# Patient Record
Sex: Female | Born: 1990
Health system: Southern US, Community
[De-identification: ages and names within clinical notes are randomized; demographics above are authoritative.]

## PROBLEM LIST (undated history)

## (undated) DIAGNOSIS — M797 Fibromyalgia: Secondary | ICD-10-CM

## (undated) DIAGNOSIS — K259 Gastric ulcer, unspecified as acute or chronic, without hemorrhage or perforation: Secondary | ICD-10-CM

## (undated) DIAGNOSIS — F909 Attention-deficit hyperactivity disorder, unspecified type: Secondary | ICD-10-CM

## (undated) DIAGNOSIS — F419 Anxiety disorder, unspecified: Secondary | ICD-10-CM

## (undated) DIAGNOSIS — K219 Gastro-esophageal reflux disease without esophagitis: Secondary | ICD-10-CM

## (undated) DIAGNOSIS — R230 Cyanosis: Secondary | ICD-10-CM

## (undated) DIAGNOSIS — H4710 Unspecified papilledema: Secondary | ICD-10-CM

## (undated) DIAGNOSIS — Z9889 Other specified postprocedural states: Secondary | ICD-10-CM

## (undated) DIAGNOSIS — F32A Depression, unspecified: Secondary | ICD-10-CM

## (undated) DIAGNOSIS — F329 Major depressive disorder, single episode, unspecified: Secondary | ICD-10-CM

## (undated) DIAGNOSIS — F429 Obsessive-compulsive disorder, unspecified: Secondary | ICD-10-CM

## (undated) DIAGNOSIS — R112 Nausea with vomiting, unspecified: Secondary | ICD-10-CM

## (undated) DIAGNOSIS — T7840XA Allergy, unspecified, initial encounter: Secondary | ICD-10-CM

## (undated) DIAGNOSIS — E282 Polycystic ovarian syndrome: Secondary | ICD-10-CM

## (undated) HISTORY — DX: Allergy, unspecified, initial encounter: T78.40XA

## (undated) HISTORY — DX: Anxiety disorder, unspecified: F41.9

---
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
## 2012-06-02 ENCOUNTER — Other Ambulatory Visit: Payer: Self-pay | Admitting: Gastroenterology

## 2012-06-07 ENCOUNTER — Encounter (HOSPITAL_COMMUNITY): Payer: Self-pay

## 2012-06-08 ENCOUNTER — Encounter (HOSPITAL_COMMUNITY): Payer: Self-pay | Admitting: Gastroenterology

## 2012-06-08 ENCOUNTER — Ambulatory Visit (HOSPITAL_COMMUNITY)
Admission: RE | Admit: 2012-06-08 | Discharge: 2012-06-08 | Disposition: A | Discharge status: Home / Self Care | Payer: BC Managed Care – PPO | Source: Ambulatory Visit | Attending: Gastroenterology | Admitting: Gastroenterology

## 2012-06-08 ENCOUNTER — Encounter (HOSPITAL_COMMUNITY)
Admission: RE | Disposition: A | Discharge status: Home / Self Care | Payer: Self-pay | Source: Ambulatory Visit | Attending: Gastroenterology

## 2012-06-08 ENCOUNTER — Encounter (HOSPITAL_COMMUNITY): Payer: Self-pay | Admitting: *Deleted

## 2012-06-08 ENCOUNTER — Encounter (HOSPITAL_COMMUNITY): Payer: Self-pay | Admitting: Anesthesiology

## 2012-06-08 ENCOUNTER — Ambulatory Visit (HOSPITAL_COMMUNITY): Payer: BC Managed Care – PPO | Admitting: Anesthesiology

## 2012-06-08 DIAGNOSIS — K921 Melena: Secondary | ICD-10-CM | POA: Insufficient documentation

## 2012-06-08 DIAGNOSIS — R1013 Epigastric pain: Secondary | ICD-10-CM | POA: Insufficient documentation

## 2012-06-08 DIAGNOSIS — K621 Rectal polyp: Secondary | ICD-10-CM | POA: Insufficient documentation

## 2012-06-08 DIAGNOSIS — K620 Anal polyp: Secondary | ICD-10-CM | POA: Insufficient documentation

## 2012-06-08 DIAGNOSIS — K644 Residual hemorrhoidal skin tags: Secondary | ICD-10-CM | POA: Insufficient documentation

## 2012-06-08 DIAGNOSIS — K648 Other hemorrhoids: Secondary | ICD-10-CM | POA: Insufficient documentation

## 2012-06-08 SURGERY — ESOPHAGOGASTRODUODENOSCOPY (EGD) WITH PROPOFOL
Anesthesia: Monitor Anesthesia Care

## 2012-06-08 MED ORDER — PROMETHAZINE HCL 25 MG/ML IJ SOLN
INTRAMUSCULAR | Status: DC | PRN
  Administered 2012-06-08: 12.5 mg via INTRAVENOUS

## 2012-06-08 MED ORDER — PROPOFOL 10 MG/ML IV BOLUS
INTRAVENOUS | Status: DC | PRN
Start: 2012-06-08 — End: 2012-06-08
  Administered 2012-06-08 (×3): 50 mg via INTRAVENOUS
  Administered 2012-06-08: 25 mg via INTRAVENOUS
  Administered 2012-06-08: 50 mg via INTRAVENOUS
  Administered 2012-06-08: 25 mg via INTRAVENOUS
  Administered 2012-06-08: 100 mg via INTRAVENOUS

## 2012-06-08 MED ORDER — SODIUM CHLORIDE 0.9 % IV SOLN
INTRAVENOUS | Status: DC | PRN
  Administered 2012-06-08: 14:00:00 via INTRAVENOUS

## 2012-06-08 MED ORDER — FENTANYL CITRATE 0.05 MG/ML IJ SOLN
INTRAMUSCULAR | Status: DC | PRN
  Administered 2012-06-08: 50 ug via INTRAVENOUS

## 2012-06-08 MED ORDER — PROMETHAZINE HCL 25 MG/ML IJ SOLN
INTRAMUSCULAR | Status: AC
  Filled 2012-06-08: qty 1

## 2012-06-08 MED ORDER — SODIUM CHLORIDE 0.9 % IV SOLN: INTRAVENOUS | Status: DC

## 2012-06-08 SURGICAL SUPPLY — 25 items

## 2012-06-08 NOTE — Op Note (Signed)
[facility] [address] [place], [address]   OPERATIVE PROCEDURE REPORT  PATIENT: Cassandra Ibarra, Cassandra Ibarra  MR#: [id number] BIRTHDATE: 1990-11-01  GENDER: Female ENDOSCOPIST: Jeani Hawking, MD ASSISTANT:   Beryle Beams, Technician Angelique Blonder, RN PROCEDURE DATE: 06/08/2012 PROCEDURE:   Colonoscopy with snare polypectomy ASA CLASS:   Class II INDICATIONS:hematochezia and heme-positive stool. MEDICATIONS: See Anesthesia Report.  DESCRIPTION OF PROCEDURE:   After the risks benefits and alternatives of the procedure were thoroughly explained, informed consent was obtained.  A digital rectal exam revealed no abnormalities of the rectum.    The Pentax Colonoscope [id number] endoscope was introduced through the anus  and advanced to the terminal ileum which was intubated for a short distance , No adverse events experienced.    The quality of the prep was good. . The instrument was then slowly withdrawn as the colon was fully examined.     COLON FINDINGS: A small 2 mm sessile polyp was removed with a cold snare from the rectum.  No evidence of any inflammation, ulcerations, erosions, or vascular abnormalities.   Retroflexed views revealed internal/external hemorrhoids.     The scope was then withdrawn from the patient and the procedure terminated.  COMPLICATIONS: There were no complications.  IMPRESSION: 1) 2 mm sessile rectal polyp.  RECOMMENDATIONS:1) Await biopsy results.   _______________________________ eSignedJeani Hawking, MD 06/08/2012 2:55 PM

## 2012-06-08 NOTE — Op Note (Signed)
[facility] [address] [place], [address]   OPERATIVE PROCEDURE REPORT  PATIENT: Cassandra Ibarra, Cassandra Ibarra  MR#: [id number] BIRTHDATE: 26-Oct-1990  GENDER: Female ENDOSCOPIST: Jeani Hawking, MD ASSISTANT:   Beryle Beams, Technician and Angelique Blonder, RN PROCEDURE DATE: 06/08/2012 PROCEDURE:   EGD, diagnostic ASA CLASS:   Class II INDICATIONS:epigastric abdominal pain. MEDICATIONS: See Anesthesia Report. TOPICAL ANESTHETIC:   none  DESCRIPTION OF PROCEDURE:   After the risks benefits and alternatives of the procedure were thoroughly explained, informed consent was obtained.  The [id number]  endoscope was introduced through the mouth  and advanced to the second portion of the duodenum Without limitations.      The instrument was slowly withdrawn as the mucosa was fully examined.      FINDINGS: In the distal esophagus there was minimal evidence of any esophagitis that may be secondary to her nausea/vomiting.  The gastric mucosa was normal, however, in the duodenal bulb there was an 8 mm to 10 mm nonbleeding clean-based ulcer.   Retroflexed views revealed no abnormalities.     The scope was then withdrawn from the patient and the procedure terminated.  COMPLICATIONS: There were no complications. IMPRESSION:  1.Ulcer(s) were found  RECOMMENDATIONS: 1) PPI BID. 2) Sucralfate QID. 3) Follow up in one month.   _______________________________ eSigned:  Jeani Hawking, MD 06/08/2012 3:02 PM

## 2012-06-08 NOTE — Anesthesia Preprocedure Evaluation (Signed)
Anesthesia Evaluation  Patient identified by MRN, date of birth, ID band Patient awake    Reviewed: Allergy & Precautions, H&P , NPO status , Patient's Chart, lab work & pertinent test results  Airway Mallampati: II TM Distance: <3 FB Neck ROM: Full    Dental No notable dental hx.    Pulmonary neg pulmonary ROS,  breath sounds clear to auscultation  Pulmonary exam normal       Cardiovascular negative cardio ROS  Rhythm:Regular Rate:Normal     Neuro/Psych negative neurological ROS  negative psych ROS   GI/Hepatic negative GI ROS, Neg liver ROS,   Endo/Other  diabetes, Oral Hypoglycemic AgentsMorbid obesity  Renal/GU negative Renal ROS  negative genitourinary   Musculoskeletal negative musculoskeletal ROS (+)   Abdominal   Peds negative pediatric ROS (+)  Hematology negative hematology ROS (+)   Anesthesia Other Findings   Reproductive/Obstetrics negative OB ROS                           Anesthesia Physical Anesthesia Plan  ASA: III  Anesthesia Plan: MAC   Post-op Pain Management:    Induction:   Airway Management Planned: Simple Face Mask  Additional Equipment:   Intra-op Plan:   Post-operative Plan:   Informed Consent: I have reviewed the patients History and Physical, chart, labs and discussed the procedure including the risks, benefits and alternatives for the proposed anesthesia with the patient or authorized representative who has indicated his/her understanding and acceptance.     Plan Discussed with: CRNA and Surgeon  Anesthesia Plan Comments:         Anesthesia Quick Evaluation

## 2012-06-08 NOTE — H&P (Signed)
  Reason for Consult: Hematochezia and epigastric pain Referring Physician: Lenard Lance, M.D.   Ledell Peoples HPI: This is a 21 year female with complaints of acute hematochezia.  Her symptoms started last week and she did not think much of it, but then she had more bleeding.  As a result of her symptoms she presented to the [facility].  Her blood work revealed a WBC of 16,000.  She was noted to have epigastric tenderness and she reports having nausea and GERD.  No known family history of Crohn's disease.  Past Medical History  Diagnosis Date  . PCOS (polycystic ovarian syndrome)   . Depression     History reviewed. No pertinent past surgical history.  History reviewed. No pertinent family history.  Social History:  reports that she has been smoking.  She does not have any smokeless tobacco history on file. She reports that she drinks alcohol. She reports that she does not use illicit drugs.  Allergies: No Known Allergies  Medications: Scheduled:   Continuous:    Results for orders placed during the hospital encounter of 06/08/12 (from the past 24 hour(s))  GLUCOSE, CAPILLARY     Status: Normal   Collection Time   06/08/12  1:57 PM      Component Value Range   Glucose-Capillary 85  70 - 99 mg/dL     No results found.  ROS:  As stated above in the HPI otherwise negative.  Blood pressure 164/113, temperature 98 F (36.7 C), temperature source Oral, resp. rate 21, height 5' 7.5" (1.715 m), weight 149.687 kg (330 lb), last menstrual period 05/23/2012, SpO2 98.00%.    PE: Gen: NAD, Alert and Oriented HEENT:  [place]/AT, EOMI Neck: Supple, no LAD Lungs: CTA Bilaterally CV: RRR without M/G/R ABM: Soft, NTND, +BS Ext: No C/C/E  Assessment/Plan: 1) Hematochezia. 2) Epigastric pain.  Plan: 1) EGD/Colonoscopy.  Doral Ventrella D 06/08/2012, 2:06 PM

## 2012-06-08 NOTE — Transfer of Care (Signed)
Immediate Anesthesia Transfer of Care Note  Patient: Cassandra Ibarra  Procedure(s) Performed: Procedure(s) (LRB) with comments: ESOPHAGOGASTRODUODENOSCOPY (EGD) WITH PROPOFOL (N/A) COLONOSCOPY WITH PROPOFOL (N/A)  Patient Location: PACU  Anesthesia Type: MAC  Level of Consciousness: awake, sedated and patient cooperative  Airway & Oxygen Therapy: Patient Spontanous Breathing and Patient connected to nasal cannula oxygen  Post-op Assessment: Report given to PACU RN and Post -op Vital signs reviewed and stable  Post vital signs: Reviewed and stable  Complications: No apparent anesthesia complications

## 2012-06-09 NOTE — Anesthesia Postprocedure Evaluation (Signed)
  Anesthesia Post-op Note  Patient: Cassandra Ibarra  Procedure(s) Performed: Procedure(s) (LRB): ESOPHAGOGASTRODUODENOSCOPY (EGD) WITH PROPOFOL (N/A) COLONOSCOPY WITH PROPOFOL (N/A)  Patient Location: PACU  Anesthesia Type: MAC  Level of Consciousness: awake and alert   Airway and Oxygen Therapy: Patient Spontanous Breathing  Post-op Pain: mild  Post-op Assessment: Post-op Vital signs reviewed, Patient's Cardiovascular Status Stable, Respiratory Function Stable, Patent Airway and No signs of Nausea or vomiting  Post-op Vital Signs: stable  Complications: No apparent anesthesia complications

## 2012-08-01 ENCOUNTER — Ambulatory Visit (INDEPENDENT_AMBULATORY_CARE_PROVIDER_SITE_OTHER): Payer: BC Managed Care – PPO | Admitting: Emergency Medicine

## 2012-08-01 ENCOUNTER — Encounter: Payer: Self-pay | Admitting: Emergency Medicine

## 2012-08-01 VITALS — BP 126/90 | HR 122 | Temp 97.7°F | Resp 20 | Ht 67.0 in | Wt 337.0 lb

## 2012-08-01 DIAGNOSIS — R230 Cyanosis: Secondary | ICD-10-CM

## 2012-08-01 DIAGNOSIS — I7300 Raynaud's syndrome without gangrene: Secondary | ICD-10-CM

## 2012-08-01 HISTORY — DX: Cyanosis: R23.0

## 2012-08-01 LAB — POCT UA - MICROSCOPIC ONLY
Casts, Ur, LPF, POC: NEGATIVE
Mucus, UA: POSITIVE
Yeast, UA: NEGATIVE

## 2012-08-01 LAB — POCT URINALYSIS DIPSTICK
Bilirubin, UA: NEGATIVE
Ketones, UA: NEGATIVE
Leukocytes, UA: NEGATIVE
Nitrite, UA: NEGATIVE
pH, UA: 5.5

## 2012-08-01 LAB — POCT CBC
HCT, POC: 44.4 % (ref 37.7–47.9)
Hemoglobin: 13.8 g/dL (ref 12.2–16.2)
MPV: 8.1 fL (ref 0–99.8)
POC Granulocyte: 5.9 (ref 2–6.9)
POC MID %: 5.8 %M (ref 0–12)
RBC: 4.98 M/uL (ref 4.04–5.48)
WBC: 12.4 10*3/uL — AB (ref 4.6–10.2)

## 2012-08-01 MED ORDER — LORAZEPAM 1 MG PO TABS: 1.0000 mg | ORAL_TABLET | Freq: Two times a day (BID) | ORAL | Status: DC | PRN

## 2012-08-01 NOTE — Progress Notes (Signed)
[facility] [address], [place] [address] [phone]  Date:  08/01/2012   Name:  Cassandra Ibarra   DOB:  05-20-1991   MRN:  [id number]  PCP:  Pcp Not In System    Chief Complaint: No chief complaint on file.   History of Present Illness:  Cassandra Ibarra is a 21 y.o. 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  She was really asymptomatic other than having anxiety.  She smoked a hookah last night but is not a habitual tobacco user.  Not using any medication in past 30 days.  Is prescribed metformin and prilosec but has not been taking them in over a month.  She has not been exposed to any chemicals.  Has no pain in her hands or fingers.  Denies any history of inflammatory joint or connective tissue disorder.  No other complaint.  No shortness of breath Has been very anxious during the recent past.  Sleeping ok.  No depression  There is no problem list on file for this patient.   Past Medical History  Diagnosis Date  . PCOS (polycystic ovarian syndrome)   . Depression     No past surgical history on file.  History  Substance Use Topics  . Smoking status: Current Some Day Smoker  . Smokeless tobacco: Not on file  . Alcohol Use: Yes     Comment: 3 beers a week     No family history on file.  No Known Allergies  Medication list has been reviewed and updated.  Current Outpatient Prescriptions on File Prior to Visit  Medication Sig Dispense Refill  . metFORMIN (GLUCOPHAGE) 1000 MG tablet Take 1,000 mg by mouth 2 (two) times daily with a meal.      . promethazine (PHENERGAN) 12.5 MG tablet Take 12.5 mg by mouth every 6 (six) hours as needed.        Review of Systems:  As per HPI, otherwise negative.    Physical Examination: There were no vitals filed for this visit. There were no vitals filed for this visit. There is no height or weight on file  to calculate BMI. Ideal Body Weight:    GEN: WDWN, NAD, Non-toxic, A & O x 3 HEENT: Atraumatic, Normocephalic. Neck supple. No masses, No LAD. Ears and Nose: No external deformity. CV: RRR, No M/G/R. No JVD. No thrill. No extra heart sounds. PULM: CTA B, no wheezes, crackles, rhonchi. No retractions. No resp. distress. No accessory muscle use. ABD: S, NT, ND, +BS. No rebound. No HSM. EXTR: No c/e.  Cyanosis of hands from mid palm distally and of forefoot and toes. NEURO Normal gait.  PSYCH: Normally interactive. Conversant. Not depressed or anxious appearing.  Calm demeanor.    Assessment and Plan: Raynaud's phenomena anxiety Labs Refer to rheumatology  Patient's cyanosis improved dramatically with exposure to warm water.  Carmelina Dane, MD

## 2012-08-01 NOTE — Patient Instructions (Addendum)
Raynaud's Syndrome  Raynaud's Syndrome is a disorder of the blood vessels in your hands and feet. It occurs when small arteries of the arms/hands or legs/feet become sensitive to cold or emotional upset. This causes the arteries to constrict, or narrow, and reduces blood flow to the area. The color in the fingers or toes changes from white to bluish to red and this is not usually painful. There may be numbness and tingling. Sores on the skin (ulcers) can form. Symptoms are usually relieved by warming.  HOME CARE INSTRUCTIONS     Avoid exposure to cold. Keep your whole body warm and dry. Dress in layers. Wear mittens or gloves when handling ice or frozen food and when outdoors. Use holders for glasses or cans containing cold drinks. If possible, stay indoors during cold weather.   Limit your use of caffeine. Switch to decaffeinated coffee, tea, and soda pop. Avoid chocolate.   Avoid smoking or being around cigarette smoke. Smoke will make symptoms worse.   Wear loose fitting socks and comfortable, roomy shoes.   Avoid vibrating tools and machinery.   If possible, avoid stressful and emotional situations. Exercise, meditation and yoga may help you cope with stress. Biofeedback may be useful.   Ask your caregiver about medicine (calcium channel blockers) that may control Raynaud's phenomena.  SEEK MEDICAL CARE IF:     Your discomfort becomes worse, despite conservative treatment.   You develop sores on your fingers and toes that do not heal.  Document Released: 08/15/2000 Document Revised: 11/10/2011 Document Reviewed: 08/22/2008  [employer] Patient Information 2013 [employer]

## 2012-08-02 ENCOUNTER — Telehealth: Payer: Self-pay

## 2012-08-02 LAB — COMPREHENSIVE METABOLIC PANEL
ALT: 27 U/L (ref 0–35)
Albumin: 4.4 g/dL (ref 3.5–5.2)
CO2: 23 mEq/L (ref 19–32)
Calcium: 9.4 mg/dL (ref 8.4–10.5)
Chloride: 106 mEq/L (ref 96–112)
Glucose, Bld: 91 mg/dL (ref 70–99)
Sodium: 139 mEq/L (ref 135–145)
Total Protein: 7.4 g/dL (ref 6.0–8.3)

## 2012-08-02 LAB — RHEUMATOID FACTOR: Rhuematoid fact SerPl-aCnc: <10 IU/mL (ref <=14)

## 2012-08-02 NOTE — Telephone Encounter (Signed)
I have spoken to patient she states she now has tingling in both her hands. She states her hands are not red or swollen/ she had her mother look at them as well. She is advised if worse to return to clinic, she agrees to do this. Please advise if anything else needed other than referral to rheumatology. Amy

## 2012-08-02 NOTE — Telephone Encounter (Signed)
PATIENT WAS SEEN YESTERDAY AND HAS NEW SYMPTOMS. HANDS ARE NOW NOT BLUE ANYMORE. A VEIN IN BOTH ARMS FEEL TINGLY AND IS HOT.  [phone]. PLEASE CALL IMMEDIATELY PATIENT REQUEST TO SPEAK TO A NURSE OR SOMEONE CLINICAL IN REGARDS TO SYMPTOMS. THANK YOU!

## 2012-08-03 LAB — ANA: Anti Nuclear Antibody(ANA): NEGATIVE

## 2012-08-05 ENCOUNTER — Telehealth: Payer: Self-pay

## 2012-08-05 NOTE — Telephone Encounter (Signed)
Patient has the bloods completed.

## 2012-08-05 NOTE — Telephone Encounter (Signed)
Patient needs to have the following labs before [facility] will schedule the patient for our referral:   C3, C4, ruematoid factor, ANA [phone]

## 2012-08-05 NOTE — Telephone Encounter (Signed)
Pt is calling about lab results  

## 2012-08-06 ENCOUNTER — Telehealth: Payer: Self-pay

## 2012-08-06 NOTE — Telephone Encounter (Signed)
Spoke w/pt and gave her lab results and faxed copy to her rheumatologist.

## 2012-08-06 NOTE — Telephone Encounter (Signed)
Pt had all these labs completed, and I spoke w/pt to give her results and faxed copy to rheumatologist.

## 2012-08-06 NOTE — Telephone Encounter (Signed)
Pt has questions about her referral and blood work she is supposed to be doing would like a call back from a nurse [phone]

## 2012-08-09 NOTE — Progress Notes (Signed)
Reviewed and agree.

## 2012-08-13 ENCOUNTER — Emergency Department (HOSPITAL_COMMUNITY): Payer: BC Managed Care – PPO

## 2012-08-13 ENCOUNTER — Emergency Department (HOSPITAL_COMMUNITY)
Admission: EM | Admit: 2012-08-13 | Discharge: 2012-08-13 | Disposition: A | Discharge status: Home / Self Care | Payer: BC Managed Care – PPO | Attending: Emergency Medicine | Admitting: Emergency Medicine

## 2012-08-13 ENCOUNTER — Other Ambulatory Visit: Payer: Self-pay

## 2012-08-13 ENCOUNTER — Encounter (HOSPITAL_COMMUNITY): Payer: Self-pay | Admitting: Emergency Medicine

## 2012-08-13 DIAGNOSIS — Z79899 Other long term (current) drug therapy: Secondary | ICD-10-CM | POA: Insufficient documentation

## 2012-08-13 DIAGNOSIS — R5381 Other malaise: Secondary | ICD-10-CM | POA: Insufficient documentation

## 2012-08-13 DIAGNOSIS — Z8742 Personal history of other diseases of the female genital tract: Secondary | ICD-10-CM | POA: Insufficient documentation

## 2012-08-13 DIAGNOSIS — G43909 Migraine, unspecified, not intractable, without status migrainosus: Secondary | ICD-10-CM | POA: Insufficient documentation

## 2012-08-13 DIAGNOSIS — R110 Nausea: Secondary | ICD-10-CM | POA: Insufficient documentation

## 2012-08-13 DIAGNOSIS — R5383 Other fatigue: Secondary | ICD-10-CM | POA: Insufficient documentation

## 2012-08-13 DIAGNOSIS — F17200 Nicotine dependence, unspecified, uncomplicated: Secondary | ICD-10-CM | POA: Insufficient documentation

## 2012-08-13 DIAGNOSIS — Z872 Personal history of diseases of the skin and subcutaneous tissue: Secondary | ICD-10-CM | POA: Insufficient documentation

## 2012-08-13 DIAGNOSIS — R51 Headache: Secondary | ICD-10-CM | POA: Insufficient documentation

## 2012-08-13 DIAGNOSIS — Z8659 Personal history of other mental and behavioral disorders: Secondary | ICD-10-CM | POA: Insufficient documentation

## 2012-08-13 DIAGNOSIS — R230 Cyanosis: Secondary | ICD-10-CM | POA: Insufficient documentation

## 2012-08-13 LAB — COMPREHENSIVE METABOLIC PANEL
BUN: 10 mg/dL (ref 6–23)
CO2: 23 mEq/L (ref 19–32)
Calcium: 9.6 mg/dL (ref 8.4–10.5)
Creatinine, Ser: 0.7 mg/dL (ref 0.50–1.10)
GFR calc Af Amer: >90 mL/min (ref >90)
GFR calc non Af Amer: >90 mL/min (ref >90)
Glucose, Bld: 86 mg/dL (ref 70–99)

## 2012-08-13 LAB — CBC WITH DIFFERENTIAL/PLATELET
Eosinophils Relative: 3 % (ref 0–5)
HCT: 38.8 % (ref 36.0–46.0)
Lymphocytes Relative: 42 % (ref 12–46)
Lymphs Abs: 5.2 10*3/uL — ABNORMAL HIGH (ref 0.7–4.0)
MCV: 82.9 fL (ref 78.0–100.0)
Monocytes Absolute: 0.7 10*3/uL (ref 0.1–1.0)
Monocytes Relative: 5 % (ref 3–12)
RBC: 4.68 MIL/uL (ref 3.87–5.11)
RDW: 12.9 % (ref 11.5–15.5)
WBC: 12.5 10*3/uL — ABNORMAL HIGH (ref 4.0–10.5)

## 2012-08-13 MED ORDER — KETOROLAC TROMETHAMINE 30 MG/ML IJ SOLN
30.0000 mg | Freq: Once | INTRAMUSCULAR | Status: AC
  Administered 2012-08-13: 30 mg via INTRAVENOUS
  Filled 2012-08-13: qty 1

## 2012-08-13 MED ORDER — IOHEXOL 350 MG/ML SOLN
100.0000 mL | Freq: Once | INTRAVENOUS | Status: AC | PRN
  Administered 2012-08-13: 100 mL via INTRAVENOUS

## 2012-08-13 MED ORDER — ONDANSETRON HCL 4 MG/2ML IJ SOLN
4.0000 mg | Freq: Once | INTRAMUSCULAR | Status: AC
  Administered 2012-08-13: 4 mg via INTRAVENOUS
  Filled 2012-08-13: qty 2

## 2012-08-13 MED ORDER — METOCLOPRAMIDE HCL 5 MG/ML IJ SOLN
10.0000 mg | INTRAMUSCULAR | Status: AC
  Administered 2012-08-13: 10 mg via INTRAVENOUS
  Filled 2012-08-13: qty 2

## 2012-08-13 MED ORDER — DIPHENHYDRAMINE HCL 50 MG/ML IJ SOLN
25.0000 mg | Freq: Once | INTRAMUSCULAR | Status: AC
  Administered 2012-08-13: 25 mg via INTRAVENOUS
  Filled 2012-08-13: qty 1

## 2012-08-13 MED ORDER — MORPHINE SULFATE 4 MG/ML IJ SOLN
4.0000 mg | Freq: Once | INTRAMUSCULAR | Status: AC
Start: 2012-08-13 — End: 2012-08-13
  Administered 2012-08-13: 4 mg via INTRAVENOUS
  Filled 2012-08-13: qty 1

## 2012-08-13 MED ORDER — HYDROCODONE-ACETAMINOPHEN 5-325 MG PO TABS: 1.0000 | ORAL_TABLET | ORAL | Status: DC | PRN

## 2012-08-13 MED ORDER — IOHEXOL 300 MG/ML  SOLN: 20.0000 mL | INTRAMUSCULAR | Status: AC

## 2012-08-13 NOTE — ED Notes (Signed)
Fingers are pale and do not blanch hands have  Good radial pulse to both hand can wiggle fingers

## 2012-08-13 NOTE — ED Provider Notes (Signed)
Medical screening examination/treatment/procedure(s) were performed by non-physician practitioner and as supervising physician I was immediately available for consultation/collaboration.   Charles B. Bernette Mayers, MD 08/13/12 2244

## 2012-08-13 NOTE — ED Notes (Addendum)
PT reports hxt of migraines and duodenal ulcer. Reports current migraine for the past 24 hrs. Light sensitivity. Reports nausea.  Denies headache post morphine. States "there is no pain, but I feel constant pressure". Patient's finger tips and toes are blue bilaterally. States they are not painful after receiving morphine. Denies SOB. Denies Chest pain. Denies N/V/D. Reports being under increased stress related to final exams. Family at bedside. Updated on plan of care. Aware.

## 2012-08-13 NOTE — ED Provider Notes (Signed)
History     CSN: [id number]  Arrival date & time 08/13/12  1429   First MD Initiated Contact with Patient 08/13/12 2129      No chief complaint on file.   (Consider location/radiation/quality/duration/timing/severity/associated sxs/prior treatment) The history is provided by the patient, a parent, a relative, a friend and medical records.    Cassandra Ibarra is a 21 y.o. female  with a hx of PCOS, Depression, migraine presents to the Emergency Department complaining of gradual, intermittent, progressively worsening onset Dec 1st.  The first episode resolved within 2 hours.  Pt has had 3-4 episodes per week since that time.  Associated symptoms include pain in the hands, headaches, lightheaded.  Nothing makes it better and nothing makes it worse.  Pt denies fever, chills, chest pain, shortness of breath, abdominal pain, nausea, vomiting, diarrhea, constipation, dysuria, hematuria, frequency, urgency, syncope, palpitations.  Pt was seen on 08/01/12 for this and dx with Raynaud's.  She was then referred to Rheum on who she saw on Wed. He said not Raynaud's and suggested cardiology.  She was seen at [facility] cardiology for an echo but has not gotten the results.   Pt also c/o migraine since yesterday same as the ones she has had in the past month. Normal migraine in the back of the head; these now begin at the hairline and is on the R for the past month.  Pt also c/o generalized weakness, not correlated in the migraines.    Past Medical History  Diagnosis Date  . PCOS (polycystic ovarian syndrome)   . Depression     History reviewed. No pertinent past surgical history.  No family history on file.  History  Substance Use Topics  . Smoking status: Current Some Day Smoker  . Smokeless tobacco: Not on file  . Alcohol Use: Yes     Comment: 3 beers a week     OB History    Grav Para Term Preterm Abortions TAB SAB Ect Mult Living                  Review of Systems  Constitutional:  Positive for fatigue. Negative for fever, diaphoresis, appetite change and unexpected weight change.  HENT: Negative for mouth sores and neck stiffness.   Eyes: Negative for visual disturbance.  Respiratory: Negative for cough, chest tightness, shortness of breath and wheezing.   Cardiovascular: Negative for chest pain.  Gastrointestinal: Negative for nausea, vomiting, abdominal pain, diarrhea and constipation.  Genitourinary: Negative for dysuria, urgency, frequency and hematuria.  Skin: Negative for rash.       Blue hands  Neurological: Positive for headaches. Negative for syncope and light-headedness.  Psychiatric/Behavioral: Negative for sleep disturbance. The patient is not nervous/anxious.   All other systems reviewed and are negative.    Allergies  Lorazepam  Home Medications   Current Outpatient Rx  Name  Route  Sig  Dispense  Refill  . ACETAMINOPHEN-ASPIRIN BUFFERED 250-250 MG PO TABS   Oral   Take 1 tablet by mouth every 4 (four) hours as needed. For pain         . ASPIRIN-ACETAMINOPHEN-CAFFEINE 250-250-65 MG PO TABS   Oral   Take 1 tablet by mouth every 6 (six) hours as needed. For migraines           BP 130/84  Pulse 110  Temp 97.4 F (36.3 C) (Oral)  Resp 16  SpO2 98%  LMP 07/24/2012  Physical Exam  Nursing note and vitals reviewed. Constitutional: She  is oriented to person, place, and time. She appears well-developed and well-nourished. No distress.  HENT:  Head: Normocephalic and atraumatic.  Right Ear: Tympanic membrane, external ear and ear canal normal.  Left Ear: Tympanic membrane, external ear and ear canal normal.  Nose: Nose normal. Right sinus exhibits no maxillary sinus tenderness and no frontal sinus tenderness. Left sinus exhibits no maxillary sinus tenderness and no frontal sinus tenderness.  Mouth/Throat: Uvula is midline, oropharynx is clear and moist and mucous membranes are normal. No oropharyngeal exudate, posterior oropharyngeal  edema, posterior oropharyngeal erythema or tonsillar abscesses.  Eyes: Conjunctivae normal and EOM are normal. Pupils are equal, round, and reactive to light. No scleral icterus.  Neck: Normal range of motion. Neck supple.  Cardiovascular: Regular rhythm, normal heart sounds and intact distal pulses.  Tachycardia present.  Exam reveals no gallop and no friction rub.   No murmur heard. Pulses:      Radial pulses are 2+ on the right side, and 2+ on the left side.       Dorsalis pedis pulses are 2+ on the right side, and 2+ on the left side.       Posterior tibial pulses are 2+ on the right side, and 2+ on the left side.  Pulmonary/Chest: Effort normal and breath sounds normal. No respiratory distress. She has no decreased breath sounds. She has no wheezes. She has no rhonchi. She has no rales. She exhibits no tenderness.  Abdominal: Soft. Bowel sounds are normal. She exhibits no distension and no mass. There is no tenderness. There is no rebound and no guarding.  Musculoskeletal: Normal range of motion. She exhibits no edema and no tenderness.  Lymphadenopathy:    She has no cervical adenopathy.  Neurological: She is alert and oriented to person, place, and time.       Speech is clear and goal oriented, follows commands Major Cranial nerves without deficit, no facial droop Normal strength in upper and lower extremities bilaterally including dorsiflexion and plantar flexion, strong and equal grip strength Sensation normal to light and sharp touch Moves extremities without ataxia, coordination intact Normal finger to nose and rapid alternating movements Neg romberg, no pronator drift Normal gait Normal heel-shin and balance  Skin: Skin is warm and dry. No rash noted. She is not diaphoretic. No erythema.       Mild cyanosis in patches in the hands and feet, hands and feet warm to touch, capillary refill less than 3 seconds, strong pulses palpable in all extremities  Psychiatric: She has a normal  mood and affect.    ED Course  Procedures (including critical care time)  Labs Reviewed  CBC WITH DIFFERENTIAL - Abnormal; Notable for the following:    WBC 12.5 (*)     Lymphs Abs 5.2 (*)     All other components within normal limits  COMPREHENSIVE METABOLIC PANEL - Abnormal; Notable for the following:    Total Bilirubin 0.2 (*)     All other components within normal limits  TROPONIN I   Dg Chest 2 View  08/13/2012  *RADIOLOGY REPORT*  Clinical Data: Chest pain.  CHEST - 2 VIEW  Comparison: October 18, 2011.  Findings: Cardiomediastinal silhouette appears normal.  No acute pulmonary disease is noted.  Bony thorax is intact.  IMPRESSION: No acute cardiopulmonary abnormality seen.   Original Report Authenticated By: Lupita Raider.,  M.D.    Ct Angio Chest W/cm &/or Wo Cm  08/13/2012  *RADIOLOGY REPORT*  Clinical Data: Bilateral upper  extremity pain and cyanosis. Clinical suspicion for thoracic aortic dissection.  CT ANGIOGRAPHY CHEST  Technique:  Multidetector CT imaging of the chest using the standard protocol during bolus administration of intravenous contrast. Multiplanar reconstructed images including MIPs were obtained and reviewed to evaluate the vascular anatomy.  Contrast: OMNIPAQUE IOHEXOL 350 MG/ML SOLN  Comparison: None.  Findings: No evidence of thoracic aortic aneurysm or dissection. No evidence of mediastinal hematoma or mass.  Residual thymic tissue is noted in the anterior mediastinum.  No lymphadenopathy identified within the thorax.  No evidence of pleural or pericardial effusion.  No pulmonary emboli identified.  No evidence of pulmonary infiltrate or mass. No central endobronchial lesion identified.  IMPRESSION: Negative.  No evidence of thoracic aortic aneurysm, dissection, or other significant abnormality.   Original Report Authenticated By: Myles Rosenthal, M.D.    ECG  Date: 08/13/2012  Rate: 128  Rhythm: sinus tachycardia  QRS Axis: normal  Intervals: normal   ST/T Wave abnormalities: nonspecific T wave changes  Conduction Disutrbances:none  Narrative Interpretation: inverted T waves V3-V6  Old EKG Reviewed: none available     1. Vasomotor cyanosis   2. Headache       MDM  Ledell Peoples presents complaining of sinuses in her hands and feet since December 1. Room or gout without results therefore she presented here for cardiac workup rheumatologist due to concern for pulmonary embolism.  ECG nonischemic, CMP within normal limits, troponin negative, CBC with mildly elevated white blood cell count of 12.5 which appears to be patient's baseline based on previous labs. CT angiogram without evidence of thoracic aortic aneurysm dissection or pulmonary embolism. Chest x-ray within normal limits. Normal neurological exam.  Mild cyanosis of the hands and feet with good pulses and sensation.  Oxygen saturation within normal limits throughout visit.  Discussed these findings at length with patient and their family. Discussed at this time there is not an emergent condition found. Suggested continued followup with rheumatology. It will treat her migraine with migraine cocktail and discharge home with Vicodin. Also suggested followup for her migraine prophylaxis and further treatments.  I have also discussed reasons to return immediately to the ER.  Patient expresses understanding and agrees with plan.  1. Medications: vicodin, usual home medications 2. Treatment: rest, drink plenty of fluids, take medications as prescribed 3. Follow Up: Please followup with your primary doctor for discussion of your diagnoses and further evaluation after today's visit; if you do not have a primary care doctor use the resource guide provided to find one; followup with rheumatology        Dierdre Forth, PA-C 08/13/12 2243

## 2012-08-13 NOTE — ED Notes (Signed)
Pt states her hands have been turing light blue since dec 1  She can use them but they hurt random ly and she has had h/a since last night and weak since yesterday. Has had a lot of blood work done she is here today for ct angio

## 2012-08-13 NOTE — ED Provider Notes (Signed)
MSE was initiated and I personally evaluated the patient and placed orders (if any) at  7:47 PM on August 13, 2012.  The patient appears stable so that the remainder of the MSE may be completed by another provider.  Pt with cyanosis to bilateral hands.  Was initially felt to be Raynauds, but was seen by Rheumatologist who arranged for echo and based on that, felt that is might be cardiac in origin.  She states that they were concerned about the cardiac vessels based on the echo.  Was recommended to have a CT angio.  Rolan Bucco, MD 08/13/12 1949

## 2013-09-01 HISTORY — PX: WISDOM TOOTH EXTRACTION: SHX21

## 2013-12-21 ENCOUNTER — Emergency Department (HOSPITAL_COMMUNITY)
Admission: EM | Admit: 2013-12-21 | Discharge: 2013-12-21 | Disposition: A | Discharge status: Home / Self Care | Payer: Self-pay | Source: Home / Self Care | Attending: Family Medicine | Admitting: Family Medicine

## 2013-12-21 ENCOUNTER — Encounter (HOSPITAL_COMMUNITY): Payer: Self-pay | Admitting: Emergency Medicine

## 2013-12-21 DIAGNOSIS — M545 Low back pain, unspecified: Secondary | ICD-10-CM

## 2013-12-21 MED ORDER — HYDROCODONE-ACETAMINOPHEN 5-325 MG PO TABS: 1.0000 | ORAL_TABLET | Freq: Four times a day (QID) | ORAL | Status: DC | PRN

## 2013-12-21 MED ORDER — CYCLOBENZAPRINE HCL 5 MG PO TABS: 5.0000 mg | ORAL_TABLET | Freq: Every evening | ORAL | Status: DC | PRN

## 2013-12-21 MED ORDER — PROMETHAZINE HCL 25 MG PO TABS: 25.0000 mg | ORAL_TABLET | Freq: Four times a day (QID) | ORAL | Status: DC | PRN

## 2013-12-21 MED ORDER — HYDROCODONE-ACETAMINOPHEN 5-325 MG PO TABS
ORAL_TABLET | ORAL | Status: AC
  Filled 2013-12-21: qty 2

## 2013-12-21 MED ORDER — PREDNISONE 10 MG PO KIT: PACK | ORAL | Status: DC

## 2013-12-21 MED ORDER — HYDROCODONE-ACETAMINOPHEN 5-325 MG PO TABS
2.0000 | ORAL_TABLET | Freq: Once | ORAL | Status: AC
  Administered 2013-12-21: 2 via ORAL

## 2013-12-21 NOTE — ED Provider Notes (Signed)
Cassandra Ibarra is a 23 y.o. female who presents to Urgent Care today for low back pain. Patient has experienced one month of mild intermittent low back pain. However today she felt a sudden onset of: Sensation in her low back when bent over. The pain is worsened rapidly. She notes severe low back pain. She denies any radiating pain weakness or numbness. She denies any bowel or bladder problems. She's tried 600 mg of ibuprofen which has not helped. No fevers chills nausea vomiting or diarrhea.   Past Medical History  Diagnosis Date  . PCOS (polycystic ovarian syndrome)   . Depression    History  Substance Use Topics  . Smoking status: Current Some Day Smoker  . Smokeless tobacco: Not on file  . Alcohol Use: Yes     Comment: 3 beers a week    ROS as above Medications: No current facility-administered medications for this encounter.   Current Outpatient Prescriptions  Medication Sig Dispense Refill  . Acetaminophen-Aspirin Buffered (EXCEDRIN BACK & BODY) 250-250 MG tablet Take 1 tablet by mouth every 4 (four) hours as needed. For pain      . aspirin-acetaminophen-caffeine (EXCEDRIN MIGRAINE) 250-250-65 MG per tablet Take 1 tablet by mouth every 6 (six) hours as needed. For migraines      . cyclobenzaprine (FLEXERIL) 5 MG tablet Take 1 tablet (5 mg total) by mouth at bedtime as needed for muscle spasms.  20 tablet  0  . HYDROcodone-acetaminophen (NORCO/VICODIN) 5-325 MG per tablet Take 1 tablet by mouth every 6 (six) hours as needed.  15 tablet  0  . PredniSONE 10 MG KIT 12 day dose pack po  1 kit  0  . promethazine (PHENERGAN) 25 MG tablet Take 1 tablet (25 mg total) by mouth every 6 (six) hours as needed for nausea or vomiting.  30 tablet  0    Exam:  BP 135/82  Pulse 97  Temp(Src) 98.4 F (36.9 C) (Oral)  Resp 20  SpO2 98%  LMP 11/23/2013 Gen: Well NAD morbidly obese HEENT: EOMI,  MMM Lungs: Normal work of breathing. CTABL Heart: RRR no MRG Abd: NABS, Soft. NT, ND Exts:  Brisk capillary refill, warm and well perfused.  Back: Nontender to spinal midline. Tender palpation left lumbar paraspinal. Negative straight leg raise test bilaterally. Lower 70 strength is intact. Reflexes are intact bilaterally and normal. Capillary refill is intact throughout.  No results found for this or any previous visit (from the past 24 hour(s)). No results found.  Assessment and Plan: 23 y.o. female with lumbago due to myofascial disruption. Plan to treat with prednisone, Norco, Flexeril. Refer to physical therapy. Work note provided.  Discussed warning signs or symptoms. Please see discharge instructions. Patient expresses understanding.    Gregor Hams, MD 12/21/13 [phone]

## 2013-12-21 NOTE — ED Notes (Signed)
Pt reports "sever" back pain onset 4 hours ago while at work States she's been having mild back pain x1 month Reports she bent over to push a cabinet on wheels when she felt the pain  Denies abd pain, urinary sx Pt brought back in wheel chair Alert w/no signs of acute distress.

## 2013-12-21 NOTE — Discharge Instructions (Signed)
Thank you for coming in today. Take prednisone as directed for 12 days.  Use Flexeril and Norco for pain as needed.  Use Phenergan as needed.  Followup with physical therapy.  Come back or go to the emergency room if you notice new weakness new numbness problems walking or bowel or bladder problems.  Back Exercises These exercises may help you when beginning to rehabilitate your injury. Your symptoms may resolve with or without further involvement from your physician, physical therapist or athletic trainer. While completing these exercises, remember:   Restoring tissue flexibility helps normal motion to return to the joints. This allows healthier, less painful movement and activity.  An effective stretch should be held for at least 30 seconds.  A stretch should never be painful. You should only feel a gentle lengthening or release in the stretched tissue. STRETCH  Extension, Prone on Elbows   Lie on your stomach on the floor, a bed will be too soft. Place your palms about shoulder width apart and at the height of your head.  Place your elbows under your shoulders. If this is too painful, stack pillows under your chest.  Allow your body to relax so that your hips drop lower and make contact more completely with the floor.  Hold this position for __________ seconds.  Slowly return to lying flat on the floor. Repeat __________ times. Complete this exercise __________ times per day.  RANGE OF MOTION  Extension, Prone Press Ups   Lie on your stomach on the floor, a bed will be too soft. Place your palms about shoulder width apart and at the height of your head.  Keeping your back as relaxed as possible, slowly straighten your elbows while keeping your hips on the floor. You may adjust the placement of your hands to maximize your comfort. As you gain motion, your hands will come more underneath your shoulders.  Hold this position __________ seconds.  Slowly return to lying flat on the  floor. Repeat __________ times. Complete this exercise __________ times per day.  RANGE OF MOTION- Quadruped, Neutral Spine   Assume a hands and knees position on a firm surface. Keep your hands under your shoulders and your knees under your hips. You may place padding under your knees for comfort.  Drop your head and point your tail bone toward the ground below you. This will round out your low back like an angry cat. Hold this position for __________ seconds.  Slowly lift your head and release your tail bone so that your back sags into a large arch, like an old horse.  Hold this position for __________ seconds.  Repeat this until you feel limber in your low back.  Now, find your "sweet spot." This will be the most comfortable position somewhere between the two previous positions. This is your neutral spine. Once you have found this position, tense your stomach muscles to support your low back.  Hold this position for __________ seconds. Repeat __________ times. Complete this exercise __________ times per day.  STRETCH  Flexion, Single Knee to Chest   Lie on a firm bed or floor with both legs extended in front of you.  Keeping one leg in contact with the floor, bring your opposite knee to your chest. Hold your leg in place by either grabbing behind your thigh or at your knee.  Pull until you feel a gentle stretch in your low back. Hold __________ seconds.  Slowly release your grasp and repeat the exercise with the opposite  side. Repeat __________ times. Complete this exercise __________ times per day.  STRETCH - Hamstrings, Standing  Stand or sit and extend your right / left leg, placing your foot on a chair or foot stool  Keeping a slight arch in your low back and your hips straight forward.  Lead with your chest and lean forward at the waist until you feel a gentle stretch in the back of your right / left knee or thigh. (When done correctly, this exercise requires leaning only a  small distance.)  Hold this position for __________ seconds. Repeat __________ times. Complete this stretch __________ times per day. STRENGTHENING  Deep Abdominals, Pelvic Tilt   Lie on a firm bed or floor. Keeping your legs in front of you, bend your knees so they are both pointed toward the ceiling and your feet are flat on the floor.  Tense your lower abdominal muscles to press your low back into the floor. This motion will rotate your pelvis so that your tail bone is scooping upwards rather than pointing at your feet or into the floor.  With a gentle tension and even breathing, hold this position for __________ seconds. Repeat __________ times. Complete this exercise __________ times per day.  STRENGTHENING  Abdominals, Crunches   Lie on a firm bed or floor. Keeping your legs in front of you, bend your knees so they are both pointed toward the ceiling and your feet are flat on the floor. Cross your arms over your chest.  Slightly tip your chin down without bending your neck.  Tense your abdominals and slowly lift your trunk high enough to just clear your shoulder blades. Lifting higher can put excessive stress on the low back and does not further strengthen your abdominal muscles.  Control your return to the starting position. Repeat __________ times. Complete this exercise __________ times per day.  STRENGTHENING  Quadruped, Opposite UE/LE Lift   Assume a hands and knees position on a firm surface. Keep your hands under your shoulders and your knees under your hips. You may place padding under your knees for comfort.  Find your neutral spine and gently tense your abdominal muscles so that you can maintain this position. Your shoulders and hips should form a rectangle that is parallel with the floor and is not twisted.  Keeping your trunk steady, lift your right hand no higher than your shoulder and then your left leg no higher than your hip. Make sure you are not holding your  breath. Hold this position __________ seconds.  Continuing to keep your abdominal muscles tense and your back steady, slowly return to your starting position. Repeat with the opposite arm and leg. Repeat __________ times. Complete this exercise __________ times per day. Document Released: 09/05/2005 Document Revised: 11/10/2011 Document Reviewed: 11/30/2008 [facility] Patient Information 2014 [place], [place].

## 2014-02-08 ENCOUNTER — Other Ambulatory Visit: Payer: Self-pay | Admitting: Gastroenterology

## 2014-02-09 ENCOUNTER — Encounter (HOSPITAL_COMMUNITY)
Admission: RE | Disposition: A | Discharge status: Home / Self Care | Payer: Self-pay | Source: Ambulatory Visit | Attending: Gastroenterology

## 2014-02-09 ENCOUNTER — Encounter (HOSPITAL_COMMUNITY): Payer: BC Managed Care – PPO | Admitting: Certified Registered"

## 2014-02-09 ENCOUNTER — Ambulatory Visit (HOSPITAL_COMMUNITY): Payer: BC Managed Care – PPO | Admitting: Certified Registered"

## 2014-02-09 ENCOUNTER — Ambulatory Visit (HOSPITAL_COMMUNITY)
Admission: RE | Admit: 2014-02-09 | Discharge: 2014-02-09 | Disposition: A | Discharge status: Home / Self Care | Payer: BC Managed Care – PPO | Source: Ambulatory Visit | Attending: Gastroenterology | Admitting: Gastroenterology

## 2014-02-09 ENCOUNTER — Encounter (HOSPITAL_COMMUNITY): Payer: Self-pay

## 2014-02-09 ENCOUNTER — Ambulatory Visit (HOSPITAL_COMMUNITY): Admit: 2014-02-09 | Payer: Self-pay | Admitting: Gastroenterology

## 2014-02-09 DIAGNOSIS — K269 Duodenal ulcer, unspecified as acute or chronic, without hemorrhage or perforation: Secondary | ICD-10-CM | POA: Insufficient documentation

## 2014-02-09 DIAGNOSIS — F3289 Other specified depressive episodes: Secondary | ICD-10-CM | POA: Insufficient documentation

## 2014-02-09 DIAGNOSIS — F329 Major depressive disorder, single episode, unspecified: Secondary | ICD-10-CM | POA: Insufficient documentation

## 2014-02-09 DIAGNOSIS — E282 Polycystic ovarian syndrome: Secondary | ICD-10-CM | POA: Insufficient documentation

## 2014-02-09 DIAGNOSIS — Z888 Allergy status to other drugs, medicaments and biological substances status: Secondary | ICD-10-CM | POA: Insufficient documentation

## 2014-02-09 HISTORY — DX: Cyanosis: R23.0

## 2014-02-09 HISTORY — DX: Nausea with vomiting, unspecified: Z98.890

## 2014-02-09 HISTORY — PX: ESOPHAGOGASTRODUODENOSCOPY (EGD) WITH PROPOFOL: SHX5813

## 2014-02-09 HISTORY — DX: Nausea with vomiting, unspecified: R11.2

## 2014-02-09 SURGERY — ESOPHAGOGASTRODUODENOSCOPY (EGD) WITH PROPOFOL
Anesthesia: Monitor Anesthesia Care

## 2014-02-09 SURGERY — EGD (ESOPHAGOGASTRODUODENOSCOPY)
Anesthesia: Moderate Sedation

## 2014-02-09 MED ORDER — SODIUM CHLORIDE 0.9 % IV SOLN
INTRAVENOUS | Status: DC
  Administered 2014-02-09 (×2): via INTRAVENOUS

## 2014-02-09 MED ORDER — PROPOFOL INFUSION 10 MG/ML OPTIME
INTRAVENOUS | Status: DC | PRN
  Administered 2014-02-09: 160 ug/kg/min via INTRAVENOUS

## 2014-02-09 MED ORDER — BUTAMBEN-TETRACAINE-BENZOCAINE 2-2-14 % EX AERO
INHALATION_SPRAY | CUTANEOUS | Status: DC | PRN
  Administered 2014-02-09: 1 via TOPICAL

## 2014-02-09 NOTE — Transfer of Care (Signed)
Immediate Anesthesia Transfer of Care Note  Patient: Cassandra Ibarra  Procedure(s) Performed: Procedure(s): ESOPHAGOGASTRODUODENOSCOPY (EGD) WITH PROPOFOL (N/A)  Patient Location: PACU  Anesthesia Type:MAC  Level of Consciousness: awake, alert , oriented and patient cooperative  Airway & Oxygen Therapy: Patient Spontanous Breathing and Patient connected to nasal cannula oxygen  Post-op Assessment: Report given to PACU RN, Post -op Vital signs reviewed and stable and Patient moving all extremities  Post vital signs: Reviewed and stable  Complications: No apparent anesthesia complications

## 2014-02-09 NOTE — Anesthesia Preprocedure Evaluation (Signed)
Anesthesia Evaluation  Patient identified by MRN, date of birth, ID band Patient awake    History of Anesthesia Complications (+) PONV  Airway Mallampati: II      Dental   Pulmonary          Cardiovascular     Neuro/Psych    GI/Hepatic negative GI ROS, Neg liver ROS,   Endo/Other    Renal/GU negative Renal ROS     Musculoskeletal   Abdominal   Peds  Hematology   Anesthesia Other Findings   Reproductive/Obstetrics                           Anesthesia Physical Anesthesia Plan  ASA: II  Anesthesia Plan: MAC   Post-op Pain Management:    Induction: Intravenous  Airway Management Planned: Simple Face Mask  Additional Equipment:   Intra-op Plan:   Post-operative Plan:   Informed Consent: I have reviewed the patients History and Physical, chart, labs and discussed the procedure including the risks, benefits and alternatives for the proposed anesthesia with the patient or authorized representative who has indicated his/her understanding and acceptance.   Dental advisory given  Plan Discussed with:   Anesthesia Plan Comments:         Anesthesia Quick Evaluation

## 2014-02-09 NOTE — Op Note (Signed)
[facility] [address] [place], [address]   OPERATIVE PROCEDURE REPORT  PATIENT: Cassandra Ibarra, Cassandra Ibarra  MR#: [id number] BIRTHDATE: 1991/07/04  GENDER: Female ENDOSCOPIST: Jeani Hawking, MD ASSISTANT:   Priscella Mann, technician and Windell Hummingbird, technician PROCEDURE DATE: 02/09/2014 PROCEDURE:   EGD, diagnostic ASA CLASS:   Class III INDICATIONS:ABM pain and history of a duodenal ulcer. MEDICATIONS: MAC sedation, administered by CRNA TOPICAL ANESTHETIC:   Cetacaine Spray  DESCRIPTION OF PROCEDURE:   After the risks benefits and alternatives of the procedure were thoroughly explained, informed consent was obtained.  The Pentax Gastroscope [id number]  endoscope was introduced through the mouth  and advanced to the second portion of the duodenum Without limitations.      The instrument was slowly withdrawn as the mucosa was fully examined.      FINDINGS: The esophagus was normal.  The gastric lumen was also normal, but in the duodenal bulb an 8-10 mm clean-based duodenal uler was identified.  No evidence of any surrounding inflammation. This appears to be an acute recurrent ulcer.   Retroflexed views revealed no abnormalities.     The scope was then withdrawn from the patient and the procedure terminated.  COMPLICATIONS: There were no complications.  IMPRESSION: 1) Duodenal ulcer - clean-based and nonbleeding.  RECOMMENDATIONS: 1) Continue with PPI. 2) Add on sucralfate 1 gram QID. 3) Follow up in one month.  _______________________________ eSigned:  Jeani Hawking, MD 02/09/2014 2:56 PM

## 2014-02-09 NOTE — Anesthesia Postprocedure Evaluation (Signed)
  Anesthesia Post-op Note  Patient: Cassandra Ibarra  Procedure(s) Performed: Procedure(s): ESOPHAGOGASTRODUODENOSCOPY (EGD) WITH PROPOFOL (N/A)  Patient Location: PACU and Endoscopy Unit  Anesthesia Type:MAC  Level of Consciousness: awake  Airway and Oxygen Therapy: Patient Spontanous Breathing  Post-op Pain: mild  Post-op Assessment: Post-op Vital signs reviewed  Post-op Vital Signs: Reviewed  Last Vitals:  Filed Vitals:   02/09/14 1520  BP: 138/79  Pulse: 90  Temp:   Resp: 22    Complications: No apparent anesthesia complications

## 2014-02-09 NOTE — Anesthesia Postprocedure Evaluation (Signed)
  Anesthesia Post-op Note  Patient: Cassandra Ibarra  Procedure(s) Performed: Procedure(s): ESOPHAGOGASTRODUODENOSCOPY (EGD) WITH PROPOFOL (N/A)  Patient Location: Endoscopy Unit  Anesthesia Type:MAC  Level of Consciousness: awake, alert , oriented and patient cooperative  Airway and Oxygen Therapy: Patient Spontanous Breathing  Post-op Pain: none  Post-op Assessment: Post-op Vital signs reviewed, Patient's Cardiovascular Status Stable, Respiratory Function Stable, Patent Airway and No signs of Nausea or vomiting  Post-op Vital Signs: Reviewed and stable  Last Vitals:  Filed Vitals:   02/09/14 1453  BP:   Pulse: 88  Temp:   Resp: 22    Complications: No apparent anesthesia complications

## 2014-02-09 NOTE — Discharge Instructions (Signed)
Gastrointestinal Endoscopy °Care After °Refer to this sheet in the next few weeks. These instructions provide you with information on caring for yourself after your procedure. Your caregiver may also give you more specific instructions. Your treatment has been planned according to current medical practices, but problems sometimes occur. Call your caregiver if you have any problems or questions after your procedure. °HOME CARE INSTRUCTIONS °· If you were given medicine to help you relax (sedative), do not drive, operate machinery, or sign important documents for 24 hours. °· Avoid alcohol and hot or warm beverages for the first 24 hours after the procedure. °· Only take over-the-counter or prescription medicines for pain, discomfort, or fever as directed by your caregiver. You may resume taking your normal medicines unless your caregiver tells you otherwise. Ask your caregiver when you may resume taking medicines that may cause bleeding, such as aspirin, clopidogrel, or warfarin. °· You may return to your normal diet and activities on the day after your procedure, or as directed by your caregiver. Walking may help to reduce any bloated feeling in your abdomen. °· Drink enough fluids to keep your urine clear or pale yellow. °· You may gargle with salt water if you have a sore throat. °SEEK IMMEDIATE MEDICAL CARE IF: °· You have severe nausea or vomiting. °· You have severe abdominal pain, abdominal cramps that last longer than 6 hours, or abdominal swelling (distention). °· You have severe shoulder or back pain. °· You have trouble swallowing. °· You have shortness of breath, your breathing is shallow, or you are breathing faster than normal. °· You have a fever or a rapid heartbeat. °· You vomit blood or material that looks like coffee grounds. °· You have bloody, black, or tarry stools. °MAKE SURE YOU: °· Understand these instructions. °· Will watch your condition. °· Will get help right away if you are not doing  well or get worse. °Document Released: 04/01/2004 Document Revised: 02/17/2012 Document Reviewed: 11/18/2011 °ExitCare® Patient Information ©2014 [employer]. ° °

## 2014-02-09 NOTE — H&P (Signed)
   Raynald Kemp HPI: This is a 23 year old female with a PMH of a duodenal ulcer who presents with abdominal pain.  Her abdominal pain started several days ago and it is exactly the same type of duodenal ulcer pain.  Her last EGD was in 2013.  Past Medical History  Diagnosis Date  . PCOS (polycystic ovarian syndrome)   . Depression   . PONV (postoperative nausea and vomiting)   . 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 Wisdom tooth extraction Left 09/2013    History reviewed. No pertinent family history.  Social History:  reports that she has never smoked. She does not have any smokeless tobacco history on file. She reports that she drinks alcohol. She reports that she does not use illicit drugs.  Allergies:  Allergies  Allergen Reactions  . Ocella [Drospirenone-Ethinyl Estradiol] Shortness Of Breath  . Adhesive [Tape] Itching  . Lorazepam Other (See Comments)    "makes me angry"    Medications:  Scheduled:  Continuous: . sodium chloride 20 mL/hr at 02/09/14 1314    No results found for this or any previous visit (from the past 24 hour(s)).   No results found.  ROS:  As stated above in the HPI otherwise negative.  Blood pressure 191/104, pulse 100, temperature 97.8 F (36.6 C), temperature source Oral, resp. rate 22, height 5\' 7"  (1.702 m), weight 337 lb (152.862 kg), last menstrual period 01/18/2014, SpO2 97.00%.    PE: Gen: NAD, Alert and Oriented HEENT:  [place]/AT, EOMI Neck: Supple, no LAD Lungs: CTA Bilaterally CV: RRR without M/G/R ABM: Soft, obese, tender in the epigastric region, +BS Ext: No C/C/E  Assessment/Plan: 1) ABM pain. 2) History of a duodenal ulcer.  Plan: 1) EGD.  Mili Piltz D 02/09/2014, 2:24 PM

## 2014-02-10 ENCOUNTER — Encounter (HOSPITAL_COMMUNITY): Payer: Self-pay | Admitting: Gastroenterology

## 2014-03-10 ENCOUNTER — Encounter: Payer: Self-pay | Admitting: *Deleted

## 2014-03-13 ENCOUNTER — Ambulatory Visit: Payer: BC Managed Care – PPO | Attending: Family Medicine | Admitting: Physical Therapy

## 2014-03-13 DIAGNOSIS — M545 Low back pain, unspecified: Secondary | ICD-10-CM | POA: Insufficient documentation

## 2014-03-13 DIAGNOSIS — M546 Pain in thoracic spine: Secondary | ICD-10-CM | POA: Insufficient documentation

## 2014-03-17 ENCOUNTER — Other Ambulatory Visit: Payer: Self-pay | Admitting: Family Medicine

## 2014-03-23 ENCOUNTER — Ambulatory Visit: Payer: BC Managed Care – PPO | Admitting: Physical Therapy

## 2014-03-23 DIAGNOSIS — M545 Low back pain, unspecified: Secondary | ICD-10-CM | POA: Diagnosis not present

## 2014-03-30 ENCOUNTER — Ambulatory Visit: Payer: BC Managed Care – PPO | Admitting: Physical Therapy

## 2014-04-03 ENCOUNTER — Ambulatory Visit: Payer: BC Managed Care – PPO | Attending: Family Medicine | Admitting: Physical Therapy

## 2014-04-03 DIAGNOSIS — M545 Low back pain, unspecified: Secondary | ICD-10-CM | POA: Diagnosis present

## 2014-04-03 DIAGNOSIS — M546 Pain in thoracic spine: Secondary | ICD-10-CM | POA: Insufficient documentation

## 2014-04-12 ENCOUNTER — Ambulatory Visit: Payer: BC Managed Care – PPO | Admitting: Physical Therapy

## 2014-06-10 ENCOUNTER — Emergency Department (HOSPITAL_COMMUNITY): Payer: BC Managed Care – PPO

## 2014-06-10 ENCOUNTER — Encounter (HOSPITAL_COMMUNITY): Payer: Self-pay | Admitting: Emergency Medicine

## 2014-06-10 ENCOUNTER — Emergency Department (HOSPITAL_COMMUNITY)
Admission: EM | Admit: 2014-06-10 | Discharge: 2014-06-11 | Disposition: A | Discharge status: Home / Self Care | Payer: BC Managed Care – PPO | Attending: Emergency Medicine | Admitting: Emergency Medicine

## 2014-06-10 DIAGNOSIS — R05 Cough: Secondary | ICD-10-CM | POA: Insufficient documentation

## 2014-06-10 DIAGNOSIS — Z8659 Personal history of other mental and behavioral disorders: Secondary | ICD-10-CM | POA: Insufficient documentation

## 2014-06-10 DIAGNOSIS — Z8673 Personal history of transient ischemic attack (TIA), and cerebral infarction without residual deficits: Secondary | ICD-10-CM | POA: Diagnosis not present

## 2014-06-10 DIAGNOSIS — R079 Chest pain, unspecified: Secondary | ICD-10-CM | POA: Diagnosis not present

## 2014-06-10 DIAGNOSIS — Z7982 Long term (current) use of aspirin: Secondary | ICD-10-CM | POA: Insufficient documentation

## 2014-06-10 DIAGNOSIS — R062 Wheezing: Secondary | ICD-10-CM | POA: Insufficient documentation

## 2014-06-10 DIAGNOSIS — Z79899 Other long term (current) drug therapy: Secondary | ICD-10-CM | POA: Diagnosis not present

## 2014-06-10 DIAGNOSIS — J40 Bronchitis, not specified as acute or chronic: Secondary | ICD-10-CM

## 2014-06-10 DIAGNOSIS — R0602 Shortness of breath: Secondary | ICD-10-CM | POA: Diagnosis present

## 2014-06-10 DIAGNOSIS — Z7951 Long term (current) use of inhaled steroids: Secondary | ICD-10-CM | POA: Insufficient documentation

## 2014-06-10 DIAGNOSIS — Z8679 Personal history of other diseases of the circulatory system: Secondary | ICD-10-CM | POA: Diagnosis not present

## 2014-06-10 DIAGNOSIS — J209 Acute bronchitis, unspecified: Secondary | ICD-10-CM | POA: Insufficient documentation

## 2014-06-10 LAB — CBC WITH DIFFERENTIAL/PLATELET
Basophils Absolute: 0 10*3/uL (ref 0.0–0.1)
Basophils Relative: 0 % (ref 0–1)
Eosinophils Absolute: 0.3 10*3/uL (ref 0.0–0.7)
Eosinophils Relative: 3 % (ref 0–5)
HCT: 37.3 % (ref 36.0–46.0)
Hemoglobin: 12.7 g/dL (ref 12.0–15.0)
Lymphocytes Relative: 47 % — ABNORMAL HIGH (ref 12–46)
Lymphs Abs: 5.1 10*3/uL — ABNORMAL HIGH (ref 0.7–4.0)
MCH: 27.8 pg (ref 26.0–34.0)
MCHC: 34 g/dL (ref 30.0–36.0)
MCV: 81.6 fL (ref 78.0–100.0)
Monocytes Absolute: 0.6 10*3/uL (ref 0.1–1.0)
Monocytes Relative: 5 % (ref 3–12)
Neutro Abs: 4.9 10*3/uL (ref 1.7–7.7)
Neutrophils Relative %: 45 % (ref 43–77)
Platelets: 331 10*3/uL (ref 150–400)
RBC: 4.57 MIL/uL (ref 3.87–5.11)
RDW: 12.7 % (ref 11.5–15.5)
WBC: 10.9 10*3/uL — ABNORMAL HIGH (ref 4.0–10.5)

## 2014-06-10 LAB — BASIC METABOLIC PANEL
Anion gap: 14 (ref 5–15)
BUN: 12 mg/dL (ref 6–23)
CO2: 22 mEq/L (ref 19–32)
Calcium: 8.8 mg/dL (ref 8.4–10.5)
Chloride: 101 mEq/L (ref 96–112)
Creatinine, Ser: 0.67 mg/dL (ref 0.50–1.10)
GFR calc Af Amer: >90 mL/min (ref >90)
GFR calc non Af Amer: >90 mL/min (ref >90)
Glucose, Bld: 108 mg/dL — ABNORMAL HIGH (ref 70–99)
Potassium: 4.2 mEq/L (ref 3.7–5.3)
Sodium: 137 mEq/L (ref 137–147)

## 2014-06-10 MED ORDER — ALBUTEROL SULFATE (2.5 MG/3ML) 0.083% IN NEBU
5.0000 mg | INHALATION_SOLUTION | Freq: Once | RESPIRATORY_TRACT | Status: AC
  Administered 2014-06-10: 5 mg via RESPIRATORY_TRACT
  Filled 2014-06-10: qty 6

## 2014-06-10 MED ORDER — HYDROCOD POLST-CHLORPHEN POLST 10-8 MG/5ML PO LQCR
5.0000 mL | Freq: Once | ORAL | Status: AC
Start: 2014-06-10 — End: 2014-06-10
  Administered 2014-06-10: 5 mL via ORAL
  Filled 2014-06-10: qty 5

## 2014-06-10 MED ORDER — DEXAMETHASONE SODIUM PHOSPHATE 10 MG/ML IJ SOLN
10.0000 mg | Freq: Once | INTRAMUSCULAR | Status: AC
  Administered 2014-06-11: 10 mg via INTRAVENOUS
  Filled 2014-06-10: qty 1

## 2014-06-10 MED ORDER — IPRATROPIUM BROMIDE 0.02 % IN SOLN
0.5000 mg | Freq: Once | RESPIRATORY_TRACT | Status: AC
  Administered 2014-06-10: 0.5 mg via RESPIRATORY_TRACT
  Filled 2014-06-10: qty 2.5

## 2014-06-10 MED ORDER — SODIUM CHLORIDE 0.9 % IV BOLUS (SEPSIS)
1000.0000 mL | Freq: Once | INTRAVENOUS | Status: AC
  Administered 2014-06-10: 1000 mL via INTRAVENOUS

## 2014-06-10 NOTE — ED Notes (Signed)
Pt arrived to the ED with a complaint of shortness of breath with associated chest pain.  Pt states she has been fighting a URI and Bronchitis for several weeks.   A visit to her PCP yesterday didn't provide relief.  Pt states she has a cough which causes shortness of breath, then she has chest and back pain.  Pt has been on antibiotics and inhalers with no relief.

## 2014-06-10 NOTE — ED Notes (Signed)
Call to Respiratory for treatment

## 2014-06-11 LAB — D-DIMER, QUANTITATIVE: D-Dimer, Quant: 0.35 ug/mL-FEU (ref 0.00–0.48)

## 2014-06-11 MED ORDER — MORPHINE SULFATE 4 MG/ML IJ SOLN
4.0000 mg | Freq: Once | INTRAMUSCULAR | Status: AC
  Administered 2014-06-11: 4 mg via INTRAVENOUS
  Filled 2014-06-11: qty 1

## 2014-06-11 MED ORDER — DOXYCYCLINE HYCLATE 100 MG PO CAPS: 100.0000 mg | ORAL_CAPSULE | Freq: Two times a day (BID) | ORAL | Status: DC

## 2014-06-11 MED ORDER — ACETAMINOPHEN-CODEINE 120-12 MG/5ML PO SOLN: 10.0000 mL | ORAL | Status: DC | PRN

## 2014-06-11 MED ORDER — GUAIFENESIN ER 1200 MG PO TB12: 1.0000 | ORAL_TABLET | Freq: Two times a day (BID) | ORAL | Status: DC

## 2014-06-11 MED ORDER — IBUPROFEN 800 MG PO TABS: 800.0000 mg | ORAL_TABLET | Freq: Three times a day (TID) | ORAL | Status: DC | PRN

## 2014-06-11 NOTE — Discharge Instructions (Signed)
Return here as needed.  Followup with your primary care Dr. increase her fluid intake, and rest as much as possible

## 2014-06-11 NOTE — ED Provider Notes (Signed)
Complains of cough and shortness of breath and tightness in her chest, diffusely for approximately the past 3 weeks symptoms initially covered by chills. She was treated with a Z-Pak and with steroids, without relief. Cough initially produced yellow and green sputum. Cough is presently dry. She is coughing less since onset of illness. She presently complains of chest tightness ,, pleuritic in quality and shortness of breath. On exam alert appears mildly uncomfortable Glasgow Coma Score 15 lungs clear auscultation, occasional cough. Heart tachycardic regular rhythm  Doug SouSam Nikcole Eischeid, MD 06/11/14 [phone]

## 2014-06-11 NOTE — ED Notes (Signed)
Patient is alert and oriented x3.  She was given DC instructions and follow up visit instructions.  Patient gave verbal understanding. She was DC ambulatory under her own power to home.  V/S stable.  He was not showing any signs of distress on DC 

## 2014-06-15 ENCOUNTER — Institutional Professional Consult (permissible substitution): Payer: BC Managed Care – PPO | Admitting: Internal Medicine

## 2014-06-17 NOTE — ED Provider Notes (Signed)
CSN: [id number]     Arrival date & time 06/10/14  2119 History   First MD Initiated Contact with Patient 06/10/14 2131     Chief Complaint  Patient presents with  . Shortness of Breath  . Chest Pain     (Consider location/radiation/quality/duration/timing/severity/associated sxs/prior Treatment) HPI The patient presents to the emergency department with chest pain, and shortness of breath, with cough and wheezing over the last 2 weeks.  Patient was seen by her primary care doctor and given an inhaler and antibiotics, but still feels, no relief of her symptoms.  Patient, states, that nothing seems to help with her cough.  Patient, states, that she's having upper back pain, as well.  Patient, states, that she's not had any nausea, vomiting, diarrhea, headache, blurred vision, or weakness, rash, abdominal pain, or syncope.  The patient, states, that the medications given by her doctor did not seem to relieve her symptoms Past Medical History  Diagnosis Date  . PCOS (polycystic ovarian syndrome)   . Depression   . PONV (postoperative nausea and vomiting)   . 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 Wisdom tooth extraction Left 09/2013  . Esophagogastroduodenoscopy (egd) with propofol N/A 02/09/2014    Procedure: ESOPHAGOGASTRODUODENOSCOPY (EGD) WITH PROPOFOL;  Surgeon: Theda BelfastPatrick D Hung, MD;  [facility] END[facility]COPY;  Service: Endoscopy;  Laterality: N/A;   Family History  Problem Relation Age of Onset  . Crohn's disease Neg Hx    History  Substance Use Topics  . Smoking status: Never Smoker   . Smokeless tobacco: Not on file  . Alcohol Use: Yes     Comment: 3 beers a week    OB History   Grav Para Term Preterm Abortions TAB SAB Ect Mult Living                 Review of Systems   All other systems negative except as documented in the HPI. All pertinent positives and negatives as reviewed in the HPI.  Allergies  Ocella;  Adhesive; and Lorazepam  Home Medications   Prior to Admission medications   Medication Sig Start Date End Date Taking? Authorizing Provider  albuterol (PROVENTIL HFA;VENTOLIN HFA) 108 (90 BASE) MCG/ACT inhaler Inhale 1 puff into the lungs every 6 (six) hours as needed for wheezing or shortness of breath.   Yes Historical Provider, MD  beclomethasone (QVAR) 80 MCG/ACT inhaler Inhale into the lungs 2 (two) times daily.   Yes Historical Provider, MD  esomeprazole (NEXIUM) 40 MG capsule Take 40 mg by mouth daily.    Yes Historical Provider, MD  promethazine-codeine (PHENERGAN WITH CODEINE) 6.25-10 MG/5ML syrup Take by mouth every 6 (six) hours as needed for cough.   Yes Historical Provider, MD  acetaminophen-codeine 120-12 MG/5ML solution Take 10 mLs by mouth every 4 (four) hours as needed for moderate pain (and cough). 06/11/14   Jamesetta Orleanshristopher W Nael Petrosyan, PA-C  aspirin-acetaminophen-caffeine (EXCEDRIN MIGRAINE) [phone] MG per tablet Take 1 tablet by mouth every 6 (six) hours as needed. For migraines    Historical Provider, MD  doxycycline (VIBRAMYCIN) 100 MG capsule Take 1 capsule (100 mg total) by mouth 2 (two) times daily. 06/11/14   Jamesetta Orleanshristopher W Jaymien Landin, PA-C  Guaifenesin 1200 MG TB12 Take 1 tablet (1,200 mg total) by mouth 2 (two) times daily. 06/11/14   Jamesetta Orleanshristopher W Joandy Burget, PA-C  ibuprofen (ADVIL,MOTRIN) 800 MG tablet Take 1 tablet (800 mg total) by mouth every 8 (eight) hours  as needed. 06/11/14   Jamesetta Orleanshristopher W Danisa Kopec, PA-C  promethazine (PHENERGAN) 25 MG tablet Take 1 tablet (25 mg total) by mouth every 6 (six) hours as needed for nausea or vomiting. 12/21/13   Rodolph BongEvan S Corey, MD   BP 133/84  Pulse 92  Temp(Src) 98.1 F (36.7 C) (Oral)  Resp 14  Ht 5\' 7"  (1.702 m)  Wt 376 lb (170.552 kg)  BMI 58.88 kg/m2  SpO2 98% Physical Exam  Nursing note and vitals reviewed. Constitutional: She is oriented to person, place, and time. She appears well-developed and well-nourished.  HENT:  Head:  Normocephalic and atraumatic.  Mouth/Throat: Oropharynx is clear and moist.  Eyes: Pupils are equal, round, and reactive to light.  Neck: Normal range of motion. Neck supple.  Cardiovascular: Normal rate, regular rhythm and normal heart sounds.  Exam reveals no gallop and no friction rub.   No murmur heard. Pulmonary/Chest: Effort normal. No respiratory distress. She has wheezes. She has no rales.  Neurological: She is alert and oriented to person, place, and time. She exhibits normal muscle tone. Coordination normal.  Skin: Skin is warm and dry.    ED Course  Procedures (including critical care time) Labs Review Labs Reviewed  CBC WITH DIFFERENTIAL - Abnormal; Notable for the following:    WBC 10.9 (*)    Lymphocytes Relative 47 (*)    Lymphs Abs 5.1 (*)    All other components within normal limits  BASIC METABOLIC PANEL - Abnormal; Notable for the following:    Glucose, Bld 108 (*)    All other components within normal limits  D-DIMER, QUANTITATIVE    Imaging Review No results found.   EKG Interpretation   Date/Time:  Saturday June 10 2014 21:52:24 EDT Ventricular Rate:  112 PR Interval:  147 QRS Duration: 76 QT Interval:  317 QTC Calculation: 433 R Axis:   28 Text Interpretation:  Sinus tachycardia Low voltage, precordial leads ED  PHYSICIAN INTERPRETATION AVAILABLE IN [facility] Confirmed by TEST,  Record ([id number]) on 06/12/2014 7:04:14 AM     Patient will be treated with further symptom control, and told to return here as needed.  Patient is advised followup with her primary care Dr. and told to increase her fluid intake, and rest as much as possible.    Carlyle Dollyhristopher W Abdulla Pooley, PA-C 06/17/14 [phone]

## 2014-06-19 NOTE — ED Provider Notes (Signed)
Medical screening examination/treatment/procedure(s) were conducted as a shared visit with non-physician practitioner(s) and myself.  I personally evaluated the patient during the encounter.   EKG Interpretation   Date/Time:  Saturday June 10 2014 21:52:24 EDT Ventricular Rate:  112 PR Interval:  147 QRS Duration: 76 QT Interval:  317 QTC Calculation: 433 R Axis:   28 Text Interpretation:  Sinus tachycardia Low voltage, precordial leads ED  PHYSICIAN INTERPRETATION AVAILABLE IN [facility] Confirmed by TEST,  Record ([id number]) on 06/12/2014 7:04:14 AM       Doug SouSam Lien Lyman, MD 06/19/14 1449

## 2014-09-14 ENCOUNTER — Encounter (HOSPITAL_COMMUNITY): Payer: Self-pay | Admitting: Gastroenterology

## 2015-04-28 ENCOUNTER — Ambulatory Visit (INDEPENDENT_AMBULATORY_CARE_PROVIDER_SITE_OTHER): Payer: BLUE CROSS/BLUE SHIELD

## 2015-04-28 ENCOUNTER — Ambulatory Visit (INDEPENDENT_AMBULATORY_CARE_PROVIDER_SITE_OTHER): Payer: BLUE CROSS/BLUE SHIELD | Admitting: Physician Assistant

## 2015-04-28 VITALS — BP 128/80 | HR 127 | Temp 98.2°F | Resp 17 | Ht 66.5 in | Wt >= 6400 oz

## 2015-04-28 DIAGNOSIS — E282 Polycystic ovarian syndrome: Secondary | ICD-10-CM

## 2015-04-28 DIAGNOSIS — M62830 Muscle spasm of back: Secondary | ICD-10-CM

## 2015-04-28 DIAGNOSIS — M549 Dorsalgia, unspecified: Secondary | ICD-10-CM

## 2015-04-28 LAB — POCT URINE PREGNANCY: Preg Test, Ur: NEGATIVE

## 2015-04-28 MED ORDER — MELOXICAM 7.5 MG PO TABS: 7.5000 mg | ORAL_TABLET | Freq: Every day | ORAL | Status: DC

## 2015-04-28 MED ORDER — METAXALONE 800 MG PO TABS: 800.0000 mg | ORAL_TABLET | Freq: Every day | ORAL | Status: DC

## 2015-04-28 MED ORDER — METHOCARBAMOL 500 MG PO TABS: 500.0000 mg | ORAL_TABLET | Freq: Four times a day (QID) | ORAL | Status: DC | PRN

## 2015-04-28 MED ORDER — CYCLOBENZAPRINE HCL 10 MG PO TABS: 10.0000 mg | ORAL_TABLET | Freq: Every day | ORAL | Status: DC

## 2015-04-28 NOTE — Progress Notes (Addendum)
[facility] [address] [place] [address] [phone]  Date:  04/28/2015   Name:  Cassandra Ibarra   DOB:  [birth year]   MRN:  [id number]  PCP:  Pcp Not In System    History of Present Illness:  Cassandra Ibarra is a 24 y.o. female patient who presents to [facility] for back pain.  Patient has back pain for 1.5 years that had progressively worsened over the last month but has been a constant pain for the last 2 weeks. Patient was initially seen more than a year ago with a diagnosis of possible muscle tear and injury to back. Pain located at her lower back at the right side, but now she can have a pain in the center of her back that shoots up and down.  She has no numbness or tingling, weakness, or incontinence.  It is aggravated by walking, placing overhead, and reaching downward.  Rates 10/10.  Trouble sleeping due to the pain.  Relieved with flexeril.  She does not take inflammatory due to it aggravating stomach lining.  She will be seen by endocrinology 05/18/2015 to aid in her weight and PCOS.  Will be placed on metformin.  She is currently not exercising.  Not many diet restrictions.     There are no active problems to display for this patient.   Past Medical History  Diagnosis Date  . PCOS (polycystic ovarian syndrome)   . Depression   . PONV (postoperative nausea and vomiting)   . 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 Wisdom tooth extraction Left 09/2013  . Esophagogastroduodenoscopy (egd) with propofol N/A 02/09/2014    Procedure: ESOPHAGOGASTRODUODENOSCOPY (EGD) WITH PROPOFOL;  Surgeon: Theda Belfast, MD;  Location: [facility] ENDOSCOPY;  Service: Endoscopy;  Laterality: N/A;    Social History  Substance Use Topics  . Smoking status: Never Smoker   . Smokeless tobacco: None  . Alcohol Use: Yes     Comment: 3 beers a week     Family History  Problem Relation Age of Onset  . Crohn's disease Neg  Hx     Allergies  Allergen Reactions  . Ocella [Drospirenone-Ethinyl Estradiol] Shortness Of Breath  . Adhesive [Tape] Itching  . Lorazepam Other (See Comments)    "makes me angry"    Medication list has been reviewed and updated.  Current Outpatient Prescriptions on File Prior to Visit  Medication Sig Dispense Refill  . albuterol (PROVENTIL HFA;VENTOLIN HFA) 108 (90 BASE) MCG/ACT inhaler Inhale 1 puff into the lungs every 6 (six) hours as needed for wheezing or shortness of breath.    Marland Kitchen aspirin-acetaminophen-caffeine (EXCEDRIN MIGRAINE) 250-250-65 MG per tablet Take 1 tablet by mouth every 6 (six) hours as needed. For migraines    . beclomethasone (QVAR) 80 MCG/ACT inhaler Inhale into the lungs 2 (two) times daily.    Marland Kitchen esomeprazole (NEXIUM) 40 MG capsule Take 40 mg by mouth daily.     Marland Kitchen ibuprofen (ADVIL,MOTRIN) 800 MG tablet Take 1 tablet (800 mg total) by mouth every 8 (eight) hours as needed. 30 tablet 0  . promethazine (PHENERGAN) 25 MG tablet Take 1 tablet (25 mg total) by mouth every 6 (six) hours as needed for nausea or vomiting. 30 tablet 0  . Guaifenesin 1200 MG TB12 Take 1 tablet (1,200 mg total) by mouth 2 (two) times daily. (Patient not taking: Reported on 04/28/2015) 20 each 0  .  promethazine-codeine (PHENERGAN WITH CODEINE) 6.25-10 MG/5ML syrup Take by mouth every 6 (six) hours as needed for cough.     No current facility-administered medications on file prior to visit.    ROS ROS otherwise unremarkable unless listed above.  Physical Examination: BP 128/80 mmHg  Pulse 127  Temp(Src) 98.2 F (36.8 C) (Oral)  Resp 17  Ht 5' 6.5" (1.689 m)  Wt 402 lb (182.346 kg)  BMI 63.92 kg/m2  SpO2 98%  LMP 01/22/2015 Ideal Body Weight: Weight in (lb) to have BMI = 25: 156.9  Physical Exam  Constitutional: She is oriented to person, place, and time. She appears well-developed and well-nourished. No distress.  HENT:  Head: Normocephalic and atraumatic.  Eyes: Pupils are  equal, round, and reactive to light.  Cardiovascular: Normal rate, regular rhythm and normal heart sounds.  Exam reveals no friction rub.   No murmur heard. Pulmonary/Chest: Effort normal and breath sounds normal. No respiratory distress. She has no wheezes.  Abdominal: Soft. Bowel sounds are normal. She exhibits no mass.  Musculoskeletal:  Thoracic spinous tenderness.  Right sided lumbar musculature with muscle spasms--tender to palpation.  No bruising or ecchymosis.  45 degrees with forward flexion.  Normal lower extremity strength.  Raising thighs can incite right lower lumbar musculature pain.   Negative straight leg raise test.       Neurological: She is alert and oriented to person, place, and time. No cranial nerve deficit. Coordination normal.  Skin: Skin is warm and dry. She is not diaphoretic.  Psychiatric: She has a normal mood and affect. Her behavior is normal.    [facility] reading (PRIMARY) by  Dr. Milus Glazier: Negative  Assessment and Plan: 24 year old female is here today for chief complaint of back pain.  Consistent with muscle spasms.  I have advised that she use skelaxin during the day for back pain, followed by flexeril at nighttime only.  Stretches given both verbally and in handout, ice, heat prior to stretches, followed by ice.  Discussion of exercise, and the obesity which is likely effecting this back pain.  Referral to nutritionist with consult appreciated.  She has a very high risk of cardiovascular dz.  Endocrinology is very appropriate at this time.   Will refer to ortho if sxs do not improve in 10 days.  1. Back pain, unspecified location - DG Thoracic Spine 2 View - DG Lumbar Spine Complete - POCT urine pregnancy - Ambulatory referral to Nutrition and Diabetic Education - meloxicam (MOBIC) 7.5 MG tablet; Take 1 tablet (7.5 mg total) by mouth daily.  Dispense: 30 tablet; Refill: 0 - cyclobenzaprine (FLEXERIL) 10 MG tablet; Take 1 tablet (10 mg total) by mouth at  bedtime.  Dispense: 30 tablet; Refill: 1 - metaxalone (SKELAXIN) 800 MG tablet; Take 1 tablet (800 mg total) by mouth daily.  Dispense: 30 tablet; Refill: 0  2. Obesity, morbid, BMI 50 or higher  3. PCOS (polycystic ovarian syndrome) - Ambulatory referral to Nutrition and Diabetic Education  4. Back spasm - cyclobenzaprine (FLEXERIL) 10 MG tablet; Take 1 tablet (10 mg total) by mouth at bedtime.  Dispense: 30 tablet; Refill: 1   Trena Platt, PA-C [facility] 04/28/2015 4:36 PM    s

## 2015-04-28 NOTE — Patient Instructions (Addendum)
Make sure you are hydrating well.  Please take the meloxicam once per day.  This is the anti-inflammatory. Please take the skelaxin once per day, and the flexeril at night.   Back Exercises These exercises may help you when beginning to rehabilitate your injury. Your symptoms may resolve with or without further involvement from your physician, physical therapist or athletic trainer. While completing these exercises, remember:   Restoring tissue flexibility helps normal motion to return to the joints. This allows healthier, less painful movement and activity.  An effective stretch should be held for at least 30 seconds.  A stretch should never be painful. You should only feel a gentle lengthening or release in the stretched tissue. STRETCH - Extension, Prone on Elbows   Lie on your stomach on the floor, a bed will be too soft. Place your palms about shoulder width apart and at the height of your head.  Place your elbows under your shoulders. If this is too painful, stack pillows under your chest.  Allow your body to relax so that your hips drop lower and make contact more completely with the floor.  Hold this position for __________ seconds.  Slowly return to lying flat on the floor. Repeat __________ times. Complete this exercise __________ times per day.  RANGE OF MOTION - Extension, Prone Press Ups   Lie on your stomach on the floor, a bed will be too soft. Place your palms about shoulder width apart and at the height of your head.  Keeping your back as relaxed as possible, slowly straighten your elbows while keeping your hips on the floor. You may adjust the placement of your hands to maximize your comfort. As you gain motion, your hands will come more underneath your shoulders.  Hold this position __________ seconds.  Slowly return to lying flat on the floor. Repeat __________ times. Complete this exercise __________ times per day.  RANGE OF MOTION- Quadruped, Neutral Spine    Assume a hands and knees position on a firm surface. Keep your hands under your shoulders and your knees under your hips. You may place padding under your knees for comfort.  Drop your head and point your tail bone toward the ground below you. This will round out your low back like an angry cat. Hold this position for __________ seconds.  Slowly lift your head and release your tail bone so that your back sags into a large arch, like an old horse.  Hold this position for __________ seconds.  Repeat this until you feel limber in your low back.  Now, find your "sweet spot." This will be the most comfortable position somewhere between the two previous positions. This is your neutral spine. Once you have found this position, tense your stomach muscles to support your low back.  Hold this position for __________ seconds. Repeat __________ times. Complete this exercise __________ times per day.  STRETCH - Flexion, Single Knee to Chest   Lie on a firm bed or floor with both legs extended in front of you.  Keeping one leg in contact with the floor, bring your opposite knee to your chest. Hold your leg in place by either grabbing behind your thigh or at your knee.  Pull until you feel a gentle stretch in your low back. Hold __________ seconds.  Slowly release your grasp and repeat the exercise with the opposite side. Repeat __________ times. Complete this exercise __________ times per day.  STRETCH - Hamstrings, Standing  Stand or sit and extend your  right / left leg, placing your foot on a chair or foot stool  Keeping a slight arch in your low back and your hips straight forward.  Lead with your chest and lean forward at the waist until you feel a gentle stretch in the back of your right / left knee or thigh. (When done correctly, this exercise requires leaning only a small distance.)  Hold this position for __________ seconds. Repeat __________ times. Complete this stretch __________  times per day. STRENGTHENING - Deep Abdominals, Pelvic Tilt   Lie on a firm bed or floor. Keeping your legs in front of you, bend your knees so they are both pointed toward the ceiling and your feet are flat on the floor.  Tense your lower abdominal muscles to press your low back into the floor. This motion will rotate your pelvis so that your tail bone is scooping upwards rather than pointing at your feet or into the floor.  With a gentle tension and even breathing, hold this position for __________ seconds. Repeat __________ times. Complete this exercise __________ times per day.  STRENGTHENING - Abdominals, Crunches   Lie on a firm bed or floor. Keeping your legs in front of you, bend your knees so they are both pointed toward the ceiling and your feet are flat on the floor. Cross your arms over your chest.  Slightly tip your chin down without bending your neck.  Tense your abdominals and slowly lift your trunk high enough to just clear your shoulder blades. Lifting higher can put excessive stress on the low back and does not further strengthen your abdominal muscles.  Control your return to the starting position. Repeat __________ times. Complete this exercise __________ times per day.  STRENGTHENING - Quadruped, Opposite UE/LE Lift   Assume a hands and knees position on a firm surface. Keep your hands under your shoulders and your knees under your hips. You may place padding under your knees for comfort.  Find your neutral spine and gently tense your abdominal muscles so that you can maintain this position. Your shoulders and hips should form a rectangle that is parallel with the floor and is not twisted.  Keeping your trunk steady, lift your right hand no higher than your shoulder and then your left leg no higher than your hip. Make sure you are not holding your breath. Hold this position __________ seconds.  Continuing to keep your abdominal muscles tense and your back steady,  slowly return to your starting position. Repeat with the opposite arm and leg. Repeat __________ times. Complete this exercise __________ times per day. Document Released: 09/05/2005 Document Revised: 11/10/2011 Document Reviewed: 11/30/2008 [facility] Patient Information 2015 [place], [place]. This information is not intended to replace advice given to you by your health care provider. Make sure you discuss any questions you have with your health care provider.  Exercise to Lose Weight Exercise and a healthy diet may help you lose weight. Your doctor may suggest specific exercises. EXERCISE IDEAS AND TIPS  Choose low-cost things you enjoy doing, such as walking, bicycling, or exercising to workout videos.  Take stairs instead of the elevator.  Walk during your lunch break.  Park your car further away from work or school.  Go to a gym or an exercise class.  Start with 5 to 10 minutes of exercise each day. Build up to 30 minutes of exercise 4 to 6 days a week.  Wear shoes with good support and comfortable clothes.  Stretch before and after  working out.  Work out until you breathe harder and your heart beats faster.  Drink extra water when you exercise.  Do not do so much that you hurt yourself, feel dizzy, or get very short of breath. Exercises that burn about 150 calories:  Running 1  miles in 15 minutes.  Playing volleyball for 45 to 60 minutes.  Washing and waxing a car for 45 to 60 minutes.  Playing touch football for 45 minutes.  Walking 1  miles in 35 minutes.  Pushing a stroller 1  miles in 30 minutes.  Playing basketball for 30 minutes.  Raking leaves for 30 minutes.  Bicycling 5 miles in 30 minutes.  Walking 2 miles in 30 minutes.  Dancing for 30 minutes.  Shoveling snow for 15 minutes.  Swimming laps for 20 minutes.  Walking up stairs for 15 minutes.  Bicycling 4 miles in 15 minutes.  Gardening for 30 to 45 minutes.  Jumping rope for 15  minutes.  Washing windows or floors for 45 to 60 minutes. Document Released: 09/20/2010 Document Revised: 11/10/2011 Document Reviewed: 09/20/2010 [facility] Patient Information 2015 [place], [place]. This information is not intended to replace advice given to you by your health care provider. Make sure you discuss any questions you have with your health care provider.  Calorie Counting for Weight Loss Calories are energy you get from the things you eat and drink. Your body uses this energy to keep you going throughout the day. The number of calories you eat affects your weight. When you eat more calories than your body needs, your body stores the extra calories as fat. When you eat fewer calories than your body needs, your body burns fat to get the energy it needs. Calorie counting means keeping track of how many calories you eat and drink each day. If you make sure to eat fewer calories than your body needs, you should lose weight. In order for calorie counting to work, you will need to eat the number of calories that are right for you in a day to lose a healthy amount of weight per week. A healthy amount of weight to lose per week is usually 1-2 lb (0.5-0.9 kg). A dietitian can determine how many calories you need in a day and give you suggestions on how to reach your calorie goal.  WHAT IS MY MY PLAN? My goal is to have __________ calories per day.  If I have this many calories per day, I should lose around __________ pounds per week. WHAT DO I NEED TO KNOW ABOUT CALORIE COUNTING? In order to meet your daily calorie goal, you will need to:  Find out how many calories are in each food you would like to eat. Try to do this before you eat.  Decide how much of the food you can eat.  Write down what you ate and how many calories it had. Doing this is called keeping a food log. WHERE DO I FIND CALORIE INFORMATION? The number of calories in a food can be found on a Nutrition Facts label. Note that all  the information on a label is based on a specific serving of the food. If a food does not have a Nutrition Facts label, try to look up the calories online or ask your dietitian for help. HOW DO I DECIDE HOW MUCH TO EAT? To decide how much of the food you can eat, you will need to consider both the number of calories in one serving and the size  of one serving. This information can be found on the Nutrition Facts label. If a food does not have a Nutrition Facts label, look up the information online or ask your dietitian for help. Remember that calories are listed per serving. If you choose to have more than one serving of a food, you will have to multiply the calories per serving by the amount of servings you plan to eat. For example, the label on a package of bread might say that a serving size is 1 slice and that there are 90 calories in a serving. If you eat 1 slice, you will have eaten 90 calories. If you eat 2 slices, you will have eaten 180 calories. HOW DO I KEEP A FOOD LOG? After each meal, record the following information in your food log:  What you ate.  How much of it you ate.  How many calories it had.  Then, add up your calories. Keep your food log near you, such as in a small notebook in your pocket. Another option is to use a mobile app or website. Some programs will calculate calories for you and show you how many calories you have left each time you add an item to the log. WHAT ARE SOME CALORIE COUNTING TIPS?  Use your calories on foods and drinks that will fill you up and not leave you hungry. Some examples of this include foods like nuts and nut butters, vegetables, lean proteins, and high-fiber foods (more than 5 g fiber per serving).  Eat nutritious foods and avoid empty calories. Empty calories are calories you get from foods or beverages that do not have many nutrients, such as candy and soda. It is better to have a nutritious high-calorie food (such as an avocado) than a food  with few nutrients (such as a bag of chips).  Know how many calories are in the foods you eat most often. This way, you do not have to look up how many calories they have each time you eat them.  Look out for foods that may seem like low-calorie foods but are really high-calorie foods, such as baked goods, soda, and fat-free candy.  Pay attention to calories in drinks. Drinks such as sodas, specialty coffee drinks, alcohol, and juices have a lot of calories yet do not fill you up. Choose low-calorie drinks like water and diet drinks.  Focus your calorie counting efforts on higher calorie items. Logging the calories in a garden salad that contains only vegetables is less important than calculating the calories in a milk shake.  Find a way of tracking calories that works for you. Get creative. Most people who are successful find ways to keep track of how much they eat in a day, even if they do not count every calorie. WHAT ARE SOME PORTION CONTROL TIPS?  Know how many calories are in a serving. This will help you know how many servings of a certain food you can have.  Use a measuring cup to measure serving sizes. This is helpful when you start out. With time, you will be able to estimate serving sizes for some foods.  Take some time to put servings of different foods on your favorite plates, bowls, and cups so you know what a serving looks like.  Try not to eat straight from a bag or box. Doing this can lead to overeating. Put the amount you would like to eat in a cup or on a plate to make sure you are eating the  right portion.  Use smaller plates, glasses, and bowls to prevent overeating. This is a quick and easy way to practice portion control. If your plate is smaller, less food can fit on it.  Try not to multitask while eating, such as watching TV or using your computer. If it is time to eat, sit down at a table and enjoy your food. Doing this will help you to start recognizing when you are  full. It will also make you more aware of what and how much you are eating. HOW CAN I CALORIE COUNT WHEN EATING OUT?  Ask for smaller portion sizes or child-sized portions.  Consider sharing an entree and sides instead of getting your own entree.  If you get your own entree, eat only half. Ask for a box at the beginning of your meal and put the rest of your entree in it so you are not tempted to eat it.  Look for the calories on the menu. If calories are listed, choose the lower calorie options.  Choose dishes that include vegetables, fruits, whole grains, low-fat dairy products, and lean protein. Focusing on smart food choices from each of the 5 food groups can help you stay on track at restaurants.  Choose items that are boiled, broiled, grilled, or steamed.  Choose water, milk, unsweetened iced tea, or other drinks without added sugars. If you want an alcoholic beverage, choose a lower calorie option. For example, a regular margarita can have up to 700 calories and a glass of wine has around 150.  Stay away from items that are buttered, battered, fried, or served with cream sauce. Items labeled "crispy" are usually fried, unless stated otherwise.  Ask for dressings, sauces, and syrups on the side. These are usually very high in calories, so do not eat much of them.  Watch out for salads. Many people think salads are a healthy option, but this is often not the case. Many salads come with bacon, fried chicken, lots of cheese, fried chips, and dressing. All of these items have a lot of calories. If you want a salad, choose a garden salad and ask for grilled meats or steak. Ask for the dressing on the side, or ask for olive oil and vinegar or lemon to use as dressing.  Estimate how many servings of a food you are given. For example, a serving of cooked rice is  cup or about the size of half a tennis ball or one cupcake wrapper. Knowing serving sizes will help you be aware of how much food you  are eating at restaurants. The list below tells you how big or small some common portion sizes are based on everyday objects.  1 oz--4 stacked dice.  3 oz--1 deck of cards.  1 tsp--1 dice.  1 Tbsp-- a Ping-Pong ball.  2 Tbsp--1 Ping-Pong ball.   cup--1 tennis ball or 1 cupcake wrapper.  1 cup--1 baseball. Document Released: 08/18/2005 Document Revised: 01/02/2014 Document Reviewed: 06/23/2013 [facility] Patient Information 2015 [place], [place]. This information is not intended to replace advice given to you by your health care provider. Make sure you discuss any questions you have with your health care provider.

## 2015-05-18 ENCOUNTER — Other Ambulatory Visit: Payer: Self-pay | Admitting: *Deleted

## 2015-05-18 ENCOUNTER — Ambulatory Visit (INDEPENDENT_AMBULATORY_CARE_PROVIDER_SITE_OTHER): Payer: BLUE CROSS/BLUE SHIELD | Admitting: Internal Medicine

## 2015-05-18 ENCOUNTER — Encounter: Payer: Self-pay | Admitting: Internal Medicine

## 2015-05-18 VITALS — BP 112/68 | HR 115 | Temp 97.8°F | Resp 12 | Ht 67.5 in | Wt >= 6400 oz

## 2015-05-18 DIAGNOSIS — E282 Polycystic ovarian syndrome: Secondary | ICD-10-CM

## 2015-05-18 LAB — TSH: TSH: 2.04 u[IU]/mL (ref 0.35–4.50)

## 2015-05-18 LAB — BASIC METABOLIC PANEL
BUN: 13 mg/dL (ref 6–23)
CALCIUM: 9.2 mg/dL (ref 8.4–10.5)
CO2: 24 meq/L (ref 19–32)
Chloride: 104 mEq/L (ref 96–112)
Creatinine, Ser: 0.62 mg/dL (ref 0.40–1.20)
GFR: 127.12 mL/min (ref >60.00)
GLUCOSE: 89 mg/dL (ref 70–99)
POTASSIUM: 3.8 meq/L (ref 3.5–5.1)
SODIUM: 137 meq/L (ref 135–145)

## 2015-05-18 LAB — LIPID PANEL
CHOL/HDL RATIO: 4
Cholesterol: 158 mg/dL (ref 0–200)
HDL: 36 mg/dL — AB (ref >39.00)
LDL CALC: 96 mg/dL (ref 0–99)
NonHDL: 122.14
TRIGLYCERIDES: 133 mg/dL (ref 0.0–149.0)
VLDL: 26.6 mg/dL (ref 0.0–40.0)

## 2015-05-18 LAB — LUTEINIZING HORMONE: LH: 9.35 m[IU]/mL

## 2015-05-18 LAB — T4, FREE: FREE T4: 1.13 ng/dL (ref 0.60–1.60)

## 2015-05-18 LAB — COMPLETE METABOLIC PANEL WITH GFR
ALT: 28 U/L (ref 6–29)
AST: 20 U/L (ref 10–30)
Albumin: 3.9 g/dL (ref 3.6–5.1)
Alkaline Phosphatase: 64 U/L (ref 33–115)
BUN: 12 mg/dL (ref 7–25)
CHLORIDE: 106 mmol/L (ref 98–110)
CO2: 23 mmol/L (ref 20–31)
CREATININE: 0.62 mg/dL (ref 0.50–1.10)
Calcium: 8.9 mg/dL (ref 8.6–10.2)
GFR, Est Non African American: >89 mL/min (ref >=60)
Glucose, Bld: 90 mg/dL (ref 65–99)
Potassium: 4 mmol/L (ref 3.5–5.3)
Sodium: 139 mmol/L (ref 135–146)
Total Bilirubin: 0.4 mg/dL (ref 0.2–1.2)
Total Protein: 6.7 g/dL (ref 6.1–8.1)

## 2015-05-18 LAB — FOLLICLE STIMULATING HORMONE: FSH: 7.7 m[IU]/mL

## 2015-05-18 LAB — VITAMIN D 25 HYDROXY (VIT D DEFICIENCY, FRACTURES): VITD: 9.75 ng/mL — AB (ref 30.00–100.00)

## 2015-05-18 LAB — HEMOGLOBIN A1C: HEMOGLOBIN A1C: 5.6 % (ref 4.6–6.5)

## 2015-05-18 LAB — T3, FREE: T3 FREE: 3.7 pg/mL (ref 2.3–4.2)

## 2015-05-18 MED ORDER — METFORMIN HCL ER 500 MG PO TB24: 1000.0000 mg | ORAL_TABLET | Freq: Two times a day (BID) | ORAL | Status: DC

## 2015-05-18 NOTE — Progress Notes (Signed)
Patient ID: Cassandra Ibarra, female   DOB: Mar 18, 1991, 24 y.o.   MRN: [id number]  HPI: [name] is a 24 y.o. female, self referred, for management of PCOS.  Patient was diagnosed with PCOS in 2010 (24 y/o).  Weight gain: - Difficult to lose weight, gained 75 lbs in last 2 years, continues to increase - + h/o steroid use - 2015, for back pain - no weight loss meds - Meals: - Breakfast: cereal - Lunch: sandwich/chips - Dinner: meat + potatoes, mac and cheese or veggie; pizza, [place]; sandwich - Snacks: 1 Drinks: trying to quit sodas - Diets tried: calorie counting; cutting portions - Exercise: stays active, no formal exercise routine - Tried Metformin then  Fertility/Menstrual cycles: - Menarche at 24 years old, her menstrual cycles were regular until 2008 - irregular menses afterwards - no h/o ovarian cysts, but no h/o pelvic U/Ss.  - children: 0 - miscarriages: 1 at 63 y/o - contraception: not now >> stopped Ocella at 24 y/o, restarted at 23: moody, low libido, SOB at one time, arm numbness >> off OCPs since then. In her family, women on OCPs >> HTN. - LMP 12/2014, this year only had 2-3.   Acne: - better since starting decreasing sodas - mostly before menses  Hirsutism: - On face, chin, lower abdomen  Treatments tried: - She was on Metformin before >> she was unable to tolerate this unless taken in divided doses. - did not try Spironolactone - did not try [place] - Tried OCPs: Loestrin, Ocella  Other meds: - SSRIs: no  Reviewed pertinent labs: - 2010: 17 hydroxyprogesterone normal  (per records from Dr. Ubaldo Glassing) - 02/21/2009: Total testosterone 61.18 (15-40), free testosterone 14.4 (1-5), SHBG 20 (18-114)   - 02/21/2009: DHEAS 55 (35-430)    - Last thyroid tests: 02/21/2009: TSH 2.635 (0.35-4.5)   - Last set of lipids: 03/22/2009: 165/164/38/101   - Last HbA1c: 02/21/2009: HbA1c 5.5%; glucose 82, insulin 31 (3-28)  She has FH of PCOS in her sister.  Father has morbid obesity, history of heart disease and MI at age 77, status post CABG.  ROS: Constitutional: + weight gain, + fatigue, no subjective hyperthermia/hypothermia, + poor sleep Eyes: + blurry vision, no xerophthalmia ENT: no sore throat, no nodules palpated in throat, no dysphagia/odynophagia, no hoarseness Cardiovascular: no CP/SOB/palpitations/leg swelling Respiratory: no cough/SOB Gastrointestinal: + N/+ V/+ D/no C/+ acid reflux Musculoskeletal: no muscle/joint aches Skin: + acne, + hair on face, + itching Neurological: no tremors/numbness/tingling/dizziness, + HA Psychiatric: + depression/+ anxiety  Past Medical History  Diagnosis Date  . PCOS (polycystic ovarian syndrome)   . Depression   . PONV (postoperative nausea and vomiting)   . 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 Wisdom tooth extraction Left 09/2013  . Esophagogastroduodenoscopy (egd) with propofol N/A 02/09/2014    Procedure: ESOPHAGOGASTRODUODENOSCOPY (EGD) WITH PROPOFOL;  Surgeon: Beryle Beams, MD;  Location: [facility];  Service: Endoscopy;  Laterality: N/A;   Social History   Social History  . Marital Status: Married    Spouse Name: N/A  . Number of Children: 0   Occupational History  . 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 Smoking status: Never Smoker   . Smokeless tobacco: Not on file  . Alcohol Use: Yes     Comment: 2x a week - beer. Liquor, 2-3 drinks  . Drug Use: No  .  Sexual Activity: Yes   Current Outpatient Prescriptions on File Prior to Visit  Medication Sig Dispense Refill  . albuterol (PROVENTIL HFA;VENTOLIN HFA) 108 (90 BASE) MCG/ACT inhaler Inhale 1 puff into the lungs every 6 (six) hours as needed for wheezing or shortness of breath.    Marland Kitchen aspirin-acetaminophen-caffeine (EXCEDRIN MIGRAINE) 250-250-65 MG per tablet Take 1 tablet by mouth every 6 (six) hours as needed. For  migraines    . beclomethasone (QVAR) 80 MCG/ACT inhaler Inhale into the lungs 2 (two) times daily.    . cyclobenzaprine (FLEXERIL) 10 MG tablet Take 1 tablet (10 mg total) by mouth at bedtime. 30 tablet 1  . esomeprazole (NEXIUM) 40 MG capsule Take 40 mg by mouth daily.     . Guaifenesin 1200 MG TB12 Take 1 tablet (1,200 mg total) by mouth 2 (two) times daily. (Patient not taking: Reported on 04/28/2015) 20 each 0  . ibuprofen (ADVIL,MOTRIN) 800 MG tablet Take 1 tablet (800 mg total) by mouth every 8 (eight) hours as needed. (Patient not taking: Reported on 05/18/2015) 30 tablet 0  . meloxicam (MOBIC) 7.5 MG tablet Take 1 tablet (7.5 mg total) by mouth daily. (Patient not taking: Reported on 05/18/2015) 30 tablet 0  . metaxalone (SKELAXIN) 800 MG tablet Take 1 tablet (800 mg total) by mouth daily. (Patient not taking: Reported on 05/18/2015) 30 tablet 0  . promethazine (PHENERGAN) 25 MG tablet Take 1 tablet (25 mg total) by mouth every 6 (six) hours as needed for nausea or vomiting. (Patient not taking: Reported on 05/18/2015) 30 tablet 0   No current facility-administered medications on file prior to visit.   Allergies  Allergen Reactions  . Ocella [Drospirenone-Ethinyl Estradiol] Shortness Of Breath  . Adhesive [Tape] Itching  . Lorazepam Other (See Comments)    "makes me angry"   Family History  Problem Relation Age of Onset  . Crohn's disease Neg Hx   . Arthritis Mother   . Heart disease Father   . Hyperlipidemia Father     PE: BP 112/68 mmHg  Pulse 115  Temp(Src) 97.8 F (36.6 C) (Oral)  Resp 12  Ht 5' 7.5" (1.715 m)  Wt 403 lb (182.8 kg)  BMI 62.15 kg/m2  SpO2 97%  LMP 01/22/2015 Wt Readings from Last 3 Encounters:  05/18/15 403 lb (182.8 kg)  04/28/15 402 lb (182.346 kg)  06/10/14 376 lb (170.552 kg)   Constitutional: overweight, in NAD, no full supraclavicular fat pads Eyes: PERRLA, EOMI, no exophthalmos ENT: moist mucous membranes, no thyromegaly, no cervical  lymphadenopathy Cardiovascular: RRR, No MRG Respiratory: CTA B Gastrointestinal: abdomen soft, NT, ND, BS+ Musculoskeletal: no deformities, strength intact in all 4 Skin: moist, warm; + acne on face, + few shafts of dark terminal hair on chin, + vellum on sideburns, + skin tags, + acanthosis nigricans, no purple, wide, stretch marks Neurological: no tremor with outstretched hands, DTR normal in all 4  ASSESSMENT: 1. PCOS  PLAN: 1.  I had a long discussion with the patient and husband about PCOS. The PCOS term is a misnomer, a patient does not necessarily have to have polycystic ovaries to be diagnosed with the disorder. This is of sum of several conditions, including:  weight gain  insulin resistance (and therefore a higher risk of developing diabetes later in life)  acne  hirsutism  irregular menstrual cycles  decreased fertility. - We also discussed about the fact that the treatment is usually targeted to addressing the problem that concerns the patient the most: acne/hirsutism,  weight gain, or fertility, but there is no single treatment for PCOS.  - The first-line therapy are oral contraceptives. If she is concerned with her weight, we can use metformin; if she is concerned about acne/hirsutism, we can add spironolactone; and if she is concerned about fertility, I could refer her to reproductive endocrinology for possible use of clomiphene. - She agrees to start metformin ER, add low-dose, advancing to 1000 mg twice a day >> will reevaluate in 6 mo - if not starting to have at least 4 menses a year >> will need low dose OCPs (she had HA and arm numbness on higher dose OCPs) - we discussed about Spironolactone >> will hold off for now as hirsutism and acne not very bothersome. Discussed possible SEs from the med and the need for extra protection when using it - she tells me that she plans to get pregnant in ~ 2 years - Please come back for a follow-up appointment in 6 months  Will  check: Orders Placed This Encounter  Procedures  . Prolactin  . TSH  . T4, free  . T3, free  . Testosterone, Free, Total, SHBG  . Luteinizing hormone  . Follicle stimulating hormone  . Estradiol  . Vitamin D (25 hydroxy)  . HgB A1c  . COMPLETE METABOLIC PANEL WITH GFR  . Lipid Profile  . Amb ref to Medical Nutrition Therapy-MNT   - time spent with the patient: 1 hour, of which >50% was spent in obtaining information about her symptoms, reviewing her previous labs, evaluations, and treatments, counseling her about her condition (please see the discussed topics above), and developing a plan to further investigate and treat it; she had a number of questions which I addressed.  Office Visit on 05/18/2015  Component Date Value Ref Range Status  . Prolactin 05/18/2015 8.4   Final   Comment:      Reference Ranges:                  Female:                       2.1 -  17.1 ng/ml                  Female:   Pregnant          9.7 - 208.5 ng/mL                            Non Pregnant      2.8 -  29.2 ng/mL                            Post Menopausal   1.8 -  20.3 ng/mL                      . TSH 05/18/2015 2.04  0.35 - 4.50 uIU/mL Final  . Free T4 05/18/2015 1.13  0.60 - 1.60 ng/dL Final  . T3, Free 05/18/2015 3.7  2.3 - 4.2 pg/mL Final  . Testosterone 05/18/2015 53  10 - 70 ng/dL Final   Comment:           [place] Stage       Female              Female               I              <  30 ng/dL        < 10 ng/dL               II             < 150 ng/dL       < 30 ng/dL               III            100-320 ng/dL     < 35 ng/dL               IV             200-970 ng/dL     15-40 ng/dL               V/Adult        300-890 ng/dL     10-70 ng/dL     . Sex Hormone Binding 05/18/2015 21  17 - 124 nmol/L Final  . Testosterone, Free 05/18/2015 12.2* 0.6 - 6.8 pg/mL Final   Comment:   The concentration of free testosterone is derived from a mathematical expression based on constants for the binding  of testosterone to sex hormone-binding globulin and albumin.   . Testosterone-% Free 05/18/2015 2.3  0.4 - 2.4 % Final  . [place] 05/18/2015 9.35   Final   Comment: Female Reference Range:20-70 yrs     1.5-9.3 mIU/mL>70 yrs       3.1-35.6 mIU/mLFemale Reference Range:Follicular Phase     3.0-16.0 mIU/mLMidcycle             8.7-76.3 mIU/mLLuteal Phase         0.5-16.9 mIU/mL  Post Menopausal      15.9-54.0  mIU/mLPregnant             <1.5 mIU/mLContraceptives       0.7-5.6 mIU/mL   . [facility] 05/18/2015 7.7   Final   Female Reference Range:  1.4-18.1 mIU/mLFemale Reference Range:Follicular Phase          2.5-10.2 mIU/mLMidCycle Peak          3.4-33.4 mIU/mLLuteal Phase          1.5-9.1 mIU/mLPost Menopausal     23.0-116.3 mIU/mLPregnant          <0.3 mIU/mL  . Estradiol, Free 05/18/2015 1.03   Final   Comment:   FEMALE REFERENCE RANGES FOR ESTRADIOL, FREE:     Follicular Stage:  1.09-3.23 pg/mL   Mid-cycle Stage:   0.72-5.89 pg/mL   Luteal Stage:      0.40-5.55 pg/mL   Postmenopausal:    < or = 0.38 pg/mL   . Estradiol 05/18/2015 38   Final   Comment:   FEMALE REFERENCE RANGES FOR ESTRADIOL:     Follicular Stage:  55-732 pg/mL   Mid-cycle Stage:   94-762 pg/mL   Luteal Stage:      48-440 pg/mL   Postmenopausal:    < or = 10 pg/mL   This test was developed and its analytical performance characteristics have been determined by [facility]. It has not been cleared or approved by FDA. This assay has been validated pursuant to the CLIA regulations and is used for clinical purposes.   Marland Kitchen VITD 05/18/2015 9.75* 30.00 - 100.00 ng/mL Final  . Hgb A1c MFr Bld 05/18/2015 5.6  4.6 - 6.5 % Final   Glycemic Control Guidelines for People with Diabetes:Non Diabetic:  <6%Goal of Therapy: <7%Additional Action Suggested:  >8%   .  Sodium 05/18/2015 139  135 - 146 mmol/L Final  . Potassium 05/18/2015 4.0  3.5 - 5.3 mmol/L Final  . Chloride 05/18/2015 106  98 - 110 mmol/L  Final  . CO2 05/18/2015 23  20 - 31 mmol/L Final  . Glucose, Bld 05/18/2015 90  65 - 99 mg/dL Final  . BUN 05/18/2015 12  7 - 25 mg/dL Final  . Creat 05/18/2015 0.62  0.50 - 1.10 mg/dL Final  . Total Bilirubin 05/18/2015 0.4  0.2 - 1.2 mg/dL Final  . Alkaline Phosphatase 05/18/2015 64  33 - 115 U/L Final  . AST 05/18/2015 20  10 - 30 U/L Final  . ALT 05/18/2015 28  6 - 29 U/L Final  . Total Protein 05/18/2015 6.7  6.1 - 8.1 g/dL Final  . Albumin 05/18/2015 3.9  3.6 - 5.1 g/dL Final  . Calcium 05/18/2015 8.9  8.6 - 10.2 mg/dL Final  . GFR, Est African American 05/18/2015 >89  >=60 mL/min Final  . GFR, Est Non African American 05/18/2015 >89  >=60 mL/min Final   Comment:   The estimated GFR is a calculation valid for adults (>=39 years old) that uses the CKD-EPI algorithm to adjust for age and sex. It is   not to be used for children, pregnant women, hospitalized patients,    patients on dialysis, or with rapidly changing kidney function. According to the NKDEP, eGFR >89 is normal, 60-89 shows mild impairment, 30-59 shows moderate impairment, 15-29 shows severe impairment and <15 is ESRD.     Footnotes:  (1) ** Please note change in unit of measure and reference range(s). **     . Cholesterol 05/18/2015 158  0 - 200 mg/dL Final   ATP III Classification       Desirable:  < 200 mg/dL               Borderline High:  200 - 239 mg/dL          High:  > = 240 mg/dL  . Triglycerides 05/18/2015 133.0  0.0 - 149.0 mg/dL Final   Normal:  <150 mg/dLBorderline High:  150 - 199 mg/dL  . HDL 05/18/2015 36.00* >39.00 mg/dL Final  . VLDL 05/18/2015 26.6  0.0 - 40.0 mg/dL Final  . LDL Cholesterol 05/18/2015 96  0 - 99 mg/dL Final  . Total CHOL/HDL Ratio 05/18/2015 4   Final                  Men          Women1/2 Average Risk     3.4          3.3Average Risk          5.0          4.42X Average Risk          9.6          7.13X Average Risk          15.0          11.0                      . NonHDL  05/18/2015 122.14   Final   NOTE:  Non-HDL goal should be 30 mg/dL higher than patient's LDL goal (i.e. LDL goal of < 70 mg/dL, would have non-HDL goal of < 100 mg/dL)  . Sodium 05/18/2015 137  135 - 145 mEq/L Final  . Potassium 05/18/2015 3.8  3.5 - 5.1 mEq/L Final  .  Chloride 05/18/2015 104  96 - 112 mEq/L Final  . CO2 05/18/2015 24  19 - 32 mEq/L Final  . Glucose, Bld 05/18/2015 89  70 - 99 mg/dL Final  . BUN 05/18/2015 13  6 - 23 mg/dL Final  . Creatinine, Ser 05/18/2015 0.62  0.40 - 1.20 mg/dL Final  . Calcium 05/18/2015 9.2  8.4 - 10.5 mg/dL Final  . GFR 05/18/2015 127.12  >60.00 mL/min Final   Prolactin, LH, FSH, TFTs, CMP, hemoglobin A1c, cholesterol levels all normal, except slightly low HDL.  Vitamin D level is very low, will start ergocalciferol 50,000 units 2 a week for 8 weeks and then start 4000 units vitamin D3 daily. We'll recheck her vitamin D at next visit.  Testosterone levels are high. I would suggest starting a low-dose OCP, as discussed at the time of the visit. Estradiol is low, therefore, I do not believe that the testosterone is high due to ovulation.   I will d/w pt to start Loestrin - preferably LoEstrin 24 (24 active days - 4 placebo days).  Msg sent: Dear Marolyn Hammock, All labs are now back and they confirm PCOS. Your testosterone is still high, so I would suggest that you start a low dose oral contraception. Ocella contained 30 mcg of estrogen, while LoEstrin has 20 mcg. I can send this to your pharmacy, and I would like you to come back in 1 week after you start this for a nurse visit for blood pressure check. Please call our main office number [phone]) to schedule the nurse appointment.  Please let me know if you like me to send this to your pharmacy. Sincerely, Philemon Kingdom MD  Pt agrees to start LoEstrin. Will send rx.

## 2015-05-18 NOTE — Patient Instructions (Signed)
Please stop at the lab.  Please start Metformin ER 500 mg with dinner x 4 days. If you tolerate this well, add another Metformin tablet (500 mg) with breakfast x 4 days. If you tolerate this well, add another metformin tablet with dinner (total 1000 mg) x 4 days. If you tolerate this well, add another metformin tablet with breakfast (total 1000 mg). Continue with 1000 mg of metformin 2x a day with breakfast and dinner.  Please come back for a follow-up appointment in 6 months.  Please schedule an appt with Denny Levy [facility] in [facility].  Please check the following website: [email or web address]  Polycystic Ovarian Syndrome Polycystic ovarian syndrome (PCOS) is a common hormonal disorder among women of reproductive age. Most women with PCOS grow many small cysts on their ovaries. PCOS can cause problems with your periods and make it difficult to get pregnant. It can also cause an increased risk of miscarriage with pregnancy. If left untreated, PCOS can lead to serious health problems, such as diabetes and heart disease. CAUSES The cause of PCOS is not fully understood, but genetics may be a factor. SIGNS AND SYMPTOMS   Infrequent or no menstrual periods.   Inability to get pregnant (infertility) because of not ovulating.   Increased growth of hair on the face, chest, stomach, back, thumbs, thighs, or toes.   Acne, oily skin, or dandruff.   Pelvic pain.   Weight gain or obesity, usually carrying extra weight around the waist.   Type 2 diabetes.   High cholesterol.   High blood pressure.   Female-pattern baldness or thinning hair.   Patches of thickened and dark brown or black skin on the neck, arms, breasts, or thighs.   Tiny excess flaps of skin (skin tags) in the armpits or neck area.   Excessive snoring and having breathing stop at times while asleep (sleep apnea).   Deepening of the voice.   Gestational diabetes when pregnant.  DIAGNOSIS  There  is no single test to diagnose PCOS.   Your health care provider will:   Take a medical history.   Perform a pelvic exam.   Have ultrasonography done.   Check your female and female hormone levels.   Measure glucose or sugar levels in the blood.   Do other blood tests.   If you are producing too many female hormones, your health care provider will make sure it is from PCOS. At the physical exam, your health care provider will want to evaluate the areas of increased hair growth. Try to allow natural hair growth for a few days before the visit.   During a pelvic exam, the ovaries may be enlarged or swollen because of the increased number of small cysts. This can be seen more easily by using vaginal ultrasonography or screening to examine the ovaries and lining of the uterus (endometrium) for cysts. The uterine lining may become thicker if you have not been having a regular period.  TREATMENT  Because there is no cure for PCOS, it needs to be managed to prevent problems. Treatments are based on your symptoms. Treatment is also based on whether you want to have a baby or whether you need contraception.  Treatment may include:   Progesterone hormone to start a menstrual period.   Birth control pills to make you have regular menstrual periods.   Medicines to make you ovulate, if you want to get pregnant.   Medicines to control your insulin.   Medicine to control your blood  pressure.   Medicine and diet to control your high cholesterol and triglycerides in your blood.  Medicine to reduce excessive hair growth.  Surgery, making small holes in the ovary, to decrease the amount of female hormone production. This is done through a long, lighted tube (laparoscope) placed into the pelvis through a tiny incision in the lower abdomen.  HOME CARE INSTRUCTIONS  Only take over-the-counter or prescription medicine as directed by your health care provider.  Pay attention to the foods  you eat and your activity levels. This can help reduce the effects of PCOS.  Keep your weight under control.  Eat foods that are low in carbohydrate and high in fiber.  Exercise regularly. SEEK MEDICAL CARE IF:  Your symptoms do not get better with medicine.  You have new symptoms. Document Released: 12/12/2004 Document Revised: 06/08/2013 Document Reviewed: 02/03/2013 [facility] Patient Information 2015 [place], [place]. This information is not intended to replace advice given to you by your health care provider. Make sure you discuss any questions you have with your health care provider.

## 2015-05-19 LAB — PROLACTIN: PROLACTIN: 8.4 ng/mL

## 2015-05-21 ENCOUNTER — Encounter: Payer: Self-pay | Admitting: Internal Medicine

## 2015-05-21 LAB — TESTOSTERONE, FREE, TOTAL, SHBG
SEX HORMONE BINDING: 21 nmol/L (ref 17–124)
TESTOSTERONE FREE: 12.2 pg/mL — AB (ref 0.6–6.8)
TESTOSTERONE-% FREE: 2.3 % (ref 0.4–2.4)
TESTOSTERONE: 53 ng/dL (ref 10–70)

## 2015-05-22 MED ORDER — VITAMIN D (ERGOCALCIFEROL) 1.25 MG (50000 UNIT) PO CAPS: 50000.0000 [IU] | ORAL_CAPSULE | ORAL | Status: DC

## 2015-05-24 LAB — ESTRADIOL, FREE
Estradiol, Free: 1.03 pg/mL
Estradiol: 38 pg/mL

## 2015-05-25 ENCOUNTER — Encounter: Payer: Self-pay | Admitting: Internal Medicine

## 2015-05-25 MED ORDER — NORETHIN ACE-ETH ESTRAD-FE 1-20 MG-MCG(24) PO TABS: 1.0000 | ORAL_TABLET | Freq: Every day | ORAL | Status: DC

## 2015-05-26 ENCOUNTER — Telehealth: Payer: Self-pay | Admitting: *Deleted

## 2015-05-26 NOTE — Telephone Encounter (Signed)
Mychart message sent.

## 2015-06-05 ENCOUNTER — Encounter: Payer: Self-pay | Admitting: Internal Medicine

## 2015-06-05 ENCOUNTER — Encounter (HOSPITAL_COMMUNITY): Payer: Self-pay | Admitting: Emergency Medicine

## 2015-06-05 DIAGNOSIS — R079 Chest pain, unspecified: Secondary | ICD-10-CM | POA: Diagnosis present

## 2015-06-05 DIAGNOSIS — Z8719 Personal history of other diseases of the digestive system: Secondary | ICD-10-CM | POA: Diagnosis not present

## 2015-06-05 DIAGNOSIS — F329 Major depressive disorder, single episode, unspecified: Secondary | ICD-10-CM | POA: Diagnosis not present

## 2015-06-05 DIAGNOSIS — R0789 Other chest pain: Secondary | ICD-10-CM | POA: Diagnosis not present

## 2015-06-05 DIAGNOSIS — Z7951 Long term (current) use of inhaled steroids: Secondary | ICD-10-CM | POA: Diagnosis not present

## 2015-06-05 DIAGNOSIS — Z79899 Other long term (current) drug therapy: Secondary | ICD-10-CM | POA: Insufficient documentation

## 2015-06-05 DIAGNOSIS — M79605 Pain in left leg: Secondary | ICD-10-CM | POA: Diagnosis not present

## 2015-06-05 DIAGNOSIS — Z8679 Personal history of other diseases of the circulatory system: Secondary | ICD-10-CM | POA: Insufficient documentation

## 2015-06-05 NOTE — ED Notes (Signed)
Pt. reports left foot/left lower leg pain and swelling onset this morning , denies injury , pt. concerned about blood clots due to family history and oral contraceptive  , respirations unlabored / denies fever or chills.

## 2015-06-06 ENCOUNTER — Telehealth (HOSPITAL_COMMUNITY): Payer: Self-pay | Admitting: Emergency Medicine

## 2015-06-06 ENCOUNTER — Emergency Department (HOSPITAL_COMMUNITY): Payer: BLUE CROSS/BLUE SHIELD

## 2015-06-06 ENCOUNTER — Ambulatory Visit (HOSPITAL_COMMUNITY): Payer: BLUE CROSS/BLUE SHIELD

## 2015-06-06 ENCOUNTER — Emergency Department (HOSPITAL_COMMUNITY)
Admission: EM | Admit: 2015-06-06 | Discharge: 2015-06-06 | Disposition: A | Discharge status: Home / Self Care | Payer: BLUE CROSS/BLUE SHIELD | Source: Home / Self Care | Attending: Emergency Medicine | Admitting: Emergency Medicine

## 2015-06-06 ENCOUNTER — Emergency Department (HOSPITAL_COMMUNITY)
Admission: EM | Admit: 2015-06-06 | Discharge: 2015-06-06 | Disposition: A | Discharge status: Home / Self Care | Payer: BLUE CROSS/BLUE SHIELD | Attending: Emergency Medicine | Admitting: Emergency Medicine

## 2015-06-06 ENCOUNTER — Encounter (HOSPITAL_COMMUNITY): Payer: Self-pay | Admitting: *Deleted

## 2015-06-06 ENCOUNTER — Ambulatory Visit (HOSPITAL_BASED_OUTPATIENT_CLINIC_OR_DEPARTMENT_OTHER)
Admission: RE | Admit: 2015-06-06 | Discharge: 2015-06-06 | Disposition: A | Discharge status: Home / Self Care | Payer: BLUE CROSS/BLUE SHIELD | Source: Ambulatory Visit | Attending: Emergency Medicine | Admitting: Emergency Medicine

## 2015-06-06 DIAGNOSIS — M79609 Pain in unspecified limb: Secondary | ICD-10-CM

## 2015-06-06 DIAGNOSIS — M7989 Other specified soft tissue disorders: Secondary | ICD-10-CM

## 2015-06-06 DIAGNOSIS — R0789 Other chest pain: Secondary | ICD-10-CM

## 2015-06-06 DIAGNOSIS — M79605 Pain in left leg: Secondary | ICD-10-CM

## 2015-06-06 HISTORY — DX: Gastric ulcer, unspecified as acute or chronic, without hemorrhage or perforation: K25.9

## 2015-06-06 LAB — BASIC METABOLIC PANEL
ANION GAP: 8 (ref 5–15)
BUN: 9 mg/dL (ref 6–20)
CALCIUM: 9.1 mg/dL (ref 8.9–10.3)
CO2: 26 mmol/L (ref 22–32)
Chloride: 102 mmol/L (ref 101–111)
Creatinine, Ser: 0.69 mg/dL (ref 0.44–1.00)
GLUCOSE: 107 mg/dL — AB (ref 65–99)
Potassium: 3.8 mmol/L (ref 3.5–5.1)
SODIUM: 136 mmol/L (ref 135–145)

## 2015-06-06 LAB — CBC
HCT: 38.5 % (ref 36.0–46.0)
HEMOGLOBIN: 12.9 g/dL (ref 12.0–15.0)
MCH: 28.4 pg (ref 26.0–34.0)
MCHC: 33.5 g/dL (ref 30.0–36.0)
MCV: 84.6 fL (ref 78.0–100.0)
Platelets: 344 10*3/uL (ref 150–400)
RBC: 4.55 MIL/uL (ref 3.87–5.11)
RDW: 13.2 % (ref 11.5–15.5)
WBC: 9.9 10*3/uL (ref 4.0–10.5)

## 2015-06-06 LAB — I-STAT TROPONIN, ED: Troponin i, poc: 0 ng/mL (ref 0.00–0.08)

## 2015-06-06 LAB — I-STAT BETA HCG BLOOD, ED (MC, WL, AP ONLY)

## 2015-06-06 MED ORDER — ACETAMINOPHEN 325 MG PO TABS
650.0000 mg | ORAL_TABLET | Freq: Once | ORAL | Status: AC
  Administered 2015-06-06: 650 mg via ORAL
  Filled 2015-06-06: qty 2

## 2015-06-06 MED ORDER — IOHEXOL 350 MG/ML SOLN
100.0000 mL | Freq: Once | INTRAVENOUS | Status: AC | PRN
  Administered 2015-06-06: 100 mL via INTRAVENOUS

## 2015-06-06 MED ORDER — ENOXAPARIN SODIUM 150 MG/ML ~~LOC~~ SOLN
1.0000 mg/kg | Freq: Once | SUBCUTANEOUS | Status: AC
  Administered 2015-06-06: 185 mg via SUBCUTANEOUS
  Filled 2015-06-06: qty 2

## 2015-06-06 NOTE — Telephone Encounter (Signed)
Spoke with pt. She stopped b/c, they did not find a blood clot; but pt stated that due to the swelling and chest pains, she is back in the ED. Be advised.

## 2015-06-06 NOTE — ED Provider Notes (Signed)
CSN: [id number]     Arrival date & time 06/06/15  1409 History   First MD Initiated Contact with Patient 06/06/15 [phone]     Chief Complaint  Patient presents with  . Chest Pain     (Consider location/radiation/quality/duration/timing/severity/associated sxs/prior Treatment) HPI   24 year old morbidly obese female with polycystic ovarian syndrome who presents today complaining of leg swelling, chest pain, and dyspnea. She was seen here last night and had complaints of leg swelling. She received a shot of Lovenox and was sent for a follow up ultrasound of her left lower extremity today. This was performed and reported as negative. However she had developed some dyspnea last night while she was here in the department which has continued some today. She has recently been on birth control which she stopped yesterday. She has no history of immobilization or prior DVT or PE. She does have some family history of DVT. She has noted left lower leg swelling over the past several days.  Past Medical History  Diagnosis Date  . PCOS (polycystic ovarian syndrome)   . Depression   . PONV (postoperative nausea and vomiting)   . 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 Gastric ulcer    Past Surgical History  Procedure Laterality Date  . Wisdom tooth extraction Left 09/2013  . Esophagogastroduodenoscopy (egd) with propofol N/A 02/09/2014    Procedure: ESOPHAGOGASTRODUODENOSCOPY (EGD) WITH PROPOFOL;  Surgeon: Theda Belfast, MD;  Location: [facility] ENDOSCOPY;  Service: Endoscopy;  Laterality: N/A;   Family History  Problem Relation Age of Onset  . Crohn's disease Neg Hx   . Arthritis Mother   . Heart disease Father   . Hyperlipidemia Father    Social History  Substance Use Topics  . Smoking status: Never Smoker   . Smokeless tobacco: None  . Alcohol Use: Yes   OB History    No data available     Review of Systems  All other systems reviewed and are negative.     Allergies  Ocella;  Adhesive; and Lorazepam  Home Medications   Prior to Admission medications   Medication Sig Start Date End Date Taking? Authorizing Provider  albuterol (PROVENTIL HFA;VENTOLIN HFA) 108 (90 BASE) MCG/ACT inhaler Inhale 1 puff into the lungs every 6 (six) hours as needed for wheezing or shortness of breath.   Yes Historical Provider, MD  beclomethasone (QVAR) 80 MCG/ACT inhaler Inhale into the lungs 2 (two) times daily.   Yes Historical Provider, MD  cyclobenzaprine (FLEXERIL) 10 MG tablet Take 1 tablet (10 mg total) by mouth at bedtime. 04/28/15  Yes Collie Siad English, PA  esomeprazole (NEXIUM) 40 MG capsule Take 40 mg by mouth daily as needed (heart burn).    Yes Historical Provider, MD  metFORMIN (GLUCOPHAGE-XR) 500 MG 24 hr tablet Take 2 tablets (1,000 mg total) by mouth 2 (two) times daily with a meal. Patient taking differently: Take 500 mg by mouth 2 (two) times daily with a meal.  05/18/15  Yes Carlus Pavlov, MD  promethazine (PHENERGAN) 25 MG tablet Take 1 tablet (25 mg total) by mouth every 6 (six) hours as needed for nausea or vomiting. 12/21/13  Yes Rodolph Bong, MD  sucralfate (CARAFATE) 1 G tablet Take 1 g by mouth as needed (heartburn).    Yes Historical Provider, MD  Vitamin D, Ergocalciferol, (DRISDOL) 50000 UNITS CAPS capsule Take 1 capsule (50,000 Units total) by mouth 2 (two) times a week. 05/22/15  Yes Carlus Pavlov, MD  Norethindrone  Acetate-Ethinyl Estrad-FE (LOESTRIN 24 FE) 1-20 MG-MCG(24) tablet Take 1 tablet by mouth daily. Patient not taking: Reported on 06/06/2015 05/25/15   Carlus Pavlov, MD   BP 104/43 mmHg  Pulse 88  Temp(Src) 98.1 F (36.7 C) (Oral)  Resp 18  SpO2 98%  LMP 01/22/2015 Physical Exam  Constitutional: She is oriented to person, place, and time. She appears well-developed and well-nourished.  Morbidly obese  HENT:  Head: Normocephalic and atraumatic.  Right Ear: External ear normal.  Left Ear: External ear normal.  Nose: Nose normal.   Mouth/Throat: Oropharynx is clear and moist.  Eyes: Conjunctivae and EOM are normal. Pupils are equal, round, and reactive to light.  Neck: Normal range of motion. Neck supple. No JVD present. No tracheal deviation present. No thyromegaly present.  Cardiovascular: Normal rate, regular rhythm, normal heart sounds and intact distal pulses.   Pulmonary/Chest: Effort normal and breath sounds normal. She has no wheezes.  Abdominal: Soft. Bowel sounds are normal. She exhibits no mass. There is no tenderness. There is no guarding.  Musculoskeletal: Normal range of motion.  Mild pedal edema of left foot dorsal pedalis pulses in. Right pedal pulse intact and no tenderness of the right lower extremity.  Lymphadenopathy:    She has no cervical adenopathy.  Neurological: She is alert and oriented to person, place, and time. She has normal reflexes. No cranial nerve deficit or sensory deficit. Gait normal. GCS eye subscore is 4. GCS verbal subscore is 5. GCS motor subscore is 6.  Reflex Scores:      Bicep reflexes are 2+ on the right side and 2+ on the left side.      Patellar reflexes are 2+ on the right side and 2+ on the left side. Strength is normal and equal throughout. Cranial nerves grossly intact. Patient fluent. No gross ataxia and patient able to ambulate without difficulty.  Skin: Skin is warm and dry.  Psychiatric: She has a normal mood and affect. Her behavior is normal. Judgment and thought content normal.  Nursing note and vitals reviewed.   ED Course  Procedures (including critical care time) Labs Review Labs Reviewed  BASIC METABOLIC PANEL - Abnormal; Notable for the following:    Glucose, Bld 107 (*)    All other components within normal limits  CBC  I-STAT BETA HCG BLOOD, ED ([facility], [facility], [facility] ONLY)  I-STAT TROPOININ, ED    Imaging Review Dg Chest 2 View  06/06/2015   CLINICAL DATA:  LEFT and mid chest pain with pressure off and on since this morning, some shortness of breath,  LEFT foot and leg swelling and pain, CHF, began taking new oral contraceptive  EXAM: CHEST  2 VIEW  COMPARISON:  06/10/2014  FINDINGS: Upper normal heart size.  Normal mediastinal contours and pulmonary vascularity.  Bronchitic changes without infiltrate, pleural effusion or pneumothorax.  No acute osseous findings.  IMPRESSION: Mild bronchitic changes.   Electronically Signed   By: Ulyses Southward M.D.   On: 06/06/2015 15:26   I have personally reviewed and evaluated these images and lab results as part of my medical decision-making.   EKG Interpretation   Date/Time:  Wednesday June 06 2015 14:20:23 EDT Ventricular Rate:  110 PR Interval:  142 QRS Duration: 84 QT Interval:  310 QTC Calculation: 419 R Axis:   12 Text Interpretation:  Sinus tachycardia Nonspecific T wave abnormality  Abnormal ECG Confirmed by Kanya Potteiger MD, Duwayne Heck ([id number]) on 06/06/2015 4:30:57 PM      MDM   Final  diagnoses:  Lower extremity pain, left  Other chest pain   24 year old female with risk factors for DVT/pulmonary embolism of morbid obesity, hormones, who has a negative Doppler of her left lower extremity, but is dyspneic with chest pain and is tachycardic at 108. Given her risk factors she will have a CT angiogram of the chest. She has a history of gastric ulcer which has bled in the past. She has not noted any bleeding in the past several months and has not had any problems since her Lovenox shot last night. However, given her risk and are negative Doppler she is not having anti-coagulation continued until the results of the CT angiogram are seen.  Patient with negative CT angiogram. She is continuing to complain of leg pain. I have discussed return precautions and need for follow-up. She is given resource guide for follow-up. She has remained hemodynamically stable here. I discussed the results with the patient, her husband, and the patient's mother and they voice understanding.  Margarita Grizzle, MD 06/06/15 2056

## 2015-06-06 NOTE — ED Notes (Signed)
Patient transported to CT 

## 2015-06-06 NOTE — ED Notes (Signed)
Pt stable, ambulatory, states understanding of discharge instructions 

## 2015-06-06 NOTE — ED Notes (Signed)
Pt in CT.

## 2015-06-06 NOTE — ED Notes (Signed)
Pt is here with chest pain and left leg swelling.  Pt was given a blood thinner last nite and doppler was negative today.  Pt continues to have chest pain to mid chest that is intermittent.  Pt reports some sob with the pain.

## 2015-06-06 NOTE — ED Notes (Signed)
Called CT to inquire where pt is on list for CT.

## 2015-06-06 NOTE — Telephone Encounter (Signed)
Patient Name: Cassandra Ibarra Gender: Female DOB: 16-Aug-1991 Age: 24 Y 9 M 1 D Return Phone Number: [phone] (Primary) Address: City/State/Zip: [place] Client [facility] Night - Client Client Site [facility] Physician Carlus Pavlov Contact Type Call Call Type Triage / Clinical Relationship To Patient Self Return Phone Number [phone] (Primary) Chief Complaint Leg Swelling And Edema Initial Comment Caller states she just started taking birth control, normally the pills don't react well . Her left foot is swollen 10:46 Leg is swelling and feeling alot of pain in the leg and foot. PreDisposition Go to ED Nurse Assessment Nurse: Lucretia Field, RN, Santina Evans Date/Time Lamount Cohen Time): 06/05/2015 10:51:40 PM Confirm and document reason for call. If symptomatic, describe symptoms. ---Caller states she just started taking birth control, normally the pills don't react well . She started about 1 1/2 week ago. Her left foot is swollen 10:46 Leg is swelling and feeling a lot of pain in the leg and foot. The swollen foot is hot. She noticed it last night.

## 2015-06-06 NOTE — Discharge Instructions (Signed)
[name], you may have a blood clot in your legs.  You were given medication to treat that and need to come back tomorrow for an ultrasound.  If any symptoms worsen before then, come back to the ED immediately.  Thank you. Edema is an abnormal buildup of fluids. It is more common in your legs and thighs. Painless swelling of the feet and ankles is more likely as a person ages. It also is common in looser skin, like around your eyes. HOME CARE   Keep the affected body part above the level of the heart while lying down.  Do not sit still or stand for a long time.  Do not put anything right under your knees when you lie down.  Do not wear tight clothes on your upper legs.  Exercise your legs to help the puffiness (swelling) go down.  Wear elastic bandages or support stockings as told by your doctor.  A low-salt diet may help lessen the puffiness.  Only take medicine as told by your doctor. GET HELP IF:  Treatment is not working.  You have heart, liver, or kidney disease and notice that your skin looks puffy or shiny.  You have puffiness in your legs that does not get better when you raise your legs.  You have sudden weight gain for no reason. GET HELP RIGHT AWAY IF:   You have shortness of breath or chest pain.  You cannot breathe when you lie down.  You have pain, redness, or warmth in the areas that are puffy.  You have heart, liver, or kidney disease and get edema all of a sudden.  You have a fever and your symptoms get worse all of a sudden. MAKE SURE YOU:   Understand these instructions.  Will watch your condition.  Will get help right away if you are not doing well or get worse.   This information is not intended to replace advice given to you by your health care provider. Make sure you discuss any questions you have with your health care provider.   Document Released: 02/04/2008 Document Revised: 08/23/2013 Document Reviewed: 06/10/2013 Elsevier  Interactive Patient Education [employer]

## 2015-06-06 NOTE — ED Notes (Signed)
CT called and informed pt has appropriate IV for CTA.

## 2015-06-06 NOTE — Progress Notes (Signed)
VASCULAR LAB PRELIMINARY  PRELIMINARY  PRELIMINARY  PRELIMINARY  Left lower extremity venous duplex completed.    Preliminary report:  Left:  No evidence of DVT, superficial thrombosis, or Baker's cyst.  Cassandra Ibarra, RVT 06/06/2015, 1:37 PM

## 2015-06-06 NOTE — Telephone Encounter (Signed)
Patient stated that they did not find a clot in her leg but her leg is hurting and still swollen, please advise

## 2015-06-06 NOTE — ED Provider Notes (Signed)
CSN: [id number]     Arrival date & time 06/05/15  2317 History  By signing my name below, I, Freida Busman, attest that this documentation has been prepared under the direction and in the presence of Tomasita Crumble, MD . Electronically Signed: Freida Busman, Scribe. 06/06/2015. 2:00 AM.  Chief Complaint  Patient presents with  . Leg Swelling    The history is provided by the patient. No language interpreter was used.   HPI Comments:  Cassandra Ibarra is a 24 y.o. female who presents to the Emergency Department complaining of moderate swelling to her left calf with cramping 8/10  pain that has progressively worsened since onset about 2 days ago. Pt also reports pain to her left foot.  Pt uses BCP. She deneis recent travel, smoking history, h/o DVT and surgery. She also denies fever, chills and rash. No alleviating factors noted. Pt notes family h/o blood clots- Father.   Past Medical History  Diagnosis Date  . PCOS (polycystic ovarian syndrome)   . Depression   . PONV (postoperative nausea and vomiting)   . 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 Wisdom tooth extraction Left 09/2013  . Esophagogastroduodenoscopy (egd) with propofol N/A 02/09/2014    Procedure: ESOPHAGOGASTRODUODENOSCOPY (EGD) WITH PROPOFOL;  Surgeon: Theda Belfast, MD;  Location: [facility] ENDOSCOPY;  Service: Endoscopy;  Laterality: N/A;   Family History  Problem Relation Age of Onset  . Crohn's disease Neg Hx   . Arthritis Mother   . Heart disease Father   . Hyperlipidemia Father    Social History  Substance Use Topics  . Smoking status: Never Smoker   . Smokeless tobacco: None  . Alcohol Use: Yes   OB History    No data available     Review of Systems  A complete 10 system review of systems was obtained and all systems are negative except as noted in the HPI and PMH.   Allergies  Ocella; Adhesive; and Lorazepam  Home Medications   Prior to  Admission medications   Medication Sig Start Date End Date Taking? Authorizing Provider  albuterol (PROVENTIL HFA;VENTOLIN HFA) 108 (90 BASE) MCG/ACT inhaler Inhale 1 puff into the lungs every 6 (six) hours as needed for wheezing or shortness of breath.   Yes Historical Provider, MD  beclomethasone (QVAR) 80 MCG/ACT inhaler Inhale into the lungs 2 (two) times daily.   Yes Historical Provider, MD  cyclobenzaprine (FLEXERIL) 10 MG tablet Take 1 tablet (10 mg total) by mouth at bedtime. 04/28/15  Yes Collie Siad English, PA  esomeprazole (NEXIUM) 40 MG capsule Take 40 mg by mouth daily.    Yes Historical Provider, MD  ibuprofen (ADVIL,MOTRIN) 800 MG tablet Take 1 tablet (800 mg total) by mouth every 8 (eight) hours as needed. 06/11/14  Yes Christopher Lawyer, PA-C  metFORMIN (GLUCOPHAGE-XR) 500 MG 24 hr tablet Take 2 tablets (1,000 mg total) by mouth 2 (two) times daily with a meal. 05/18/15  Yes Carlus Pavlov, MD  Norethindrone Acetate-Ethinyl Estrad-FE (LOESTRIN 24 FE) 1-20 MG-MCG(24) tablet Take 1 tablet by mouth daily. 05/25/15  Yes Carlus Pavlov, MD  promethazine (PHENERGAN) 25 MG tablet Take 1 tablet (25 mg total) by mouth every 6 (six) hours as needed for nausea or vomiting. 12/21/13  Yes Rodolph Bong, MD  sucralfate (CARAFATE) 1 G tablet Take 1 g by mouth as needed.   Yes Historical Provider, MD  Vitamin D, Ergocalciferol, (DRISDOL) 50000  UNITS CAPS capsule Take 1 capsule (50,000 Units total) by mouth 2 (two) times a week. 05/22/15  Yes Carlus Pavlov, MD   BP 110/53 mmHg  Pulse 92  Temp(Src) 98 F (36.7 C) (Oral)  Resp 14  Ht  (1.702 m)  Wt 402 lb (182.346 kg)  BMI 62.95 kg/m2  SpO2 99% Physical Exam  Constitutional: She is oriented to person, place, and time. She appears well-developed. No distress.  Obese  HENT:  Head: Normocephalic and atraumatic.  Nose: Nose normal.  Mouth/Throat: Oropharynx is clear and moist. No oropharyngeal exudate.  Eyes: Conjunctivae and EOM are  normal. Pupils are equal, round, and reactive to light. No scleral icterus.  Neck: Normal range of motion. Neck supple. No JVD present. No tracheal deviation present. No thyromegaly present.  Cardiovascular: Normal rate, regular rhythm and normal heart sounds.  Exam reveals no gallop and no friction rub.   No murmur heard. Pulmonary/Chest: Effort normal and breath sounds normal. No respiratory distress. She has no wheezes. She exhibits no tenderness.  Abdominal: Soft. Bowel sounds are normal. She exhibits no distension and no mass. There is no tenderness. There is no rebound and no guarding.  Musculoskeletal: Normal range of motion. She exhibits edema. She exhibits no tenderness.  Positive homans sign LLE  Lymphadenopathy:    She has no cervical adenopathy.  Neurological: She is alert and oriented to person, place, and time. No cranial nerve deficit. She exhibits normal muscle tone.  Skin: Skin is warm and dry. No rash noted. No erythema. No pallor.  Nursing note and vitals reviewed.   ED Course  Procedures   DIAGNOSTIC STUDIES:  Oxygen Saturation is 100% on RA, normal by my interpretation.    COORDINATION OF CARE:  1:44 AM Discussed treatment plan with pt at bedside and pt agreed to plan.  Labs Review Labs Reviewed - No data to display  Imaging Review No results found.    EKG Interpretation None      MDM   Final diagnoses:  Leg swelling   Patient presents to the ED for leg swelling, cramping and pain.  She denies any chest pain or shortness of breath to me.  She has concerned for DVT and physical exam does support this.  She was given lovenox and instructed to return for [place] testing tomorrow.  She demonstrates good understanding of the plan, appears well and in NAD.  Her VS remain within her normal limits, I rechecked her HR prior to DC and it was 95.  She is safe for DC.   I, Norma Montemurro, personally performed the services described in this documentation. All medical  record entries made by the scribe were at my direction and in my presence.  I have reviewed the chart and discharge instructions and agree that the record reflects my personal performance and is accurate and complete. Brittne Kawasaki.  06/06/2015. 4:48 PM.      Tomasita Crumble, MD 06/06/15 1650

## 2015-06-06 NOTE — Discharge Instructions (Signed)
°Emergency Department Resource Guide °1) Find a Doctor and Pay Out of Pocket °Although you won't have to find out who is covered by your insurance plan, it is a good idea to ask around and get recommendations. You will then need to call the office and see if the doctor you have chosen will accept you as a new patient and what types of options they offer for patients who are self-pay. Some doctors offer discounts or will set up payment plans for their patients who do not have insurance, but you will need to ask so you aren't surprised when you get to your appointment. ° °2) Contact Your Local Health Department °Not all health departments have doctors that can see patients for sick visits, but many do, so it is worth a call to see if yours does. If you don't know where your local health department is, you can check in your phone book. The CDC also has a tool to help you locate your state's health department, and many state websites also have listings of all of their local health departments. ° °3) Find a Walk-in Clinic °If your illness is not likely to be very severe or complicated, you may want to try a walk in clinic. These are popping up all over the country in pharmacies, drugstores, and shopping centers. They're usually staffed by nurse practitioners or physician assistants that have been trained to treat common illnesses and complaints. They're usually fairly quick and inexpensive. However, if you have serious medical issues or chronic medical problems, these are probably not your best option. ° °No Primary Care Doctor: °- Call [facility] at  832-8000 - they can help you locate a primary care doctor that  accepts your insurance, provides certain services, etc. °- Physician Referral Service- 1-800-533-3463 ° °Chronic Pain Problems: °Organization         Address  Phone   Notes  °[facility]  (336) 297-2271 Patients need to be referred by their primary care doctor.  ° °Medication  Assistance: °Organization         Address  Phone   Notes  °[place] [facility] [address] °[place], [place] [address] (336) 641-8030 --Must be a resident of [place] °-- Must have NO insurance coverage whatsoever (no Medicaid/ Medicare, etc.) °-- The pt. MUST have a primary care doctor that directs their care regularly and follows them in the community °  °[facility]  (866) 331-1348   °[employer]  (888) 892-1162   ° °Agencies that provide inexpensive medical care: °Organization         Address  Phone   Notes  °[facility]  (336) 832-8035   °[facility]    (336) 832-7272   °[facility] [address] °[place], [place] [address] (336) 832-4777   °[facility] [address], °[place] (336) 271-4999   °[facility]    (336) 373-0678   °[facility]    (336) 272-1050   °[facility] ° [address], [place] Phone:  (336) 832-4444, Fax:  (336) 832-4440 Hours of Operation:  9 am - 6 pm, M-F.  Also accepts Medicaid/Medicare and self-pay.  °[facility] ° [address], [place] Phone: (336) 832-3150, Fax: (336) [phone]. Hours of Operation:  8:30 am - 5:30 pm, M-F.  Also accepts Medicaid and self-pay.  °[facility] [place] [address], [place] Phone: (336) 878-6027   °[facility] [address], [place], [place] (336)723-1848, Ext. 123 Mondays & Thursdays: 7-9 AM.  First 15 patients are seen on a first come, first serve basis. °  ° °Medicaid-accepting [place] Providers: ° °Organization         Address  Phone   Notes  °[facility] [address], [place] (336) 641-2100 Also accepts self-pay patients.  °[facility] [address], [place] ° (336) 856-9996   °[facility] [address], [place]  (336) 288-8857   °[facility] [address] [place] [address], [place] (336) 299-7000   °Veita Bland [address], [place]  ° (336) 373-1557 Only accepts [place] Access Medicaid patients after they have their name applied to their card.  ° °Self-Pay (no insurance) in [place]: ° °Organization         Address  Phone   Notes  °Sickle Cell Patients, [facility] [address], [place] (336) 832-1970   °[facility] [address], [place] (336) 832-4400   °[facility] ° [address], [place] (336) 992-4800   °[facility]/Dr. Osei-Bonsu ° [address] [place] [address], [place] or [address], [place] (336) [phone] Phone number for both [place] and [place] locations is the same.  °[facility] [address], [place] (336) 299-0000   °[facility] [address] [place] [address], [place] or [address] (336) 852-7530 °(336) 878-2260   °[facility] [address], [place] (336) 350-1642, phone; (336) 294-5005, fax Sees patients 1st and 3rd Saturday of every month.  Must not qualify for public or private insurance (i.e. Medicaid, Medicare, [place] Health Choice, Veterans' Benefits) • Household income should be no more than 200% of the poverty level •The clinic cannot treat you if you are pregnant or think you are pregnant • Sexually transmitted diseases are not treated at the clinic.  ° ° °Dental Care: °Organization         Address  Phone  Notes  °[place] [facility] [address], [place] (336) 641-6152 Accepts children up to age 21 who are enrolled in Medicaid or [place] Health Choice; pregnant women with a Medicaid card; and children who have applied for Medicaid or [place] Health Choice, but were declined, whose parents can pay a reduced fee at time of service.  °[place]  [facility] [place]  [address], [place] (336) 641-7733 Accepts children up to age 21 who are enrolled in Medicaid or [place] Health Choice; pregnant women with a Medicaid card; and children who have applied for Medicaid or [place] Health Choice, but were declined, whose parents can pay a reduced fee at time of service.  °[facility] ° [address], [place] (336) 641-4533 Patients are seen by appointment only. Walk-ins are not accepted. [facility] will see patients 18 years of age and older. °Monday - Tuesday (8am-5pm) °Most Wednesdays (8:30-5pm) °$30 per visit, cash only  °[facility] ° [address], [place] (336) 641-4533 Patients are seen by appointment only. Walk-ins are not accepted. [facility] will see patients 18 years of age and older. °One   Wednesday Evening (Monthly: Volunteer Based).  $30 per visit, cash only  °[facility]  (919) 537-3737 for adults; Children under age 4, call Graduate Pediatric Dentistry at ([phone]) [phone]. Children aged 4-14, please call (919) [phone] to request a pediatric application. ° Dental services are provided in all areas of dental care including fillings, crowns and bridges, complete and partial dentures, implants, gum treatment, root canals, and extractions. Preventive care is also provided. Treatment is provided to both adults and children. °Patients are selected via a lottery and there is often a waiting list. °  °[facility] [address], °[place] ° (336) 763-8833 [email or web address].[email or web address] °  °[facility] [address], [place], [place] (336)723-1848, Ext. 123 Second and Fourth Thursday of each month, opens at 6:30 AM; Clinic ends at 9 AM.  Patients are seen on a first-come first-served basis, and a limited number are seen during each clinic.  ° °[facility] ° [address], [place], [place] (336) [phone]    Eligibility Requirements °You must have lived in [place], or [place] counties for at least the last three months. °  You cannot be eligible for state or federal sponsored healthcare insurance, including Veterans Administration, Medicaid, or Medicare. °  You generally cannot be eligible for healthcare insurance through your employer.  °  How to apply: °Eligibility screenings are held every Tuesday and Wednesday afternoon from 1:00 pm until 4:00 pm. You do not need an appointment for the interview!  °[facility] [address], [place], [place] 336-631-2330   °[facility]  336-342-8273   °[facility]  336-703-3100   °[facility]  336-570-6415   ° °Behavioral Health Resources in the Community: °Intensive Outpatient Programs °Organization         Address  Phone  Notes  °[facility] [address], [place], [place] 336-878-6098   °[facility] [address], [place], [place] 336-832-9800   °[facility] [address], [place], [place] ° 336-882-2125   °[facility] [address],  °[place], [place] 1-800-853-5163 or 336-641-4981   °Substance Abuse Resources °Organization         Address  Phone  Notes  °[facility]  336-882-2125   °[facility]  336-784-9470   °[facility]  336-285-9073   °[facility]  336-845-3988   °Residential & Outpatient Substance Abuse Program  1-800-659-3381   °Psychological Services °Organization         Address  Phone  Notes  °[facility]  336- 832-9600   °[facility]  336- 378-7881   °[facility] [address], [place] 1-800-853-5163 or 336-641-4981   ° °Mobile Crisis Teams °Organization         Address  Phone  Notes  °[facility], Mobile Crisis Care Unit  1-877-626-1772   °Assertive °[facility] ° [address]  [place], [place] 336-834-9664   °Sharon DeEsch [address] °[place] 336-554-5454   ° °Self-Help/Support Groups °Organization         Address  Phone             Notes  °[facility]  - variety of support groups  336- 373-1402 Call for more information  °Narcotics Anonymous (NA), [facility] [address], °[place]  2 meetings at this location  ° °  Residential Treatment Programs Organization         Address  Phone  Notes  [facility] [address],    [place]  1-610-960-4540   [facility]  [address] [place] [address], [place], [place] [phone]   [facility] [address] [place], [place] 086-578-4696 Admissions: 8am-3pm M-F  [facility] [address]    [place], [place] 295-284-1324   [facility] [address] [place], [place], [place] 401-027-2536   [facility] [address],  [place], [place] 644-034-7425   [facility] Intensive Outpatient [address], [place], [place] 956-387-5643   [facility] ([facility] [address]  [place], [place] 3-295-188-4166 or 646-645-4053   [facility] [address], [place], [place] 323-557-3220 Accepts Medicaid  [facility] [address]  [place] 2-542-706-2376 Substance Abuse/Addiction Treatment   [facility]         Address  Phone  Notes  [facility]  9511223875   Angie Fava, PhD [address] [place] [place], [place]   (515)221-6153 or 778-429-2440   [facility]   [address] [place], [place] (212)116-8082   [facility] [address] [place], [place] (518)194-5550 Insurance/Medicaid/sponsorship through [facility] [address]                                    [place], [place] [phone] Therapy/tele-psych/case    [facility] [address] [place] [address], [place] 269-041-2952    Dr. Lolly Mustache  (939)305-1274   [facility]  [facility] Dept. 1) [address], [place] 2) [address], [place] 3)  [address], [place] [phone]  361-666-9126   [facility] Child Abuse Hotline 8583512398 or 925-531-8247 (After Hours)      Nonspecific Chest Pain It is often hard to find the cause of chest pain. There is always a chance that your pain could be related to something serious, such as a heart attack or a blood clot in your lungs. Chest pain can also be caused by conditions that are not life-threatening. If you have chest pain, it is very important to follow up with your doctor.  HOME CARE  If you were prescribed an antibiotic medicine, finish it all even if you start to feel better.  Avoid any activities that cause chest pain.  Do not use any tobacco products, including cigarettes, chewing tobacco, or electronic cigarettes. If you need help quitting, ask your doctor.  Do not drink alcohol.  Take medicines only as told by your doctor.  Keep all follow-up visits as told by your doctor. This is important. This includes any further testing if your chest pain does not go away.  Your doctor may tell you to keep your head raised (elevated) while you sleep.  Make lifestyle changes as told by your doctor. These may include:  Getting regular exercise. Ask your doctor to suggest some activities that are safe for you.  Eating a heart-healthy diet. Your doctor or a diet specialist (dietitian) can help you to learn healthy eating options.  Maintaining a healthy weight.  Managing diabetes, if necessary.  Reducing stress. GET HELP IF:  Your chest pain does not go away, even after treatment.  You have a rash with blisters on your chest.  You have a fever. GET HELP RIGHT AWAY IF:  Your chest pain is worse.  You  have an increasing cough, or you cough up blood.  You have severe belly (abdominal) pain.  You feel extremely weak.  You pass out (faint).  You have chills.  You have sudden, unexplained chest discomfort.  You have sudden, unexplained discomfort in your arms, back, neck, or jaw.  You have shortness of breath at any time.  You suddenly start to sweat, or your skin gets clammy.  You feel nauseous.  You vomit.  You suddenly feel light-headed or dizzy.  Your heart begins to beat quickly, or it feels like it is skipping beats. These symptoms may be an emergency. Do not wait to see if the symptoms will go away. Get medical help right away. Call your local emergency services (911 in the U.S[place] Do not drive yourself to the hospital.   This information is not intended to replace advice given to you by your health care provider. Make sure you discuss any questions you have with your health care provider.   Document Released: 02/04/2008 Document Revised: 09/08/2014 Document Reviewed: 03/24/2014 Elsevier Interactive Patient Education [employer]

## 2015-06-08 ENCOUNTER — Encounter: Payer: Self-pay | Admitting: Internal Medicine

## 2015-11-16 ENCOUNTER — Ambulatory Visit: Payer: BLUE CROSS/BLUE SHIELD | Admitting: Internal Medicine

## 2016-05-23 ENCOUNTER — Ambulatory Visit: Payer: BLUE CROSS/BLUE SHIELD | Admitting: *Deleted

## 2016-11-06 DIAGNOSIS — F411 Generalized anxiety disorder: Secondary | ICD-10-CM | POA: Diagnosis not present

## 2016-11-06 DIAGNOSIS — F322 Major depressive disorder, single episode, severe without psychotic features: Secondary | ICD-10-CM | POA: Diagnosis not present

## 2016-11-13 DIAGNOSIS — F330 Major depressive disorder, recurrent, mild: Secondary | ICD-10-CM | POA: Diagnosis not present

## 2016-11-17 DIAGNOSIS — Z3200 Encounter for pregnancy test, result unknown: Secondary | ICD-10-CM | POA: Diagnosis not present

## 2016-11-17 DIAGNOSIS — F419 Anxiety disorder, unspecified: Secondary | ICD-10-CM | POA: Diagnosis not present

## 2016-11-19 MED FILL — metFORMIN HCL 500 MG TABS: 500 | 90 days supply | Qty: 180 | Fill #0

## 2016-11-19 MED FILL — CITALOPRAM HBR 10 MG TABLET: 10 | 90 days supply | Qty: 90 | Fill #0

## 2016-12-24 ENCOUNTER — Ambulatory Visit (HOSPITAL_COMMUNITY)
Admission: EM | Admit: 2016-12-24 | Discharge: 2016-12-24 | Disposition: A | Discharge status: Home / Self Care | Payer: 59 | Attending: Family Medicine | Admitting: Family Medicine

## 2016-12-24 ENCOUNTER — Encounter (HOSPITAL_COMMUNITY): Payer: Self-pay | Admitting: Emergency Medicine

## 2016-12-24 ENCOUNTER — Ambulatory Visit (INDEPENDENT_AMBULATORY_CARE_PROVIDER_SITE_OTHER): Payer: 59

## 2016-12-24 DIAGNOSIS — R509 Fever, unspecified: Secondary | ICD-10-CM | POA: Diagnosis not present

## 2016-12-24 DIAGNOSIS — E282 Polycystic ovarian syndrome: Secondary | ICD-10-CM | POA: Insufficient documentation

## 2016-12-24 DIAGNOSIS — Z7984 Long term (current) use of oral hypoglycemic drugs: Secondary | ICD-10-CM | POA: Insufficient documentation

## 2016-12-24 DIAGNOSIS — R05 Cough: Secondary | ICD-10-CM

## 2016-12-24 DIAGNOSIS — Z8719 Personal history of other diseases of the digestive system: Secondary | ICD-10-CM | POA: Insufficient documentation

## 2016-12-24 DIAGNOSIS — R197 Diarrhea, unspecified: Secondary | ICD-10-CM | POA: Insufficient documentation

## 2016-12-24 DIAGNOSIS — Z8249 Family history of ischemic heart disease and other diseases of the circulatory system: Secondary | ICD-10-CM | POA: Diagnosis not present

## 2016-12-24 DIAGNOSIS — R110 Nausea: Secondary | ICD-10-CM | POA: Diagnosis not present

## 2016-12-24 DIAGNOSIS — Z9889 Other specified postprocedural states: Secondary | ICD-10-CM | POA: Diagnosis not present

## 2016-12-24 DIAGNOSIS — Z79899 Other long term (current) drug therapy: Secondary | ICD-10-CM | POA: Insufficient documentation

## 2016-12-24 DIAGNOSIS — Z888 Allergy status to other drugs, medicaments and biological substances status: Secondary | ICD-10-CM | POA: Diagnosis not present

## 2016-12-24 DIAGNOSIS — Z8379 Family history of other diseases of the digestive system: Secondary | ICD-10-CM | POA: Diagnosis not present

## 2016-12-24 DIAGNOSIS — F329 Major depressive disorder, single episode, unspecified: Secondary | ICD-10-CM | POA: Diagnosis not present

## 2016-12-24 DIAGNOSIS — J029 Acute pharyngitis, unspecified: Secondary | ICD-10-CM | POA: Diagnosis not present

## 2016-12-24 DIAGNOSIS — Z8261 Family history of arthritis: Secondary | ICD-10-CM | POA: Diagnosis not present

## 2016-12-24 DIAGNOSIS — F429 Obsessive-compulsive disorder, unspecified: Secondary | ICD-10-CM | POA: Insufficient documentation

## 2016-12-24 DIAGNOSIS — J069 Acute upper respiratory infection, unspecified: Secondary | ICD-10-CM | POA: Insufficient documentation

## 2016-12-24 HISTORY — DX: Obsessive-compulsive disorder, unspecified: F42.9

## 2016-12-24 LAB — POCT RAPID STREP A: Streptococcus, Group A Screen (Direct): NEGATIVE

## 2016-12-24 MED ORDER — HYDROCODONE-HOMATROPINE 5-1.5 MG/5ML PO SYRP: 5.0000 mL | ORAL_SOLUTION | Freq: Four times a day (QID) | ORAL | 0 refills | Status: DC | PRN

## 2016-12-24 MED FILL — HYDROCODONE-HOMATROPINE SYR: 5-1.5 | 6 days supply | Qty: 120 | Fill #0

## 2016-12-24 NOTE — Discharge Instructions (Signed)
The chest x-ray is most consistent with bronchitis, as the bronchial tubes are swollen.  The strep test is negative.  I believe the etiology of your symptoms is an adenovirus.  These need to be treated symptomatically with rest, fluids and fever reducers (tylenol or ibuprofen).  In addition, I will prescribe some cough medicine to make you more comfortable.

## 2016-12-24 NOTE — ED Provider Notes (Signed)
[facility]    CSN: [id number] Arrival date & time: 12/24/16  1505     History   Chief Complaint Chief Complaint  Patient presents with  . Cough    HPI Cassandra Ibarra is a 26 y.o. female.   The patient presented to the [facility] with a complaint of flu like symptoms to include cough, fatigue and body aches x 2 days. She says that it feels like razor blades in the back of her throat. She's had some nausea and diarrhea as well.  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      Past Medical History:  Diagnosis Date  . Depression   . 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 Gastric ulcer   . OCD (obsessive compulsive disorder)   . PCOS (polycystic ovarian syndrome)   . PONV (postoperative nausea and vomiting)     Patient Active Problem List   Diagnosis Date Noted  . PCOS (polycystic ovarian syndrome) 05/18/2015    Past Surgical History:  Procedure Laterality Date  . ESOPHAGOGASTRODUODENOSCOPY (EGD) WITH PROPOFOL N/A 02/09/2014   Procedure: ESOPHAGOGASTRODUODENOSCOPY (EGD) WITH PROPOFOL;  Surgeon: Theda Belfast, MD;  Location[facility] END[facility];  Service: Endoscopy;  Laterality: N/A;  . WISDOM TOOTH EXTRACTION Left 09/2013    OB History    No data available       Home Medications    Prior to Admission medications   Medication Sig Start Date End Date Taking? Authorizing Provider  citalopram (CELEXA) 10 MG tablet Take 10 mg by mouth daily.   Yes Historical Provider, MD  metFORMIN (GLUCOPHAGE-XR) 500 MG 24 hr tablet Take 2 tablets (1,000 mg total) by mouth 2 (two) times daily with a meal. Patient taking differently: Take 500 mg by mouth 2 (two) times daily with a meal.  05/18/15  Yes Carlus Pavlov, MD  HYDROcodone-homatropine (HYDROMET) 5-1.5 MG/5ML syrup Take 5 mLs by mouth every 6 (six) hours as needed for cough. 12/24/16   Elvina Sidle, MD    Family History Family History  Problem Relation Age of Onset  .  Arthritis Mother   . Heart disease Father   . Hyperlipidemia Father   . Crohn's disease Neg Hx     Social History Social History  Substance Use Topics  . Smoking status: Never Smoker  . Smokeless tobacco: Never Used  . Alcohol use Yes     Allergies   Ocella [drospirenone-ethinyl estradiol]; Adhesive [tape]; Lo loestrin fe [norethin-eth estrad-fe biphas]; and Lorazepam   Review of Systems Review of Systems  Constitutional: Positive for activity change, diaphoresis, fatigue and fever.  HENT: Positive for congestion and sore throat.   Respiratory: Positive for cough.   Gastrointestinal: Positive for diarrhea and nausea.  Genitourinary: Negative.   Musculoskeletal: Positive for myalgias.  Neurological: Negative.      Physical Exam Triage Vital Signs ED Triage Vitals  Enc Vitals Group     BP 12/24/16 1518 133/83     Pulse Rate 12/24/16 1518 99     Resp 12/24/16 1518 20     Temp 12/24/16 1518 98.2 F (36.8 C)     Temp Source 12/24/16 1518 Oral     SpO2 12/24/16 1518 97 %     Weight --      Height --      Head Circumference --      Peak Flow --      Pain Score 12/24/16 1516 5     Pain  Loc --      Pain Edu? --      Excl. in GC? --    No data found.   Updated Vital Signs BP 133/83 (BP Location: Right Wrist)   Pulse 99   Temp 98.2 F (36.8 C) (Oral)   Resp 20   LMP 12/09/2016 (Exact Date)   SpO2 97%    Physical Exam  Constitutional: She is oriented to person, place, and time. She appears well-developed and well-nourished.  Morbidly obese  HENT:  Right Ear: External ear normal.  Left Ear: External ear normal.  Reddened posterior pharynx  Eyes: Conjunctivae and EOM are normal. Pupils are equal, round, and reactive to light.  Neck: Normal range of motion. Neck supple.  Pulmonary/Chest: Effort normal. She has rales.  Musculoskeletal: Normal range of motion.  Lymphadenopathy:    She has no cervical adenopathy.  Neurological: She is alert and oriented to  person, place, and time.  Skin: Skin is warm and dry.  Nursing note and vitals reviewed.    UC Treatments / Results  Labs (all labs ordered are listed, but only abnormal results are displayed) Labs Reviewed  POCT RAPID STREP A    EKG  EKG Interpretation None       Radiology Dg Chest 2 View  Result Date: 12/24/2016 CLINICAL DATA:  Initial evaluation for acute productive cough, fever. EXAM: CHEST  2 VIEW COMPARISON:  Prior CT and radiograph from 06/06/2015. FINDINGS: Cardiac and mediastinal silhouettes are stable in size and contour, and remain within normal limits. Lungs are hypoinflated. Mild central peribronchial thickening, which may reflect sequelae of acute bronchiolitis given provided history. No focal infiltrates to suggest pneumonia. No pulmonary edema or pleural effusion. No pneumothorax. No acute osseus abnormality. IMPRESSION: 1. Mild central peribronchial thickening, which may reflect acute bronchiolitis given provided history. No radiographic evidence for alveolar pneumonia. 2. Shallow lung inflation. Electronically Signed   By: Rise Mu M.D.   On: 12/24/2016 15:47    Procedures Procedures (including critical care time)  Medications Ordered in UC Medications - No data to display   Initial Impression / Assessment and Plan / UC Course  I have reviewed the triage vital signs and the nursing notes.  Pertinent labs & imaging results that were available during my care of the patient were reviewed by me and considered in my medical decision making (see chart for details).     Final Clinical Impressions(s) / UC Diagnoses   Final diagnoses:  Acute upper respiratory infection    New Prescriptions New Prescriptions   HYDROCODONE-HOMATROPINE (HYDROMET) 5-1.5 MG/5ML SYRUP    Take 5 mLs by mouth every 6 (six) hours as needed for cough.     Elvina Sidle, MD 12/24/16 [phone]

## 2016-12-24 NOTE — ED Triage Notes (Signed)
The patient presented to the [facility] with a complaint of flu like symptoms to include cough, fatigue and body aches x 2 days.

## 2016-12-27 LAB — CULTURE, GROUP A STREP (THRC)

## 2017-02-12 IMAGING — CR DG LUMBAR SPINE COMPLETE 4+V
6 series · 6 of 6 positions shown · non-contrast
Comparison: None

CLINICAL DATA: Chronic back pain, worsening the past 2 weeks.

EXAM:
LUMBAR SPINE - COMPLETE 4+ VIEW

[rpo (1 of 2)]
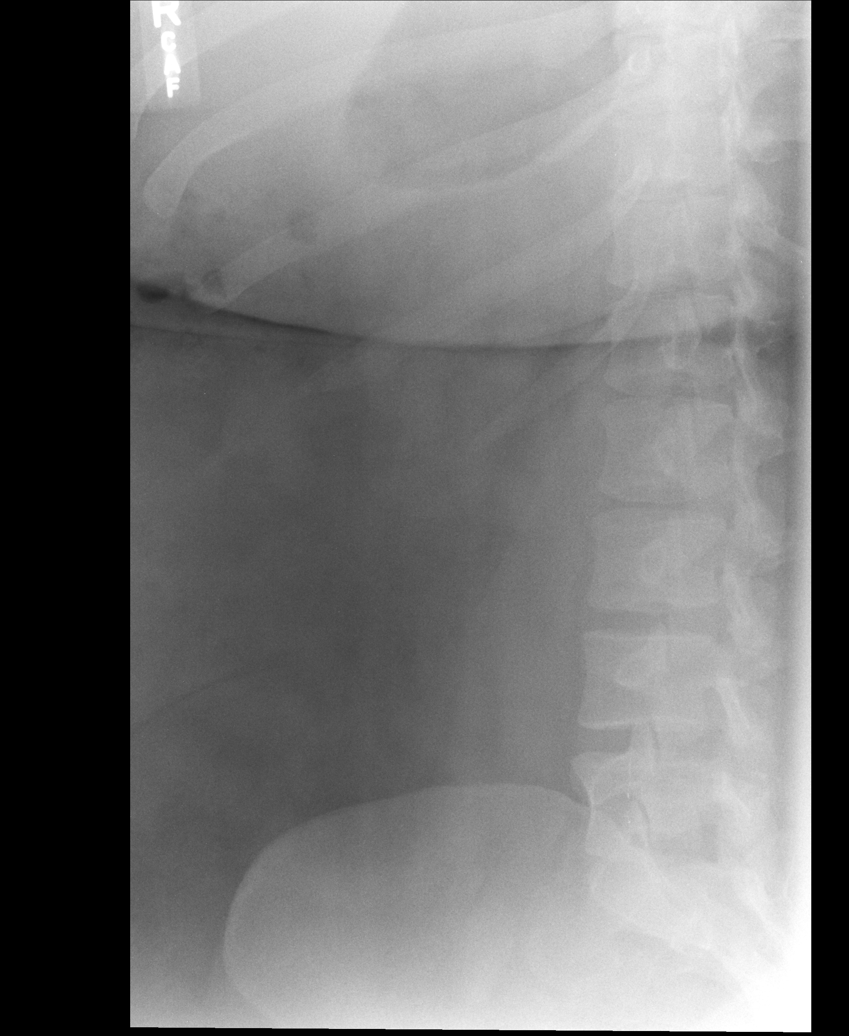

[AP (1 of 2)]
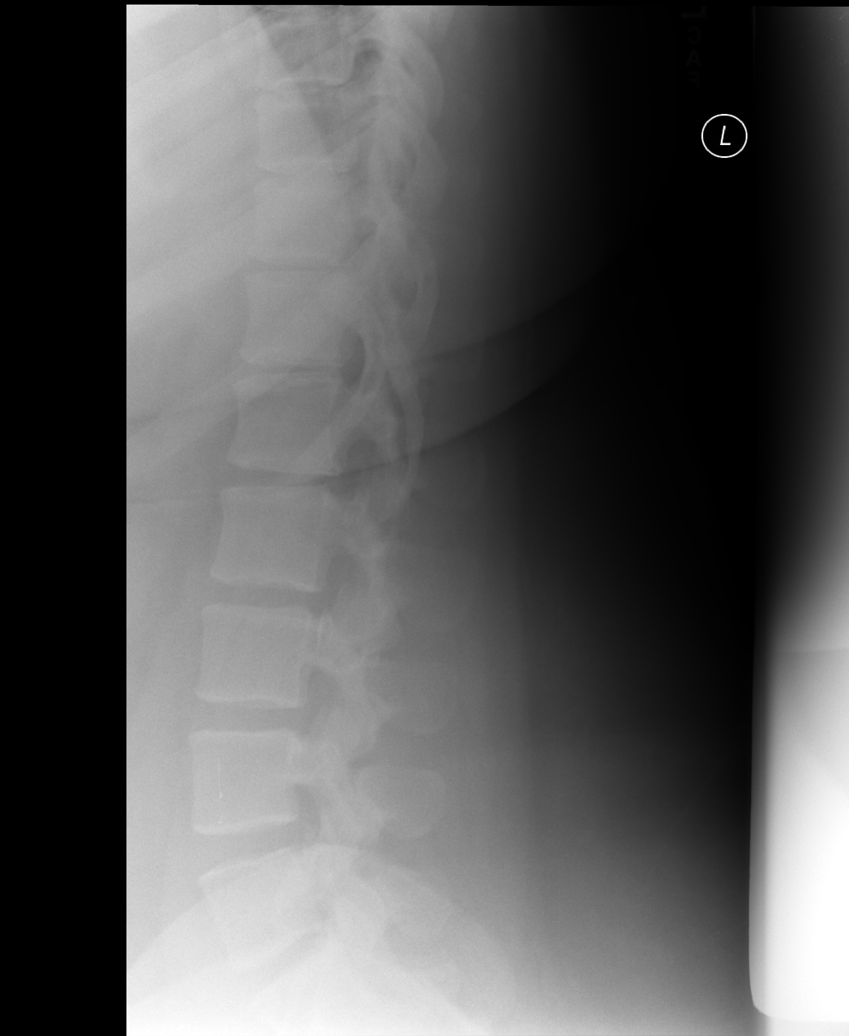

[l5 s1]
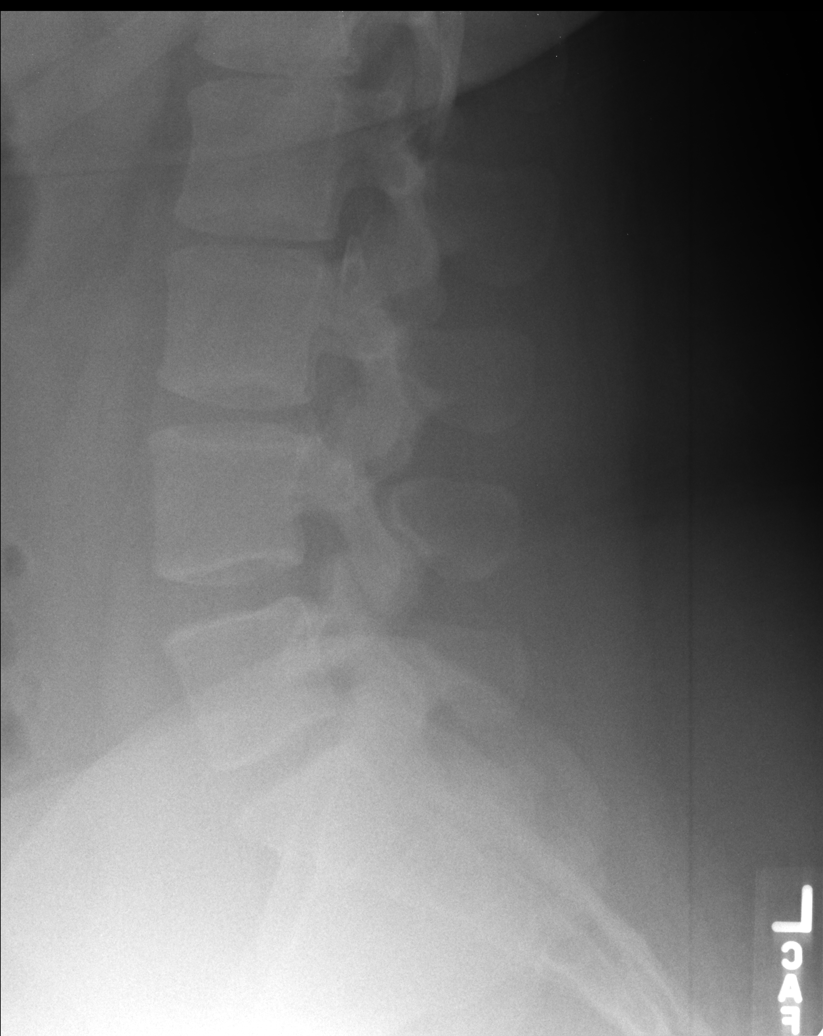

[AP (2 of 2)]
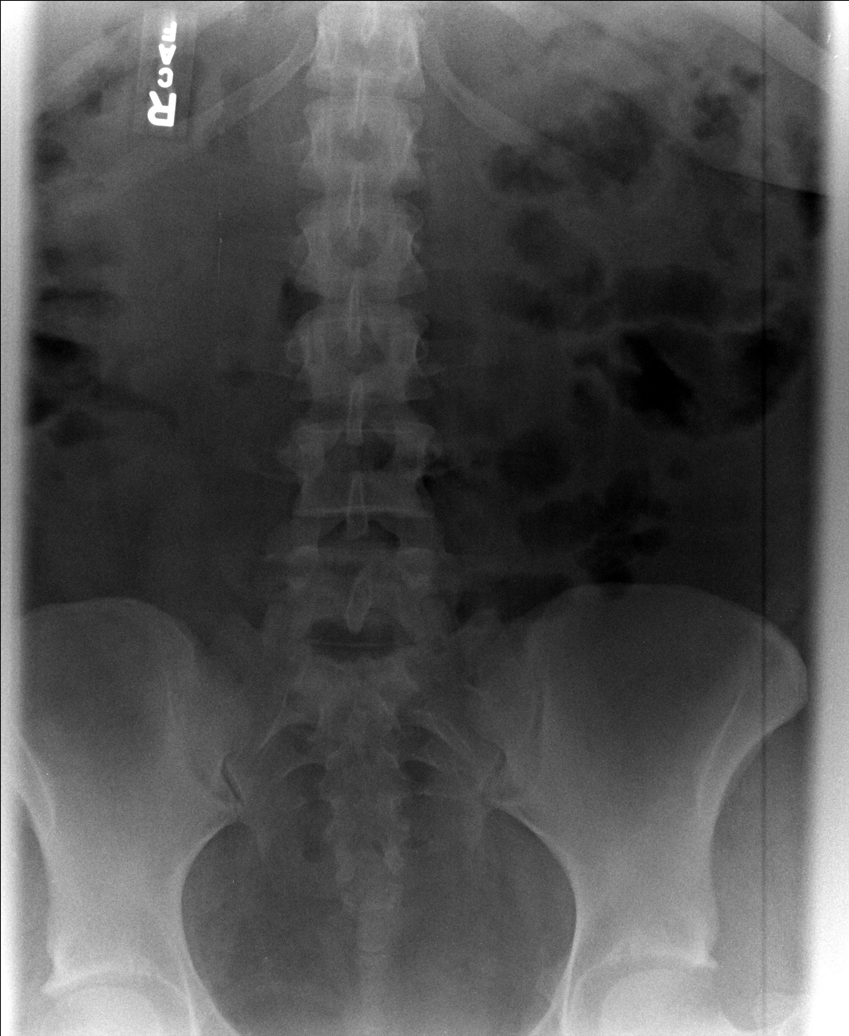

[lpo]
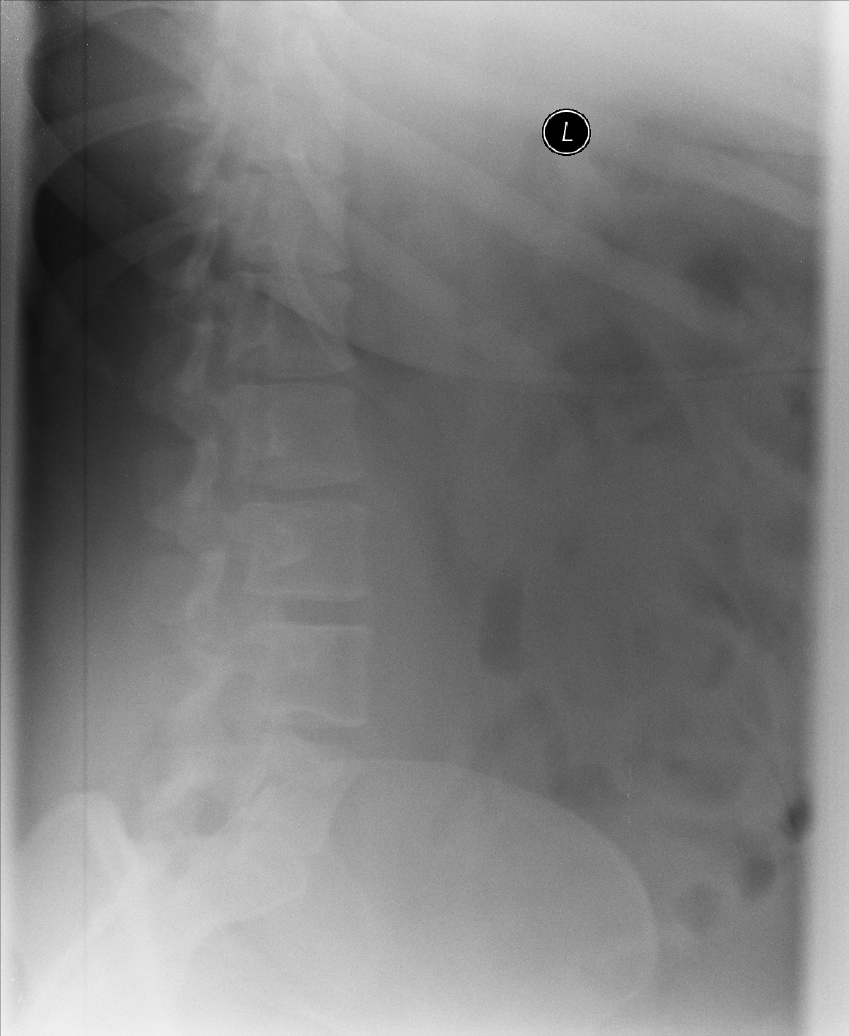

[rpo (2 of 2)]
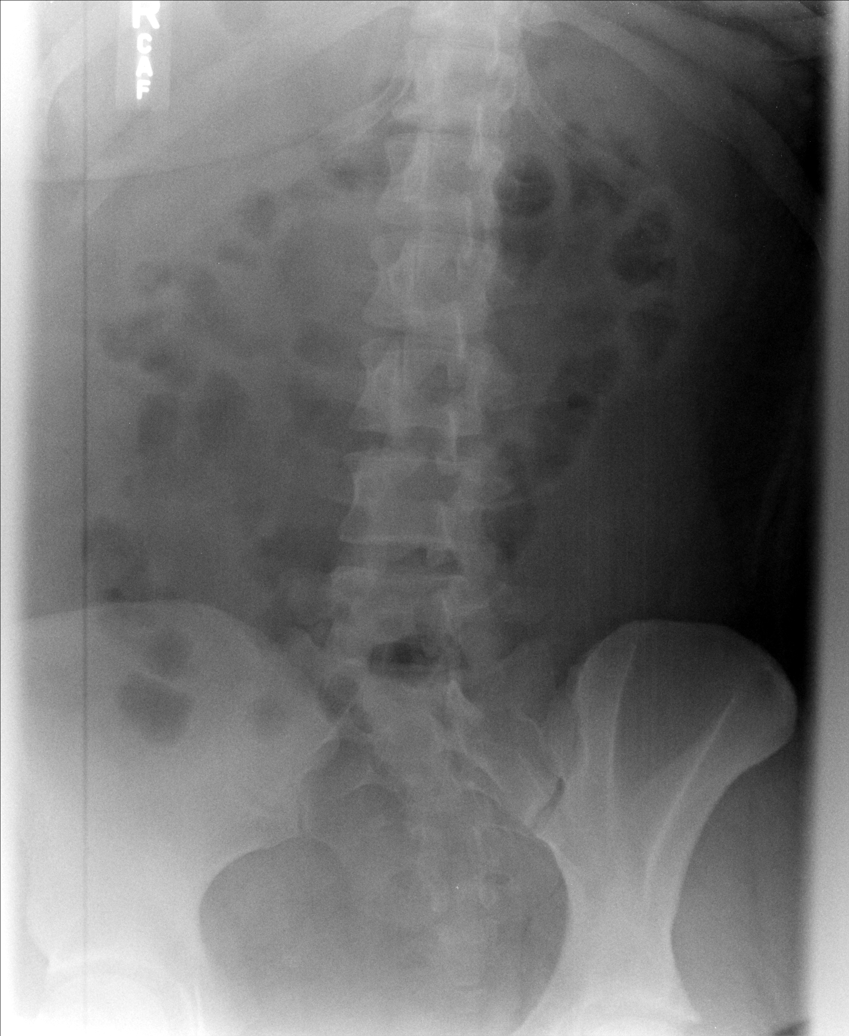

[6 of 6 positions shown; findings below may reference images not displayed]

FINDINGS: There is no evidence of lumbar spine fracture. Alignment is normal.
Intervertebral disc spaces are maintained.
IMPRESSION: Negative.

## 2017-02-12 IMAGING — CR DG THORACIC SPINE 2V
3 series · 3 of 3 positions shown · non-contrast
Comparison: Lateral chest radiograph, 06/10/2014

CLINICAL DATA: Chronic back pain worsening over the last 2 weeks.

EXAM:
THORACIC SPINE 2 VIEWS

[AP]
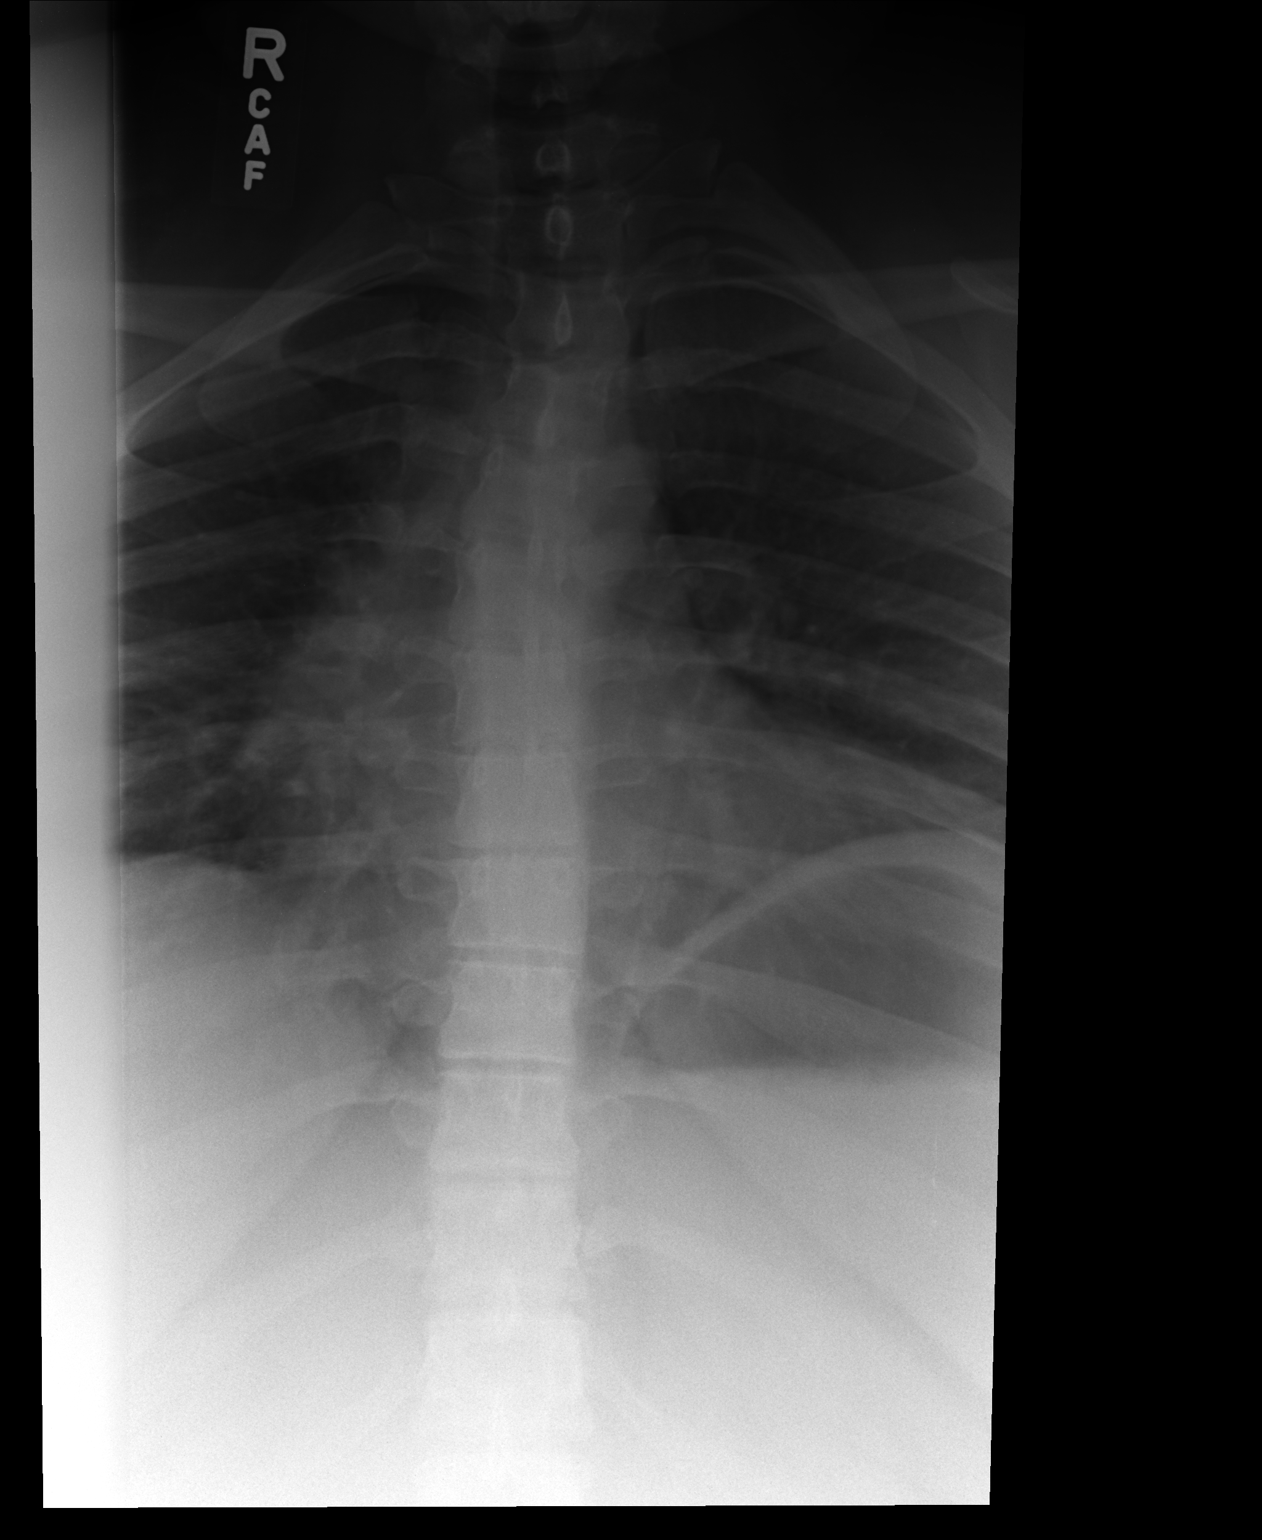

[lateral]
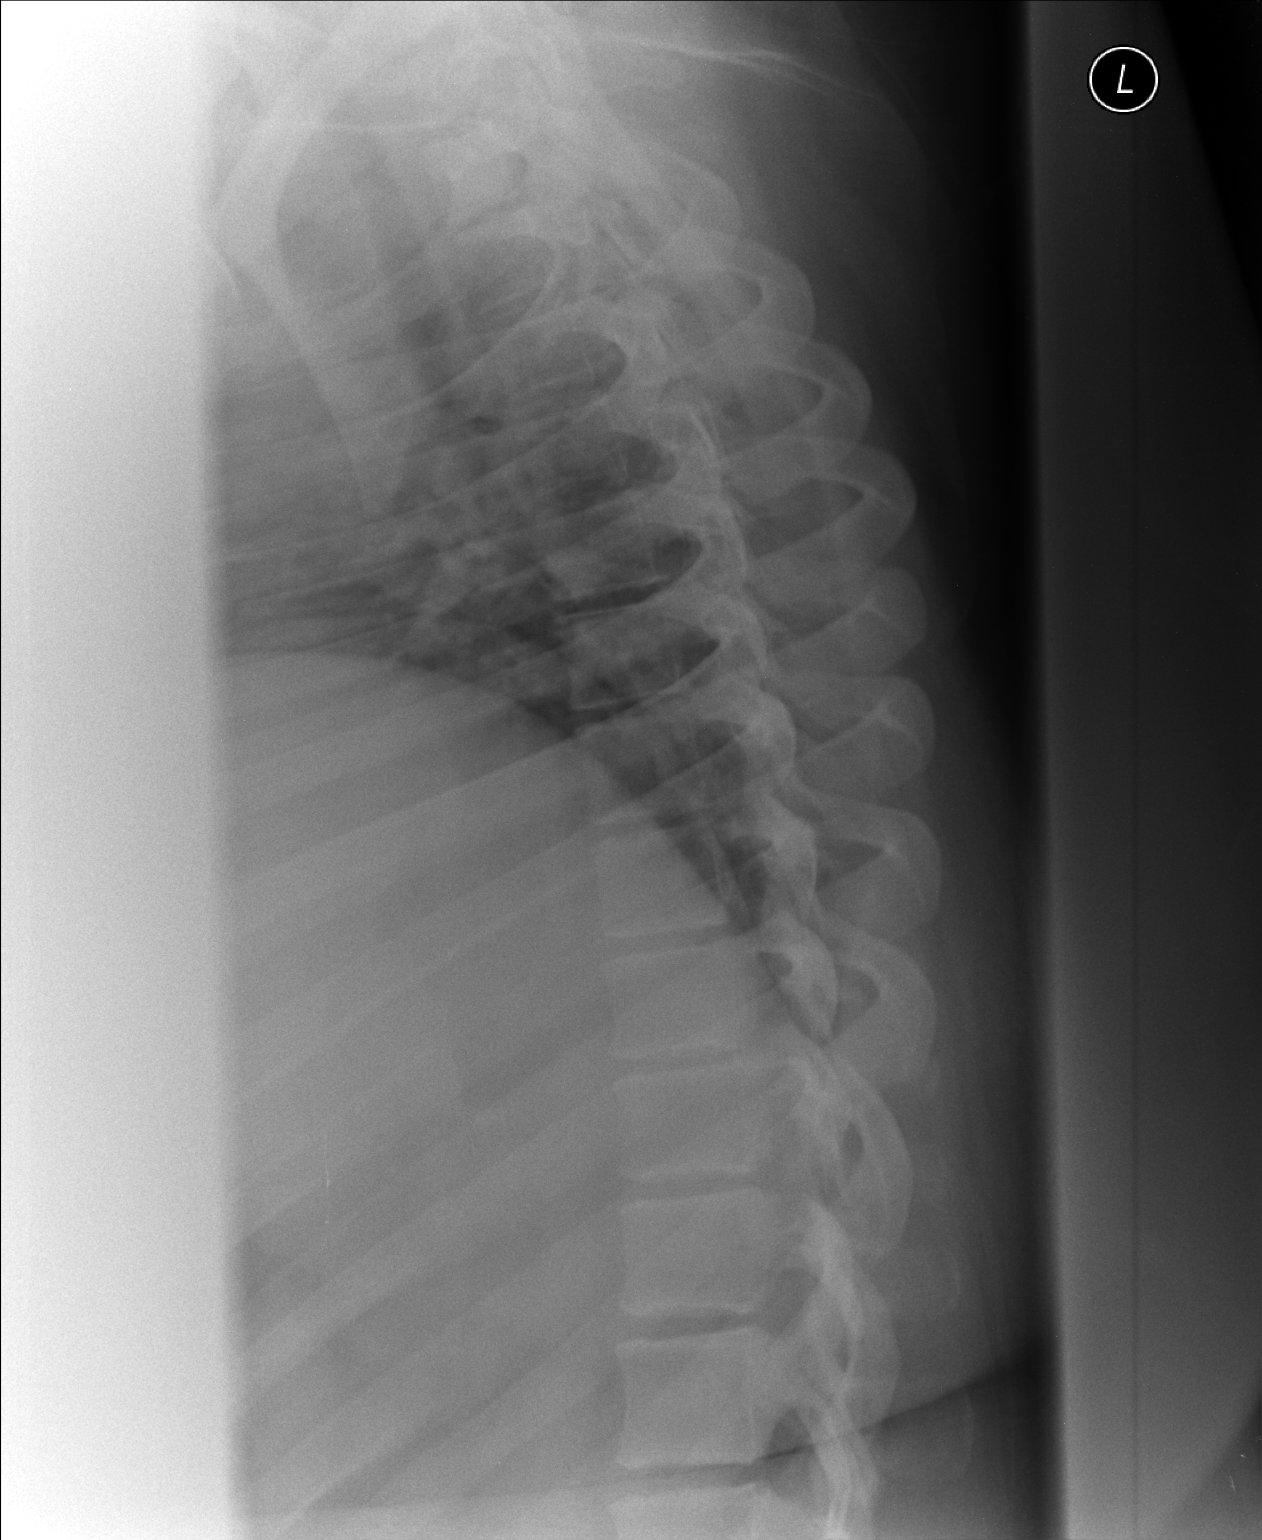

[swimmers]
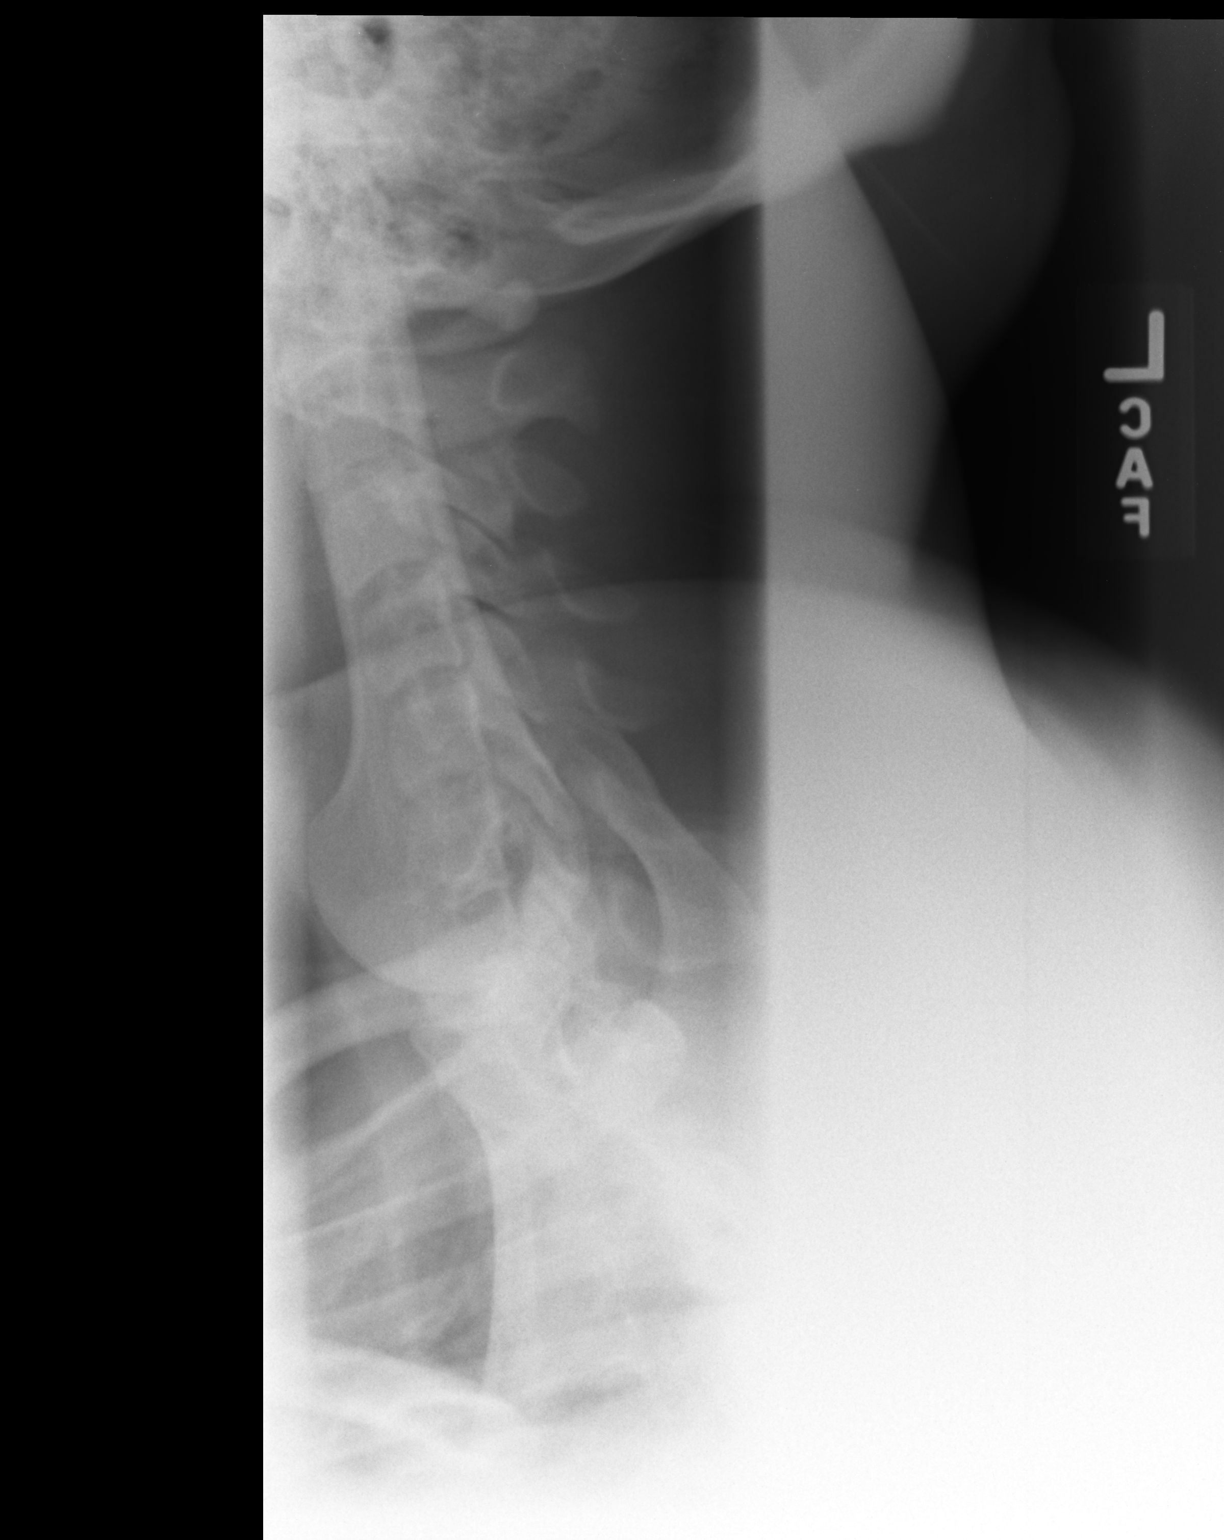

[3 of 3 positions shown; findings below may reference images not displayed]

FINDINGS: No fracture. No spondylolisthesis. No bone lesion. There are no
degenerative changes.

Mild dextroscoliosis noted in the mid to upper thoracic spine, apex
at T6, measuring 15 degrees in severity.

Normal soft tissues.
IMPRESSION: No fracture or acute finding.  Mild dextroscoliosis.

## 2017-02-23 MED FILL — CITALOPRAM HBR 10 MG TABLET: 10 | 90 days supply | Qty: 90 | Fill #1

## 2017-03-18 NOTE — Progress Notes (Signed)
Cassandra Ibarra was seen in the neurology clinic today at the request of Conley Rolls, [place], [place].  She is accompanied by her mother who supplements the history.  Her husband is on facetime in the room at her request.  The consultation is for the evaluation of headache and blurred disc margins on eye examination.  The records that were made available to me were reviewed.  Patient was seen by her optometrist on 03/17/2017 for her routine examination.  Her ocular pressures were normal.  Dilated fundus exam revealed slight blurred disc margins (it does not state if this is unilateral or bilateral).  I do not have her complete examination.  I do not believe that visual field testing was performed.  While her evaluation does state that she has slight blurred disc margins, it also stated that the patient had headaches with "normal ocular health."  Pt states that she has noted eye watering with some short, temporary blurriness and she has thought that this was allergies.  The patient does admit to headaches.  The patient reports that headache began about 3 weeks ago.  The location of the headache is in the bilateral temporal or around the eyes.  The headaches may be around the eyes as well. The headache is described as a pressure.  It will last hours.  She treats with excedrin migraine and it may or may not help. There is photophobia with fluroescent light but not with sunlight.  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  The patient reports that there is rarely associated nausea and no emesis.  The patient reports no rhinorrhea or conjunctival erythema.    She is not taking any vitamins or OTC supplements (rarely will take vit D but not regularly).  She is not on estrogen containing contraceptives.  She got off of those in Feb.  She is not using anything for contraception but states that she is not trying to get pregnant.  States  that she has PCOD.   Neuroimaging has not previously been performed.   PREVIOUS MEDICATIONS: n/a  ALLERGIES:   Allergies  Allergen Reactions  . Ocella [Drospirenone-Ethinyl Estradiol] Shortness Of Breath  . Adhesive [Tape] Itching  . Lo Loestrin Fe [Norethin-Eth Estrad-Fe Biphas] Palpitations  . Lorazepam Other (See Comments)    "makes me angry"    CURRENT MEDICATIONS:  Current Outpatient Prescriptions on File Prior to Visit  Medication Sig Dispense Refill  . citalopram (CELEXA) 10 MG tablet Take 10 mg by mouth daily.     No current facility-administered medications on file prior to visit.     PAST MEDICAL HISTORY:   Past Medical History:  Diagnosis Date  . Depression   . 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 Gastric ulcer   . OCD (obsessive compulsive disorder)   . PCOS (polycystic ovarian syndrome)   . PONV (postoperative nausea and vomiting)     PAST SURGICAL HISTORY:   Past Surgical History:  Procedure Laterality Date  . ESOPHAGOGASTRODUODENOSCOPY (EGD) WITH PROPOFOL N/A 02/09/2014   Procedure: ESOPHAGOGASTRODUODENOSCOPY (EGD) WITH PROPOFOL;  Surgeon: Theda Belfast, MD;  Location: [facility] ENDOSCOPY;  Service: Endoscopy;  Laterality: N/A;  . WISDOM TOOTH EXTRACTION Left 09/2013    SOCIAL HISTORY:   Social History   Social History  . Marital status: Married    Spouse name: N/A  . Number of children:  N/A  . Years of education: N/A   Occupational History  .      medicare wellness visits   Social History Main Topics  . Smoking status: Never Smoker  . Smokeless tobacco: Never Used  . Alcohol use Yes     Comment: couple times a month  . Drug use: No  . Sexual activity: Yes    Partners: Male   Other Topics Concern  . Not on file   Social History Narrative  . No narrative on file    FAMILY HISTORY:   Family Status  Relation Status  . Mother Alive  . Father Alive  . Sister Alive  . Neg Hx (Not Specified)    ROS:  IA complete 10 system review  of systems was obtained and was unremarkable apart from what is mentioned above.  PHYSICAL EXAMINATION:    VITALS:   Vitals:   03/20/17 0937  BP: 112/76  Pulse: (!) 112  SpO2: 95%  Weight: (!) 414 lb (187.8 kg)  Height: 5' 6.5" (1.689 m)    GEN:  Appears stated age and in NAD. HEENT:  Normocephalic, atraumatic. The mucous membranes are moist. The superficial temporal arteries are without ropiness or tenderness. Cardiovascular: Tachycardic with regular rhythm. Lungs: Clear to auscultation bilaterally. Neck: There are no carotid bruits noted bilaterally.  NEUROLOGICAL: Orientation:  Pt is alert and oriented x 3.  Fund of knowledge is appropriate.  Recent and remote memory intact.  Attention and concentration normal.  Able to repeat and name. Cranial nerves: There is good facial symmetry. The pupils are equal round and reactive to light bilaterally. Funduscopic exam is attempted but the disc margins are not well visualized.  The vessels look good. Extraocular muscles are intact and visual fields are full to confrontational testing. Speech is fluent and clear. Soft palate rises symmetrically and there is no tongue deviation. Hearing is intact to conversational tone. Tone: Tone is good throughout. Sensation: Sensation is intact to light touch and pinprick throughout (facial, extremities, trunk). Vibration is intact at the bilateral big toe. There is no extinction with double simultaneous stimulation. There is no sensory dermatomal level identified. Coordination:  The patient has no difficulty with RAM's or FNF bilaterally. Motor: Strength is 5/5 in the bilateral upper and lower extremities. Shoulder shrug is equal and symmetric.  There is no pronator drift.  There are no fasciculations noted. DTR's: Deep tendon reflexes are 2+/4 at the bilateral biceps, triceps, brachioradialis, patella and achilles.  Plantar responses are downgoing bilaterally. Gait and Station: The patient is able to  ambulate without difficulty. The patient is able to heel toe walk without any difficulty. The patient is able to ambulate in a tandem fashion. The patient is able to stand in the Romberg position.   IMPRESSIONS/PLAN:  1.  Headache.    -Her headaches do not necessarily sound like those characteristic of pseudotumor cerebri.  They just started a few weeks ago.  I do not think she had papilledema on her ophthalmologic examination as it reported "normal ocular health" although there was "slight blurred disc margins."  -We will do an MRI of the brain.  -She and I discussed the value of lumbar puncture.  We discussed the risks and benefits of this procedure.  We decided to proceed.  She will avoid aspirin or aspirin-containing product 5 days prior to lumbar puncture.  If the lumbar puncture is negative, we will consider topiramate for the treatment of headache.  We may consider this even if  the lumbar puncture is positive, depending on how high the pressure is, as it could be of value for weight loss/control.  She would, however, need to be on some type of birth control, even if it was condoms.  2.  Further recommendations will follow the above testing.  Greater than 50% of the 60 minute visit was spent in counseling and coordinating care.   CC:  System, Pcp Not In

## 2017-03-20 ENCOUNTER — Ambulatory Visit (INDEPENDENT_AMBULATORY_CARE_PROVIDER_SITE_OTHER): Payer: 59 | Admitting: Neurology

## 2017-03-20 ENCOUNTER — Encounter: Payer: Self-pay | Admitting: Neurology

## 2017-03-20 VITALS — BP 112/76 | HR 112 | Ht 66.5 in | Wt >= 6400 oz

## 2017-03-20 DIAGNOSIS — R938 Abnormal findings on diagnostic imaging of other specified body structures: Secondary | ICD-10-CM

## 2017-03-20 DIAGNOSIS — G4452 New daily persistent headache (NDPH): Secondary | ICD-10-CM

## 2017-03-20 DIAGNOSIS — H579 Unspecified disorder of eye and adnexa: Secondary | ICD-10-CM

## 2017-03-20 NOTE — Patient Instructions (Signed)
1. We have scheduled you at [facility] for your MRI on 03/30/17 at 2:00 pm. Please arrive 15 minutes prior and go to 1st floor radiology. If you need to reschedule for any reason please call [phone].  2. We have scheduled you at [facility] for your Lumbar Puncture on 04/15/17 at 9:00 am. Please arrive 15 minutes prior and go to 1st floor radiology. If you need to reschedule for any reason please call [phone]. **DO NOT TAKE ANY NSAIDS/ASPIRINS FIVE DAYS PRIOR TO PROCEDURE**

## 2017-03-24 ENCOUNTER — Telehealth: Payer: Self-pay | Admitting: Neurology

## 2017-03-24 ENCOUNTER — Encounter: Payer: Self-pay | Admitting: Neurology

## 2017-03-24 NOTE — Telephone Encounter (Signed)
Caller: Shalinda  Urgent? No  Reason for the call: She is needing to switch where she is having her MRI. The NPI # is [id number] and the fax #'s are [phone] and [phone]. She asked could that be faxed with the new Authorization form? Please Advise. Thanks

## 2017-03-24 NOTE — Telephone Encounter (Signed)
[facility] called regarding needing an order and Auth sent to them? Thanks

## 2017-03-24 NOTE — Telephone Encounter (Signed)
Cassandra Ibarra you received this call earlier, do you know what happened?

## 2017-03-25 NOTE — Telephone Encounter (Signed)
Patient calling back to see where we are in the process of the new Authorization for her to have her MRI? Please Advise. Thanks

## 2017-03-25 NOTE — Telephone Encounter (Signed)
Order faxed to both numbers with confirmation received. Patient made aware.

## 2017-03-25 NOTE — Telephone Encounter (Signed)
She is calling to say they only need an order faxed over not prior Auth. The fax #'s are [phone] and [phone]. Thanks

## 2017-03-26 DIAGNOSIS — R51 Headache: Secondary | ICD-10-CM | POA: Diagnosis not present

## 2017-03-26 DIAGNOSIS — H4710 Unspecified papilledema: Secondary | ICD-10-CM | POA: Diagnosis not present

## 2017-03-26 DIAGNOSIS — G43911 Migraine, unspecified, intractable, with status migrainosus: Secondary | ICD-10-CM | POA: Diagnosis not present

## 2017-03-27 ENCOUNTER — Telehealth: Payer: Self-pay | Admitting: Neurology

## 2017-03-27 NOTE — Telephone Encounter (Signed)
Patient would like the results of the MRI she had done

## 2017-03-27 NOTE — Telephone Encounter (Signed)
Will call patient when we receive results.

## 2017-03-30 ENCOUNTER — Ambulatory Visit (HOSPITAL_COMMUNITY): Payer: 59

## 2017-03-30 ENCOUNTER — Telehealth: Payer: Self-pay | Admitting: Neurology

## 2017-03-30 NOTE — Telephone Encounter (Signed)
PT called and wants to know if her MRI results were available yet

## 2017-03-31 NOTE — Telephone Encounter (Signed)
Patient made aware MR normal and that Dr. Arbutus Leasat would give further recommendations after lumbar puncture is completed.

## 2017-03-31 NOTE — Telephone Encounter (Signed)
Left message on machine for patient to call back.   Called and received report from [facility]. MR normal. Awaiting call back to make patient aware.

## 2017-03-31 NOTE — Telephone Encounter (Signed)
Patient returned your call.  Thanks!

## 2017-04-06 ENCOUNTER — Telehealth: Payer: Self-pay | Admitting: Neurology

## 2017-04-06 NOTE — Telephone Encounter (Signed)
Received MRI report dated 03/26/17 that MRI brain without gad was normal.  Done at [facility].  No films accompany this report.  Has LP scheduled on 8/15.

## 2017-04-09 ENCOUNTER — Telehealth: Payer: Self-pay

## 2017-04-09 NOTE — Telephone Encounter (Signed)
Spoke with [facility] radiology who wanted to make sure patient had an MRI before her upcoming lumbar puncture, as nothing was showing in epic.  [facility] at [facility].  Spoke to [place] at Radiology and relayed this information.  She acknowledged and understood.

## 2017-04-14 ENCOUNTER — Other Ambulatory Visit: Payer: Self-pay | Admitting: Radiology

## 2017-04-15 ENCOUNTER — Telehealth: Payer: Self-pay | Admitting: Neurology

## 2017-04-15 ENCOUNTER — Ambulatory Visit (HOSPITAL_COMMUNITY): Payer: 59

## 2017-04-15 ENCOUNTER — Ambulatory Visit (HOSPITAL_COMMUNITY)
Admission: RE | Admit: 2017-04-15 | Discharge: 2017-04-15 | Disposition: A | Discharge status: Home / Self Care | Payer: 59 | Source: Ambulatory Visit | Attending: Neurology | Admitting: Neurology

## 2017-04-15 DIAGNOSIS — G4452 New daily persistent headache (NDPH): Secondary | ICD-10-CM | POA: Diagnosis not present

## 2017-04-15 DIAGNOSIS — R839 Unspecified abnormal finding in cerebrospinal fluid: Secondary | ICD-10-CM | POA: Diagnosis not present

## 2017-04-15 DIAGNOSIS — R51 Headache: Secondary | ICD-10-CM | POA: Diagnosis not present

## 2017-04-15 LAB — CSF CELL COUNT WITH DIFFERENTIAL
RBC Count, CSF: 13 /mm3 — ABNORMAL HIGH
Tube #: 3
WBC CSF: 0 /mm3 (ref 0–5)

## 2017-04-15 LAB — PROTEIN, CSF: Total  Protein, CSF: 17 mg/dL (ref 15–45)

## 2017-04-15 MED ORDER — OXYCODONE HCL ER 10 MG PO T12A: 10.0000 mg | EXTENDED_RELEASE_TABLET | Freq: Once | ORAL | Status: DC

## 2017-04-15 MED ORDER — ACETAMINOPHEN 325 MG PO TABS
650.0000 mg | ORAL_TABLET | Freq: Once | ORAL | Status: AC
  Administered 2017-04-15: 650 mg via ORAL
  Filled 2017-04-15: qty 2

## 2017-04-15 MED ORDER — LIDOCAINE HCL (PF) 1 % IJ SOLN
5.0000 mL | Freq: Once | INTRAMUSCULAR | Status: AC
  Administered 2017-04-15: 5 mL via INTRADERMAL

## 2017-04-15 MED ORDER — OXYCODONE HCL 5 MG PO TABS
5.0000 mg | ORAL_TABLET | Freq: Once | ORAL | Status: AC
  Administered 2017-04-15: 5 mg via ORAL
  Filled 2017-04-15: qty 1

## 2017-04-15 MED ORDER — ACETAMINOPHEN 325 MG PO TABS
ORAL_TABLET | ORAL | Status: AC
  Filled 2017-04-15: qty 2

## 2017-04-15 MED ORDER — OXYCODONE HCL 5 MG PO TABS
ORAL_TABLET | ORAL | Status: AC
  Filled 2017-04-15: qty 1

## 2017-04-15 NOTE — Telephone Encounter (Signed)
-----   Message from Octaviano Battyebecca S Tat, DO sent at 04/15/2017 10:26 AM EDT ----- Let pt know that unfortunately I ordered the LP to get an opening pressure which was not done.  They did get some fluid.  Did taking off that fluid help her headaches?

## 2017-04-15 NOTE — Discharge Instructions (Signed)
Lumbar Puncture, Care After °Refer to this sheet in the next few weeks. These instructions provide you with information on caring for yourself after your procedure. Your health care provider may also give you more specific instructions. Your treatment has been planned according to current medical practices, but problems sometimes occur. Call your health care provider if you have any problems or questions after your procedure. °What can I expect after the procedure? °After your procedure, it is typical to have the following sensations: °· Mild discomfort or pain at the insertion site. °· Mild headache that is relieved with pain medicines. ° °Follow these instructions at home: ° °· Avoid lifting anything heavier than 10 lb (4.5 kg) for at least 12 hours after the procedure. °· Drink enough fluids to keep your urine clear or pale yellow. °Contact a health care provider if: °· You have fever or chills. °· You have nausea or vomiting. °· You have a headache that lasts for more than 2 days. °Get help right away if: °· You have any numbness or tingling in your legs. °· You are unable to control your bowel or bladder. °· You have bleeding or swelling in your back at the insertion site. °· You are dizzy or faint. °This information is not intended to replace advice given to you by your health care provider. Make sure you discuss any questions you have with your health care provider. °Document Released: 08/23/2013 Document Revised: 01/24/2016 Document Reviewed: 04/26/2013 °Elsevier Interactive Patient Education © 2017 Elsevier Inc. ° °

## 2017-04-15 NOTE — Progress Notes (Signed)
Post LP, no immediate complications. Pt c/o back pain near site while in recovery. Pt reports feels like a 'back strain' Came to bedside, no obvious distress. RN had already applied cold compress.  Site eval, no ecchymosis or hematoma. No fluid leakage from skin site. Mildly tender.  Advised alternating cold/hot compresses. Expect resolution.  Brayton ElKevin Jazmen Lindenbaum PA-C Interventional Radiology 04/15/2017 11:39 AM

## 2017-04-15 NOTE — Procedures (Signed)
Lumbar puncture done at L2/3 on right with 22G 12.5 cm needle. No immediate complications. See dictated radiology report for details

## 2017-04-15 NOTE — Telephone Encounter (Signed)
Put on cancellation list for a follow up appointment

## 2017-04-15 NOTE — Telephone Encounter (Signed)
Appt made with patient and she was placed on cancellation list.

## 2017-04-15 NOTE — Telephone Encounter (Signed)
Patient made aware. She only had done a couple hours ago but no difference in headaches at this time. Please advise.

## 2017-04-15 NOTE — Progress Notes (Signed)
Dr Darl PikesVeazy has been called x 2 with orders that followed x 2 for lumbar puncture site pain.

## 2017-04-15 NOTE — Telephone Encounter (Signed)
Left message on machine for patient to call back.

## 2017-04-17 ENCOUNTER — Telehealth: Payer: Self-pay | Admitting: Neurology

## 2017-04-17 NOTE — Telephone Encounter (Signed)
Patient called s/p lumbar puncture on Wednesday morning. She states yesterday morning she woke up with headache she describes as worse than recent headaches she has been experiencing. Headache located mainly at the top of her head and she describes the skin as feeling too tight for her skull. She has associated nausea, no vomiting, and dizziness. No other symptoms. She is drinking plenty of fluids. No falls.   Please advise. [phone].

## 2017-04-17 NOTE — Telephone Encounter (Signed)
Spoke with patient and she states that when she sits or stands it is worse. If she lays a certain way it gets better, but never completely goes away. I advised her to increase caffeine over the weekend and call back Monday if no better. Advised if turns into worse headache of her life, if she is unable to keep down fluids and feels dehydrated, or develops vision changes that she should be evaluated in the ER over the weekend.   She asked if there is any medication you could prescribe. She is aware we don't prescribe narcotics. Please advise.

## 2017-04-17 NOTE — Telephone Encounter (Signed)
Find out if the headache is positional.

## 2017-04-17 NOTE — Telephone Encounter (Signed)
Patient made aware.

## 2017-04-17 NOTE — Telephone Encounter (Signed)
If it is bad, she can go via ER for blood patch.  If she doesn't end up going I do believe that we can now schedule blood patches via [facility] but that wouldn't be done until next week.  Hydration is the key thing here.

## 2017-04-18 LAB — CSF CULTURE W GRAM STAIN: Culture: NO GROWTH

## 2017-04-18 LAB — CSF CULTURE

## 2017-05-28 ENCOUNTER — Ambulatory Visit: Payer: 59 | Admitting: Neurology

## 2017-05-28 DIAGNOSIS — Z029 Encounter for administrative examinations, unspecified: Secondary | ICD-10-CM

## 2017-05-28 MED FILL — CITALOPRAM HBR 10 MG TABLET: 10 | 90 days supply | Qty: 90 | Fill #2

## 2017-05-28 NOTE — Progress Notes (Deleted)
Cassandra Ibarra was seen in the neurology clinic today at the request of No ref. provider found.  She is accompanied by her mother who supplements the history.  Her husband is on facetime in the room at her request.  The consultation is for the evaluation of headache and blurred disc margins on eye examination.  The records that were made available to me were reviewed.  Patient was seen by her optometrist on 03/17/2017 for her routine examination.  Her ocular pressures were normal.  Dilated fundus exam revealed slight blurred disc margins (it does not state if this is unilateral or bilateral).  I do not have her complete examination.  I do not believe that visual field testing was performed.  While her evaluation does state that she has slight blurred disc margins, it also stated that the patient had headaches with "normal ocular health."  Pt states that she has noted eye watering with some short, temporary blurriness and she has thought that this was allergies.  The patient does admit to headaches.  The patient reports that headache began about 3 weeks ago.  The location of the headache is in the bilateral temporal or around the eyes.  The headaches may be around the eyes as well. The headache is described as a pressure.  It will last hours.  She treats with excedrin migraine and it may or may not help. There is photophobia with fluroescent light but not with sunlight.  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  The patient reports that there is rarely associated nausea and no emesis.  The patient reports no rhinorrhea or conjunctival erythema.    She is not taking any vitamins or OTC supplements (rarely will take vit D but not regularly).  She is not on estrogen containing contraceptives.  She got off of those in Feb.  She is not using anything for contraception but states that she is not trying to get pregnant.   States that she has PCOD.  05/28/17 update:  Pt seen in f/u accompanied by *** who supplements the history.  She had her LP on 04/15/17.  Unfortunately, opening pressure was not completed which was the reason for requesting the LP.  Since that time, she continues to have headache.  Headache is described as pressure.  It is in the bilateral temporal regions or around the eyes.  It occurs ***days per week.  MRI of the brain was done at [facility] on 03/26/2017.  They sent no films.  It was reported to be normal.   PREVIOUS MEDICATIONS: n/a  ALLERGIES:   Allergies  Allergen Reactions  . Ocella [Drospirenone-Ethinyl Estradiol] Shortness Of Breath  . Adhesive [Tape] Itching  . Lo Loestrin Fe [Norethin-Eth Estrad-Fe Biphas] Palpitations  . Lorazepam Other (See Comments)    "makes me angry"    CURRENT MEDICATIONS:  Current Outpatient Prescriptions on File Prior to Visit  Medication Sig Dispense Refill  . citalopram (CELEXA) 10 MG tablet Take 10 mg by mouth daily.     No current facility-administered medications on file prior to visit.     PAST MEDICAL HISTORY:   Past Medical History:  Diagnosis Date  . Depression   . 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 Gastric ulcer   . OCD (obsessive compulsive disorder)   . PCOS (polycystic ovarian syndrome)   . PONV (postoperative nausea  and vomiting)     PAST SURGICAL HISTORY:   Past Surgical History:  Procedure Laterality Date  . ESOPHAGOGASTRODUODENOSCOPY (EGD) WITH PROPOFOL N/A 02/09/2014   Procedure: ESOPHAGOGASTRODUODENOSCOPY (EGD) WITH PROPOFOL;  Surgeon: Theda BelfastPatrick D Hung, MD;  Location[facility] ENDOSCOPY;  Service: Endoscopy;  Laterality: N/A;  . WISDOM TOOTH EXTRACTION Left 09/2013    SOCIAL HISTORY:   Social History   Social History  . Marital status: Married    Spouse name: N/A  . Number of children: N/A  . Years of education: N/A   Occupational History  .      medicare wellness visits   Social History Main Topics  .  Smoking status: Never Smoker  . Smokeless tobacco: Never Used  . Alcohol use Yes     Comment: couple times a month  . Drug use: No  . Sexual activity: Yes    Partners: Male   Other Topics Concern  . Not on file   Social History Narrative  . No narrative on file    FAMILY HISTORY:   Family Status  Relation Status  . Mother Alive  . Father Alive  . Sister Alive  . Neg Hx (Not Specified)    ROS:  IA complete 10 system review of systems was obtained and was unremarkable apart from what is mentioned above.  PHYSICAL EXAMINATION:    VITALS:   There were no vitals filed for this visit.  GEN:  Appears stated age and in NAD. HEENT:  Normocephalic, atraumatic. The mucous membranes are moist. The superficial temporal arteries are without ropiness or tenderness. Cardiovascular: Tachycardic with regular rhythm. Lungs: Clear to auscultation bilaterally. Neck: There are no carotid bruits noted bilaterally.  NEUROLOGICAL: Orientation:  Pt is alert and oriented x 3.  Fund of knowledge is appropriate.  Recent and remote memory intact.  Attention and concentration normal.  Able to repeat and name. Cranial nerves: There is good facial symmetry. The pupils are equal round and reactive to light bilaterally. Funduscopic exam is attempted but the disc margins are not well visualized.  The vessels look good. Extraocular muscles are intact and visual fields are full to confrontational testing. Speech is fluent and clear. Soft palate rises symmetrically and there is no tongue deviation. Hearing is intact to conversational tone. Tone: Tone is good throughout. Sensation: Sensation is intact to light touch and pinprick throughout (facial, extremities, trunk). Vibration is intact at the bilateral big toe. There is no extinction with double simultaneous stimulation. There is no sensory dermatomal level identified. Coordination:  The patient has no difficulty with RAM's or FNF bilaterally. Motor: Strength  is 5/5 in the bilateral upper and lower extremities. Shoulder shrug is equal and symmetric.  There is no pronator drift.  There are no fasciculations noted. DTR's: Deep tendon reflexes are 2+/4 at the bilateral biceps, triceps, brachioradialis, patella and achilles.  Plantar responses are downgoing bilaterally. Gait and Station: The patient is able to ambulate without difficulty. The patient is able to heel toe walk without any difficulty. The patient is able to ambulate in a tandem fashion. The patient is able to stand in the Romberg position.   IMPRESSIONS/PLAN:  1.  Headache.    -Her headaches do not necessarily sound like those characteristic of pseudotumor cerebri.  Lumbar puncture was completed, and the main reason for this was to obtain opening pressure.  Unfortunately, radiology was unable to obtain opening pressure.  We decided to treat her with topamax, since it has acetylcholinesterase inhibitor properties and can  help headache as well.  We talked her risks, benefits, and side effects.  ***We talked about the importance of being on birth control.  Talked about the fact that if she chooses oral contraceptives, Topamax can decrease the efficacy.   2.  ***   CC:  System, Pcp Not In

## 2017-06-01 ENCOUNTER — Encounter: Payer: Self-pay | Admitting: Neurology

## 2017-06-10 DIAGNOSIS — Z6841 Body Mass Index (BMI) 40.0 and over, adult: Secondary | ICD-10-CM | POA: Diagnosis not present

## 2017-06-10 DIAGNOSIS — Z01419 Encounter for gynecological examination (general) (routine) without abnormal findings: Secondary | ICD-10-CM | POA: Diagnosis not present

## 2017-06-10 DIAGNOSIS — E282 Polycystic ovarian syndrome: Secondary | ICD-10-CM | POA: Diagnosis not present

## 2017-06-10 DIAGNOSIS — R5383 Other fatigue: Secondary | ICD-10-CM | POA: Diagnosis not present

## 2017-06-12 MED FILL — VIT D2 1.25 MG (50,000 UNIT: 1.25 MG | 28 days supply | Qty: 4 | Fill #0

## 2017-06-12 MED FILL — metFORMIN HCL 500 MG TABS: 500 | 90 days supply | Qty: 180 | Fill #0

## 2017-06-12 MED FILL — MEDROXYPROGESTERONE 10 MG T: 10 | 7 days supply | Qty: 7 | Fill #0

## 2017-06-24 NOTE — Progress Notes (Deleted)
Cassandra Ibarra was seen in the neurology clinic today at the request of No ref. provider found.  She is accompanied by her mother who supplements the history.  Her husband is on facetime in the room at her request.  The consultation is for the evaluation of headache and blurred disc margins on eye examination.  The records that were made available to me were reviewed.  Patient was seen by her optometrist on 03/17/2017 for her routine examination.  Her ocular pressures were normal.  Dilated fundus exam revealed slight blurred disc margins (it does not state if this is unilateral or bilateral).  I do not have her complete examination.  I do not believe that visual field testing was performed.  While her evaluation does state that she has slight blurred disc margins, it also stated that the patient had headaches with "normal ocular health."  Pt states that she has noted eye watering with some short, temporary blurriness and she has thought that this was allergies.  The patient does admit to headaches.  The patient reports that headache began about 3 weeks ago.  The location of the headache is in the bilateral temporal or around the eyes.  The headaches may be around the eyes as well. The headache is described as a pressure.  It will last hours.  She treats with excedrin migraine and it may or may not help. There is photophobia with fluroescent light but not with sunlight.  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  The patient reports that there is rarely associated nausea and no emesis.  The patient reports no rhinorrhea or conjunctival erythema.    She is not taking any vitamins or OTC supplements (rarely will take vit D but not regularly).  She is not on estrogen containing contraceptives.  She got off of those in Feb.  She is not using anything for contraception but states that she is not trying to get pregnant.   States that she has PCOD.  06/25/17 update:  Pt seen in f/u accompanied by *** who supplements the history.  She had her LP on 04/15/17.  Unfortunately, opening pressure was not completed which was the reason for requesting the LP.  Since that time, she continues to have headache.  Headache is described as pressure.  It is in the bilateral temporal regions or around the eyes.  It occurs ***days per week.  MRI of the brain was done at [facility] on 03/26/2017.  They sent no films.  It was reported to be normal.   PREVIOUS MEDICATIONS: n/a  ALLERGIES:   Allergies  Allergen Reactions  . Ocella [Drospirenone-Ethinyl Estradiol] Shortness Of Breath  . Adhesive [Tape] Itching  . Lo Loestrin Fe [Norethin-Eth Estrad-Fe Biphas] Palpitations  . Lorazepam Other (See Comments)    "makes me angry"    CURRENT MEDICATIONS:  Current Outpatient Prescriptions on File Prior to Visit  Medication Sig Dispense Refill  . citalopram (CELEXA) 10 MG tablet Take 10 mg by mouth daily.     No current facility-administered medications on file prior to visit.     PAST MEDICAL HISTORY:   Past Medical History:  Diagnosis Date  . Depression   . 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 Gastric ulcer   . OCD (obsessive compulsive disorder)   . PCOS (polycystic ovarian syndrome)   . PONV (postoperative nausea  and vomiting)     PAST SURGICAL HISTORY:   Past Surgical History:  Procedure Laterality Date  . ESOPHAGOGASTRODUODENOSCOPY (EGD) WITH PROPOFOL N/A 02/09/2014   Procedure: ESOPHAGOGASTRODUODENOSCOPY (EGD) WITH PROPOFOL;  Surgeon: Theda BelfastPatrick D Hung, MD;  [facility];  Service: Endoscopy;  Laterality: N/A;  . WISDOM TOOTH EXTRACTION Left 09/2013    SOCIAL HISTORY:   Social History   Social History  . Marital status: Married    Spouse name: N/A  . Number of children: N/A  . Years of education: N/A   Occupational History  .      medicare wellness visits   Social History Main Topics  .  Smoking status: Never Smoker  . Smokeless tobacco: Never Used  . Alcohol use Yes     Comment: couple times a month  . Drug use: No  . Sexual activity: Yes    Partners: Male   Other Topics Concern  . Not on file   Social History Narrative  . No narrative on file    FAMILY HISTORY:   Family Status  Relation Status  . Mother Alive  . Father Alive  . Sister Alive  . Neg Hx (Not Specified)    ROS:  IA complete 10 system review of systems was obtained and was unremarkable apart from what is mentioned above.  PHYSICAL EXAMINATION:    VITALS:   There were no vitals filed for this visit.  GEN:  Appears stated age and in NAD. HEENT:  Normocephalic, atraumatic. The mucous membranes are moist. The superficial temporal arteries are without ropiness or tenderness. Cardiovascular: Tachycardic with regular rhythm. Lungs: Clear to auscultation bilaterally. Neck: There are no carotid bruits noted bilaterally.  NEUROLOGICAL: Orientation:  Pt is alert and oriented x 3.  Fund of knowledge is appropriate.  Recent and remote memory intact.  Attention and concentration normal.  Able to repeat and name. Cranial nerves: There is good facial symmetry. The pupils are equal round and reactive to light bilaterally. Funduscopic exam is attempted but the disc margins are not well visualized.  The vessels look good. Extraocular muscles are intact and visual fields are full to confrontational testing. Speech is fluent and clear. Soft palate rises symmetrically and there is no tongue deviation. Hearing is intact to conversational tone. Tone: Tone is good throughout. Sensation: Sensation is intact to light touch and pinprick throughout (facial, extremities, trunk). Vibration is intact at the bilateral big toe. There is no extinction with double simultaneous stimulation. There is no sensory dermatomal level identified. Coordination:  The patient has no difficulty with RAM's or FNF bilaterally. Motor: Strength  is 5/5 in the bilateral upper and lower extremities. Shoulder shrug is equal and symmetric.  There is no pronator drift.  There are no fasciculations noted. DTR's: Deep tendon reflexes are 2+/4 at the bilateral biceps, triceps, brachioradialis, patella and achilles.  Plantar responses are downgoing bilaterally. Gait and Station: The patient is able to ambulate without difficulty. The patient is able to heel toe walk without any difficulty. The patient is able to ambulate in a tandem fashion. The patient is able to stand in the Romberg position.   IMPRESSIONS/PLAN:  1.  Headache.    -Her headaches do not necessarily sound like those characteristic of pseudotumor cerebri.  Lumbar puncture was completed, and the main reason for this was to obtain opening pressure.  Unfortunately, radiology was unable to obtain opening pressure.  We decided to treat her with topamax, since it has acetylcholinesterase inhibitor properties and can  help headache as well.  We talked her risks, benefits, and side effects.  ***We talked about the importance of being on birth control.  Talked about the fact that if she chooses oral contraceptives, Topamax can decrease the efficacy.   2.  ***   CC:  System, Pcp Not In

## 2017-06-25 ENCOUNTER — Ambulatory Visit: Payer: 59 | Admitting: Neurology

## 2017-06-25 NOTE — Progress Notes (Deleted)
Cassandra Ibarra was seen in the neurology clinic today at the request of No ref. provider found.  She is accompanied by her mother who supplements the history.  Her husband is on facetime in the room at her request.  The consultation is for the evaluation of headache and blurred disc margins on eye examination.  The records that were made available to me were reviewed.  Patient was seen by her optometrist on 03/17/2017 for her routine examination.  Her ocular pressures were normal.  Dilated fundus exam revealed slight blurred disc margins (it does not state if this is unilateral or bilateral).  I do not have her complete examination.  I do not believe that visual field testing was performed.  While her evaluation does state that she has slight blurred disc margins, it also stated that the patient had headaches with "normal ocular health."  Pt states that she has noted eye watering with some short, temporary blurriness and she has thought that this was allergies.  The patient does admit to headaches.  The patient reports that headache began about 3 weeks ago.  The location of the headache is in the bilateral temporal or around the eyes.  The headaches may be around the eyes as well. The headache is described as a pressure.  It will last hours.  She treats with excedrin migraine and it may or may not help. There is photophobia with fluroescent light but not with sunlight.  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  The patient reports that there is rarely associated nausea and no emesis.  The patient reports no rhinorrhea or conjunctival erythema.    She is not taking any vitamins or OTC supplements (rarely will take vit D but not regularly).  She is not on estrogen containing contraceptives.  She got off of those in Feb.  She is not using anything for contraception but states that she is not trying to get pregnant.   States that she has PCOD.  06/29/17 update:  Pt seen in f/u accompanied by *** who supplements the history.  She had her LP on 04/15/17.  Unfortunately, opening pressure was not completed which was the reason for requesting the LP.  She has no showed her last 2 follow up appointments with me.  Since that time, she continues to have headache.  Headache is described as pressure.  It is in the bilateral temporal regions or around the eyes.  It occurs ***days per week.  MRI of the brain was done at [place] on 03/26/2017.  They sent no films.  It was reported to be normal.   PREVIOUS MEDICATIONS: n/a  ALLERGIES:   Allergies  Allergen Reactions  . Ocella [Drospirenone-Ethinyl Estradiol] Shortness Of Breath  . Adhesive [Tape] Itching  . Lo Loestrin Fe [Norethin-Eth Estrad-Fe Biphas] Palpitations  . Lorazepam Other (See Comments)    "makes me angry"    CURRENT MEDICATIONS:  Current Outpatient Prescriptions on File Prior to Visit  Medication Sig Dispense Refill  . citalopram (CELEXA) 10 MG tablet Take 10 mg by mouth daily.     No current facility-administered medications on file prior to visit.     PAST MEDICAL HISTORY:   Past Medical History:  Diagnosis Date  . Depression   . 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 Gastric ulcer   . OCD (obsessive compulsive disorder)   .  PCOS (polycystic ovarian syndrome)   . PONV (postoperative nausea and vomiting)     PAST SURGICAL HISTORY:   Past Surgical History:  Procedure Laterality Date  . ESOPHAGOGASTRODUODENOSCOPY (EGD) WITH PROPOFOL N/A 02/09/2014   Procedure: ESOPHAGOGASTRODUODENOSCOPY (EGD) WITH PROPOFOL;  Surgeon: Theda BelfastPatrick D Hung, MD;  [facility];  Service: Endoscopy;  Laterality: N/A;  . WISDOM TOOTH EXTRACTION Left 09/2013    SOCIAL HISTORY:   Social History   Social History  . Marital status: Married    Spouse name: N/A  . Number of children: N/A  . Years of education: N/A   Occupational History  .       medicare wellness visits   Social History Main Topics  . Smoking status: Never Smoker  . Smokeless tobacco: Never Used  . Alcohol use Yes     Comment: couple times a month  . Drug use: No  . Sexual activity: Yes    Partners: Male   Other Topics Concern  . Not on file   Social History Narrative  . No narrative on file    FAMILY HISTORY:   Family Status  Relation Status  . Mother Alive  . Father Alive  . Sister Alive  . Neg Hx (Not Specified)    ROS:  IA complete 10 system review of systems was obtained and was unremarkable apart from what is mentioned above.  PHYSICAL EXAMINATION:    VITALS:   There were no vitals filed for this visit.  GEN:  Appears stated age and in NAD. HEENT:  Normocephalic, atraumatic. The mucous membranes are moist. The superficial temporal arteries are without ropiness or tenderness. Cardiovascular: Tachycardic with regular rhythm. Lungs: Clear to auscultation bilaterally. Neck: There are no carotid bruits noted bilaterally.  NEUROLOGICAL: Orientation:  Pt is alert and oriented x 3.  Fund of knowledge is appropriate.  Recent and remote memory intact.  Attention and concentration normal.  Able to repeat and name. Cranial nerves: There is good facial symmetry. The pupils are equal round and reactive to light bilaterally. Funduscopic exam is attempted but the disc margins are not well visualized.  The vessels look good. Extraocular muscles are intact and visual fields are full to confrontational testing. Speech is fluent and clear. Soft palate rises symmetrically and there is no tongue deviation. Hearing is intact to conversational tone. Tone: Tone is good throughout. Sensation: Sensation is intact to light touch and pinprick throughout (facial, extremities, trunk). Vibration is intact at the bilateral big toe. There is no extinction with double simultaneous stimulation. There is no sensory dermatomal level identified. Coordination:  The patient has no  difficulty with RAM's or FNF bilaterally. Motor: Strength is 5/5 in the bilateral upper and lower extremities. Shoulder shrug is equal and symmetric.  There is no pronator drift.  There are no fasciculations noted. DTR's: Deep tendon reflexes are 2+/4 at the bilateral biceps, triceps, brachioradialis, patella and achilles.  Plantar responses are downgoing bilaterally. Gait and Station: The patient is able to ambulate without difficulty. The patient is able to heel toe walk without any difficulty. The patient is able to ambulate in a tandem fashion. The patient is able to stand in the Romberg position.   IMPRESSIONS/PLAN:  1.  Headache.    -Her headaches do not necessarily sound like those characteristic of pseudotumor cerebri.  Lumbar puncture was completed, and the main reason for this was to obtain opening pressure.  Unfortunately, radiology was unable to obtain opening pressure.  We decided to treat her  with topamax, since it has acetylcholinesterase inhibitor properties and can help headache as well.  We talked her risks, benefits, and side effects.  ***We talked about the importance of being on birth control.  Talked about the fact that if she chooses oral contraceptives, Topamax can decrease the efficacy.   2.  ***   CC:  System, Pcp Not In

## 2017-06-29 ENCOUNTER — Ambulatory Visit: Payer: 59 | Admitting: Neurology

## 2017-06-29 ENCOUNTER — Encounter: Payer: Self-pay | Admitting: Neurology

## 2017-07-03 ENCOUNTER — Telehealth: Payer: Self-pay | Admitting: Neurology

## 2017-07-03 NOTE — Telephone Encounter (Signed)
Patient dismissed from [facility] by Lurena Joinerebecca Tat DO , effective June 29, 2017. Dismissal letter sent out by certified / registered mail.  daj

## 2017-07-08 NOTE — Telephone Encounter (Signed)
Noted. Dr. Carmel Sacramentoat fyi.

## 2017-07-08 NOTE — Telephone Encounter (Signed)
Patient called in regarding a letter that she was to receive from Dr. Arbutus Leasat. She said the letter was lost by the Postal Service. She wanted to let you know that her Grandmother had passed away during the time of the no shows. Thanks

## 2017-07-09 NOTE — Telephone Encounter (Signed)
Thank you.  She had called the morning of the last 2 no shows and said that she was sick with one and that her car broke down on the way with the other.  Appreciate the update.

## 2017-07-17 ENCOUNTER — Ambulatory Visit: Payer: 59 | Admitting: Neurology

## 2017-08-26 NOTE — Telephone Encounter (Signed)
Certified dismissal letter returned as undeliverable, unclaimed, return to sender after three attempts by [employer] on August 26, 2017. Letter placed in another envelope and resent as 1st class mail which does not require a signature. daj

## 2017-09-17 MED FILL — VIT D2 1.25 MG (50,000 UNIT: 1.25 MG | 28 days supply | Qty: 4 | Fill #1

## 2017-10-20 ENCOUNTER — Ambulatory Visit: Payer: 59 | Admitting: Neurology

## 2017-12-02 MED FILL — VIT D2 1.25 MG (50,000 UNIT: 1.25 MG | 84 days supply | Qty: 12 | Fill #2

## 2017-12-02 MED FILL — SHIPPING COST: 1 days supply | Qty: 1 | Fill #0

## 2017-12-02 MED FILL — CITALOPRAM HBR 10 MG TABLET: 10 | 90 days supply | Qty: 90 | Fill #0

## 2018-01-15 ENCOUNTER — Ambulatory Visit (INDEPENDENT_AMBULATORY_CARE_PROVIDER_SITE_OTHER): Payer: Self-pay | Admitting: Family Medicine

## 2018-01-15 ENCOUNTER — Encounter: Payer: Self-pay | Admitting: Family Medicine

## 2018-01-15 VITALS — BP 128/86 | HR 94 | Temp 98.4°F | Resp 22 | Wt >= 6400 oz

## 2018-01-15 DIAGNOSIS — L304 Erythema intertrigo: Secondary | ICD-10-CM

## 2018-01-15 MED ORDER — FLUCONAZOLE 150 MG PO TABS: 150.0000 mg | ORAL_TABLET | ORAL | 0 refills | Status: DC

## 2018-01-15 MED ORDER — KETOCONAZOLE 2 % EX CREA: 1.0000 "application " | TOPICAL_CREAM | Freq: Every day | CUTANEOUS | 0 refills | Status: AC

## 2018-01-15 NOTE — Patient Instructions (Signed)
Intertrigo Intertrigo is skin irritation (inflammation) that happens in warm, moist areas of the body. The irritation can cause a rash and make skin raw and itchy. The rash is usually pink or red. It happens mostly between folds of skin or where skin rubs together, such as:  Toes.  Armpits.  Groin.  Belly.  Breasts.  Buttocks.  This condition is not passed from person to person (is not contagious). Follow these instructions at home:  Keep the affected area clean and dry.  Do not scratch your skin.  Stay cool as much as possible. Use an air conditioner or fan, if you can.  Apply over-the-counter and prescription medicines only as told by your doctor.  If you were prescribed an antibiotic medicine, use it as told by your doctor. Do not stop using the antibiotic even if your condition starts to get better.  Keep all follow-up visits as told by your doctor. This is important. How is this prevented?  Stay at a healthy weight.  Keep your feet dry. This is very important if you have diabetes. Wear cotton or wool socks.  Take care of and protect the skin in your groin and butt area as told by your doctor.  Do not wear tight clothes. Wear clothes that: ? Are loose. ? Take away moisture from your body. ? Are made of cotton.  Wear a bra that gives good support, if needed.  Shower and dry yourself fully after being active.  Keep your blood sugar under control if you have diabetes. Contact a doctor if:  Your symptoms do not get better with treatment.  Your symptoms get worse or they spread.  You notice more redness and warmth.  You have a fever. This information is not intended to replace advice given to you by your health care provider. Make sure you discuss any questions you have with your health care provider. Document Released: 09/20/2010 Document Revised: 01/24/2016 Document Reviewed: 02/19/2015 Elsevier Interactive Patient Education  2018 Elsevier Inc.  

## 2018-01-15 NOTE — Progress Notes (Signed)
Cassandra Ibarra is a 27 y.o. female who presents today with concerns of moisture, pain and smell under pannus of abdomen. Patient reports this condition had its last exacerbation 2 years prior and was treated with oral medication.       Review of Systems  Constitutional: Negative for chills, fever and malaise/fatigue.  HENT: Negative for congestion, ear discharge, ear pain, sinus pain and sore throat.   Eyes: Negative.   Respiratory: Negative for cough, sputum production and shortness of breath.   Cardiovascular: Negative.  Negative for chest pain.  Gastrointestinal: Negative for abdominal pain, diarrhea, nausea and vomiting.  Genitourinary: Negative for dysuria, frequency, hematuria and urgency.  Musculoskeletal: Negative for myalgias.  Skin: Positive for itching and rash.  Neurological: Negative for headaches.  Endo/Heme/Allergies: Negative.   Psychiatric/Behavioral: Negative.     O: Vitals:   01/15/18 1407  BP: 128/86  Pulse: 94  Resp: (!) 22  Temp: 98.4 F (36.9 C)  SpO2: 96%     Physical Exam  Constitutional: She is oriented to person, place, and time. Vital signs are normal. She appears well-developed and well-nourished. She is active.  Non-toxic appearance. She does not have a sickly appearance.  HENT:  Head: Normocephalic.  Right Ear: Hearing, tympanic membrane, external ear and ear canal normal.  Left Ear: Hearing, tympanic membrane, external ear and ear canal normal.  Nose: Nose normal.  Mouth/Throat: Uvula is midline and oropharynx is clear and moist.  Neck: Normal range of motion. Neck supple.  Cardiovascular: Normal rate, regular rhythm, normal heart sounds and normal pulses.  Pulmonary/Chest: Effort normal and breath sounds normal.  Abdominal: Soft. Bowel sounds are normal.  Musculoskeletal: Normal range of motion.  Lymphadenopathy:       Head (right side): No submental and no submandibular adenopathy present.       Head (left side): No submental and no  submandibular adenopathy present.    She has no cervical adenopathy.  Neurological: She is alert and oriented to person, place, and time.  Skin: Rash noted.     Large area of skin maceration under the pannicular area very moist with accompanying smell.  Psychiatric: She has a normal mood and affect.  Vitals reviewed.  A: 1. Intertrigo     P:  Oral and topical treatment selected due to severity and size of affected area. Advised patient if this treatment was not successful that additional follow up would need to be handled with PCP for chronic condition management. Patient reports a history of monistat allergy but reports that has successfully take fluconazole multiple times without incident or allergy.  Exam findings, diagnosis etiology and medication use and indications reviewed with patient. Follow- Up and discharge instructions provided. No emergent/urgent issues found on exam.  Patient verbalized understanding of information provided and agrees with plan of care (POC), all questions answered.  1. Intertrigo - fluconazole (DIFLUCAN) 150 MG tablet; Take 1 tablet (150 mg total) by mouth once a week. - ketoconazole (NIZORAL) 2 % cream; Apply 1 application topically daily for 14 days.  Other orders - Vitamin D, Ergocalciferol, (DRISDOL) 50000 units CAPS capsule; Take 50,000 Units by mouth every 7 (seven) days.

## 2018-02-26 ENCOUNTER — Encounter (HOSPITAL_COMMUNITY): Payer: Self-pay | Admitting: Emergency Medicine

## 2018-02-26 ENCOUNTER — Ambulatory Visit (HOSPITAL_COMMUNITY)
Admit: 2018-02-26 | Discharge: 2018-02-26 | Disposition: A | Discharge status: Home / Self Care | Payer: 59 | Attending: Family Medicine | Admitting: Family Medicine

## 2018-02-26 ENCOUNTER — Ambulatory Visit (HOSPITAL_COMMUNITY)
Admission: EM | Admit: 2018-02-26 | Discharge: 2018-02-26 | Disposition: A | Discharge status: Home / Self Care | Payer: 59 | Attending: Family Medicine | Admitting: Family Medicine

## 2018-02-26 DIAGNOSIS — F329 Major depressive disorder, single episode, unspecified: Secondary | ICD-10-CM | POA: Diagnosis not present

## 2018-02-26 DIAGNOSIS — M549 Dorsalgia, unspecified: Secondary | ICD-10-CM | POA: Insufficient documentation

## 2018-02-26 DIAGNOSIS — F429 Obsessive-compulsive disorder, unspecified: Secondary | ICD-10-CM | POA: Insufficient documentation

## 2018-02-26 DIAGNOSIS — Z3202 Encounter for pregnancy test, result negative: Secondary | ICD-10-CM

## 2018-02-26 DIAGNOSIS — Z8719 Personal history of other diseases of the digestive system: Secondary | ICD-10-CM | POA: Insufficient documentation

## 2018-02-26 DIAGNOSIS — M545 Low back pain: Secondary | ICD-10-CM

## 2018-02-26 DIAGNOSIS — Z888 Allergy status to other drugs, medicaments and biological substances status: Secondary | ICD-10-CM | POA: Insufficient documentation

## 2018-02-26 DIAGNOSIS — E282 Polycystic ovarian syndrome: Secondary | ICD-10-CM | POA: Diagnosis not present

## 2018-02-26 DIAGNOSIS — R1030 Lower abdominal pain, unspecified: Secondary | ICD-10-CM | POA: Diagnosis not present

## 2018-02-26 DIAGNOSIS — R102 Pelvic and perineal pain: Secondary | ICD-10-CM | POA: Diagnosis not present

## 2018-02-26 LAB — POCT URINALYSIS DIP (DEVICE)
GLUCOSE, UA: NEGATIVE mg/dL
Hgb urine dipstick: NEGATIVE
KETONES UR: NEGATIVE mg/dL
LEUKOCYTES UA: NEGATIVE
Nitrite: NEGATIVE
PROTEIN: NEGATIVE mg/dL
Specific Gravity, Urine: >=1.03 (ref 1.005–1.030)
Urobilinogen, UA: 0.2 mg/dL (ref 0.0–1.0)
pH: 5 (ref 5.0–8.0)

## 2018-02-26 LAB — CBC WITH DIFFERENTIAL/PLATELET
ABS IMMATURE GRANULOCYTES: 0 10*3/uL (ref 0.0–0.1)
Basophils Absolute: 0.1 10*3/uL (ref 0.0–0.1)
Basophils Relative: 1 %
Eosinophils Absolute: 0.3 10*3/uL (ref 0.0–0.7)
Eosinophils Relative: 3 %
HCT: 44.5 % (ref 36.0–46.0)
HEMOGLOBIN: 14.4 g/dL (ref 12.0–15.0)
IMMATURE GRANULOCYTES: 0 %
LYMPHS ABS: 4 10*3/uL (ref 0.7–4.0)
LYMPHS PCT: 40 %
MCH: 28.2 pg (ref 26.0–34.0)
MCHC: 32.4 g/dL (ref 30.0–36.0)
MCV: 87.1 fL (ref 78.0–100.0)
MONOS PCT: 6 %
Monocytes Absolute: 0.6 10*3/uL (ref 0.1–1.0)
NEUTROS PCT: 50 %
Neutro Abs: 5 10*3/uL (ref 1.7–7.7)
PLATELETS: 366 10*3/uL (ref 150–400)
RBC: 5.11 MIL/uL (ref 3.87–5.11)
RDW: 12.5 % (ref 11.5–15.5)
WBC: 9.9 10*3/uL (ref 4.0–10.5)

## 2018-02-26 LAB — POCT I-STAT, CHEM 8
BUN: 11 mg/dL (ref 6–20)
CALCIUM ION: 1.2 mmol/L (ref 1.15–1.40)
CHLORIDE: 102 mmol/L (ref 98–111)
Creatinine, Ser: 0.7 mg/dL (ref 0.44–1.00)
Glucose, Bld: 100 mg/dL — ABNORMAL HIGH (ref 70–99)
HEMATOCRIT: 43 % (ref 36.0–46.0)
Hemoglobin: 14.6 g/dL (ref 12.0–15.0)
Potassium: 4.1 mmol/L (ref 3.5–5.1)
SODIUM: 139 mmol/L (ref 135–145)
TCO2: 23 mmol/L (ref 22–32)

## 2018-02-26 LAB — POCT PREGNANCY, URINE: PREG TEST UR: NEGATIVE

## 2018-02-26 NOTE — Discharge Instructions (Signed)
Home today Clear liquids May take some ibuprofen for pain (800 mg)  You are given an appointment for an ultrasound at 4:00.  I will call you with your test results. If you get worse in the meantime with increasing pain, fever, or vomiting you need to go to the emergency room

## 2018-02-26 NOTE — ED Provider Notes (Signed)
[facility]    CSN: [id number] Arrival date & time: 02/26/18  1238     History   Chief Complaint Chief Complaint  Patient presents with  . Pelvic Pain  . Back Pain    HPI Cassandra Ibarra is a 27 y.o. female.   HPI  Patient is here for lower abdominal pain.  She states it started suddenly yesterday.  She initially felt suprapubic pressure.  Then started radiating to her low back.  Is now in both lower abdominal areas, and her low back.  The pain is severe.  Is causing her to feel nauseated.  She has not had any vomiting.  No fever or chills.  No urinary complaints or dysuria.  She does not menstruate because of polycystic ovarian syndrome.  No diarrhea, no constipation, no blood in her bowels.  No history of recurring urinary tract infections, no history of kidney stones or problems.  No history of pelvic infections or problems.  She is married.  Monogamous.  She does not think there is any chance that she is pregnant.  She is never had pain like this before.  Sitting makes her pain worse, and she feels better when she is lying down.  Past Medical History:  Diagnosis Date  . Depression   . 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 Gastric ulcer   . OCD (obsessive compulsive disorder)   . PCOS (polycystic ovarian syndrome)   . PONV (postoperative nausea and vomiting)     Patient Active Problem List   Diagnosis Date Noted  . PCOS (polycystic ovarian syndrome) 05/18/2015    Past Surgical History:  Procedure Laterality Date  . ESOPHAGOGASTRODUODENOSCOPY (EGD) WITH PROPOFOL N/A 02/09/2014   Procedure: ESOPHAGOGASTRODUODENOSCOPY (EGD) WITH PROPOFOL;  Surgeon: Theda Belfast, MD;  Location: [facility] ENDOSCOPY;  Service: Endoscopy;  Laterality: N/A;  . WISDOM TOOTH EXTRACTION Left 09/2013    OB History   None      Home Medications    Prior to Admission medications   Medication Sig Start Date End Date Taking? Authorizing Provider  Vitamin D, Ergocalciferol,  (DRISDOL) 50000 units CAPS capsule Take 50,000 Units by mouth every 7 (seven) days.    [provider]    Family History Family History  Problem Relation Age of Onset  . Arthritis Mother   . Heart disease Father   . Hyperlipidemia Father   . Crohn's disease Neg Hx     Social History Social History   Tobacco Use  . Smoking status: Never Smoker  . Smokeless tobacco: Never Used  Substance Use Topics  . Alcohol use: Yes    Comment: couple times a month  . Drug use: No     Allergies   Ocella [drospirenone-ethinyl estradiol]; Adhesive [tape]; Lo loestrin fe [norethin-eth estrad-fe biphas]; Lorazepam; and Monistat [tioconazole]   Review of Systems Review of Systems  Constitutional: Positive for appetite change. Negative for chills, diaphoresis and fever.  HENT: Negative for ear pain and sore throat.   Eyes: Negative for pain and visual disturbance.  Respiratory: Negative for cough and shortness of breath.   Cardiovascular: Negative for chest pain and palpitations.  Gastrointestinal: Positive for abdominal pain and nausea. Negative for blood in stool, constipation, diarrhea and vomiting.  Genitourinary: Positive for pelvic pain. Negative for dysuria, flank pain, hematuria, vaginal bleeding and vaginal discharge.  Musculoskeletal: Positive for back pain. Negative for arthralgias.  Skin: Negative for color change and rash.  Neurological: Negative for dizziness, seizures, syncope and  headaches.  Psychiatric/Behavioral: Positive for sleep disturbance. The patient is not nervous/anxious.   All other systems reviewed and are negative.    Physical Exam Triage Vital Signs ED Triage Vitals [02/26/18 1256]  Enc Vitals Group     BP (!) 139/96     Pulse Rate 97     Resp 18     Temp 98 F (36.7 C)     Temp src      SpO2 97 %     Weight      Height      Head Circumference      Peak Flow      Pain Score      Pain Loc      Pain Edu?      Excl. in GC?    No data  found.  Updated Vital Signs BP (!) 139/96   Pulse 97   Temp 98 F (36.7 C)   Resp 18   SpO2 97%       Physical Exam  Constitutional: She is oriented to person, place, and time. She appears well-developed and well-nourished. No distress.  Acutely uncomfortable.  Guarded movements.  Super obese.  HENT:  Head: Normocephalic and atraumatic.  Right Ear: External ear normal.  Left Ear: External ear normal.  Mouth/Throat: Oropharynx is clear and moist.  Eyes: Pupils are equal, round, and reactive to light. Conjunctivae are normal.  Neck: Normal range of motion. Neck supple. No thyromegaly present.  Cardiovascular: Normal rate and normal heart sounds.  Multiple ectopic beats  Pulmonary/Chest: Effort normal and breath sounds normal. No respiratory distress. She has no rales.  Abdominal: Soft. Bowel sounds are normal. She exhibits no distension and no mass. There is no hepatosplenomegaly. There is tenderness in the suprapubic area. There is rebound and guarding. There is no rigidity and no CVA tenderness.  Abdominal detail difficult because of obesity and large pannus.  General tenderness in the suprapubic region with mild guarding and definite rebound.  Musculoskeletal: Normal range of motion. She exhibits no edema.  No CVA tenderness.  No tenderness in the lumbar spine or pelvis bony structures or muscles  Neurological: She is alert and oriented to person, place, and time.  Skin: Skin is warm and dry.  Psychiatric: She has a normal mood and affect. Her behavior is normal.     UC Treatments / Results  Labs (all labs ordered are listed, but only abnormal results are displayed) Labs Reviewed  POCT URINALYSIS DIP (DEVICE) - Abnormal; Notable for the following components:      Result Value   Bilirubin Urine SMALL (*)    All other components within normal limits  POCT I-STAT, CHEM 8 - Abnormal; Notable for the following components:   Glucose, Bld 100 (*)    All other components within  normal limits  CBC WITH DIFFERENTIAL/PLATELET  POCT PREGNANCY, URINE    EKG-normal EKG None  Radiology No results found.  Procedures Procedures   Medications Ordered in UC Medications - No data to display  Initial Impression / Assessment and Plan / UC Course  I have reviewed the triage vital signs and the nursing notes.  Pertinent labs & imaging results that were available during my care of the patient were reviewed by me and considered in my medical decision making (see chart for details).    1:55 PM Discussed with patient that with guarding and rebound I had concerns regarding her abdominal pain.  I am uncertain whether I can complete a work-up  at the urgent care center.  She desires for me to do initial screening and send me to the emergency room only if additional imaging is necessary.  Initially will get a Chem-8, CBC, urinalysis, urine pregnancy.  If these do not help with a definitive diagnosis will refer. Her pelvic pain could be PID, ovarian cyst, pregnancy, gastrointestinal, bladder related.  Will follow 1415 p.m. Discussed with patient that all of her tests are negative.  She needs a pelvic ultrasound.  I think the most likely diagnosis is a pelvic etiology,  ovarian cyst being likely with her PCOS.  No significant infection with a normal CBC and white count.  She is offered the choice of an outpatient ultrasound versus visit to the emergency room for more complete testing.  She chooses to wait a couple hours for an ultrasound, and will go to the ER if worse. Final Clinical Impressions(s) / UC Diagnoses   Final diagnoses:  Pelvic pain in female  Pelvic pain     Discharge Instructions     Home today Clear liquids May take some ibuprofen for pain (800 mg)  You are given an appointment for an ultrasound at 4:00.  I will call you with your test results. If you get worse in the meantime with increasing pain, fever, or vomiting you need to go to the emergency  room     ED Prescriptions    None     Controlled Substance Prescriptions [place] Controlled Substance Registry consulted? Not Applicable   Eustace Moore, MD 02/26/18 [phone]

## 2018-02-26 NOTE — ED Triage Notes (Signed)
Pt c/o pelvic and lower back pain.

## 2018-02-26 NOTE — ED Notes (Signed)
EKG given to Dr. Nelson

## 2018-03-30 ENCOUNTER — Telehealth: Payer: 59 | Admitting: Nurse Practitioner

## 2018-03-30 DIAGNOSIS — G43A0 Cyclical vomiting, not intractable: Secondary | ICD-10-CM

## 2018-03-30 DIAGNOSIS — R1115 Cyclical vomiting syndrome unrelated to migraine: Secondary | ICD-10-CM

## 2018-03-30 MED ORDER — ONDANSETRON HCL 4 MG PO TABS: 4.0000 mg | ORAL_TABLET | Freq: Three times a day (TID) | ORAL | 0 refills | Status: DC | PRN

## 2018-03-30 NOTE — Progress Notes (Signed)

## 2018-04-01 DIAGNOSIS — F411 Generalized anxiety disorder: Secondary | ICD-10-CM | POA: Diagnosis not present

## 2018-04-01 MED FILL — ALPRAZolam 0.5 MG TABS: 0.5 | 30 days supply | Qty: 60 | Fill #0

## 2018-04-01 MED FILL — CITALOPRAM HBR 10 MG TABLET: 10 | 90 days supply | Qty: 90 | Fill #0

## 2018-08-09 ENCOUNTER — Telehealth: Payer: 59 | Admitting: Nurse Practitioner

## 2018-08-09 DIAGNOSIS — R05 Cough: Secondary | ICD-10-CM

## 2018-08-09 DIAGNOSIS — R059 Cough, unspecified: Secondary | ICD-10-CM

## 2018-08-09 NOTE — Progress Notes (Signed)

## 2018-09-17 MED FILL — CITALOPRAM HBR 10 MG TABLET: 10 | 90 days supply | Qty: 90 | Fill #1

## 2018-11-10 ENCOUNTER — Telehealth: Payer: Self-pay | Admitting: Nurse Practitioner

## 2018-11-10 DIAGNOSIS — Z20828 Contact with and (suspected) exposure to other viral communicable diseases: Secondary | ICD-10-CM

## 2018-11-10 MED ORDER — OSELTAMIVIR PHOSPHATE 75 MG PO CAPS: 75.0000 mg | ORAL_CAPSULE | Freq: Two times a day (BID) | ORAL | 0 refills | Status: DC

## 2018-11-10 NOTE — Progress Notes (Signed)
E visit for Flu like symptoms   We are sorry that you are not feeling well.  Here is how we plan to help! Based on what you have shared with me it looks like you may have possible exposure to a virus that causes influenza.  Influenza or "the flu" is   an infection caused by a respiratory virus. The flu virus is highly contagious and persons who did not receive their yearly flu vaccination may "catch" the flu from close contact.  We have anti-viral medications to treat the viruses that cause this infection. They are not a "cure" and only shorten the course of the infection. These prescriptions are most effective when they are given within the first 2 days of "flu" symptoms. Antiviral medication are indicated if you have a high risk of complications from the flu. You should  also consider an antiviral medication if you are in close contact with someone who is at risk. These medications can help patients avoid complications from the flu  but have side effects that you should know. Possible side effects from Tamiflu or oseltamivir include nausea, vomiting, diarrhea, dizziness, headaches, eye redness, sleep problems or other respiratory symptoms. You should not take Tamiflu if you have an allergy to oseltamivir or any to the ingredients in Tamiflu.  Based upon your symptoms and potential risk factors I have prescribed Oseltamivir (Tamiflu).  It has been sent to your designated pharmacy.  You will take one 75 mg capsule orally twice a day for the next 5 days.  ANYONE WHO HAS FLU SYMPTOMS SHOULD: . Stay home. The flu is highly contagious and going out or to work exposes others! . Be sure to drink plenty of fluids. Water is fine as well as fruit juices, sodas and electrolyte beverages. You may want to stay away from caffeine or alcohol. If you are nauseated, try taking small sips of liquids. How do you know if you are getting enough fluid? Your urine should be a pale yellow or almost colorless. . Get  rest. . Taking a steamy shower or using a humidifier may help nasal congestion and ease sore throat pain. Using a saline nasal spray works much the same way. . Cough drops, hard candies and sore throat lozenges may ease your cough. . Line up a caregiver. Have someone check on you regularly.   GET HELP RIGHT AWAY IF: . You cannot keep down liquids or your medications. . You become short of breath . Your fell like you are going to pass out or loose consciousness. . Your symptoms persist after you have completed your treatment plan MAKE SURE YOU   Understand these instructions.  Will watch your condition.  Will get help right away if you are not doing well or get worse.  Your e-visit answers were reviewed by a board certified advanced clinical practitioner to complete your personal care plan.  Depending on the condition, your plan could have included both over the counter or prescription medications.  If there is a problem please reply  once you have received a response from your provider.  Your safety is important to us.  If you have drug allergies check your prescription carefully.    You can use MyChart to ask questions about today's visit, request a non-urgent call back, or ask for a work or school excuse for 24 hours related to this e-Visit. If it has been greater than 24 hours you will need to follow up with your provider, or enter a new   e-Visit to address those concerns.  You will get an e-mail in the next two days asking about your experience.  I hope that your e-visit has been valuable and will speed your recovery. Thank you for using e-visits.  5 minutes spent reviewing and documenting in chart.  

## 2019-02-08 ENCOUNTER — Encounter (HOSPITAL_COMMUNITY): Payer: Self-pay | Admitting: Emergency Medicine

## 2019-02-08 ENCOUNTER — Emergency Department (HOSPITAL_COMMUNITY): Payer: BLUE CROSS/BLUE SHIELD

## 2019-02-08 ENCOUNTER — Emergency Department (HOSPITAL_COMMUNITY)
Admission: EM | Admit: 2019-02-08 | Discharge: 2019-02-08 | Disposition: A | Discharge status: Home / Self Care | Payer: BLUE CROSS/BLUE SHIELD | Attending: Emergency Medicine | Admitting: Emergency Medicine

## 2019-02-08 DIAGNOSIS — R0602 Shortness of breath: Secondary | ICD-10-CM | POA: Insufficient documentation

## 2019-02-08 DIAGNOSIS — R0789 Other chest pain: Secondary | ICD-10-CM

## 2019-02-08 DIAGNOSIS — M546 Pain in thoracic spine: Secondary | ICD-10-CM | POA: Insufficient documentation

## 2019-02-08 DIAGNOSIS — Z79899 Other long term (current) drug therapy: Secondary | ICD-10-CM | POA: Insufficient documentation

## 2019-02-08 DIAGNOSIS — Z20828 Contact with and (suspected) exposure to other viral communicable diseases: Secondary | ICD-10-CM | POA: Insufficient documentation

## 2019-02-08 DIAGNOSIS — R6883 Chills (without fever): Secondary | ICD-10-CM | POA: Insufficient documentation

## 2019-02-08 LAB — COMPREHENSIVE METABOLIC PANEL
ALT: 30 U/L (ref 0–44)
AST: 24 U/L (ref 15–41)
Albumin: 3.5 g/dL (ref 3.5–5.0)
Alkaline Phosphatase: 60 U/L (ref 38–126)
Anion gap: 10 (ref 5–15)
BUN: 9 mg/dL (ref 6–20)
CO2: 24 mmol/L (ref 22–32)
Calcium: 9.3 mg/dL (ref 8.9–10.3)
Chloride: 106 mmol/L (ref 98–111)
Creatinine, Ser: 0.6 mg/dL (ref 0.44–1.00)
GFR calc Af Amer: >60 mL/min (ref >60)
GFR calc non Af Amer: >60 mL/min (ref >60)
Glucose, Bld: 88 mg/dL (ref 70–99)
Potassium: 3.9 mmol/L (ref 3.5–5.1)
Sodium: 140 mmol/L (ref 135–145)
Total Bilirubin: 0.5 mg/dL (ref 0.3–1.2)
Total Protein: 7.1 g/dL (ref 6.5–8.1)

## 2019-02-08 LAB — CBC WITH DIFFERENTIAL/PLATELET
Abs Immature Granulocytes: 0.04 10*3/uL (ref 0.00–0.07)
Basophils Absolute: 0 10*3/uL (ref 0.0–0.1)
Basophils Relative: 0 %
Eosinophils Absolute: 0.3 10*3/uL (ref 0.0–0.5)
Eosinophils Relative: 3 %
HCT: 41.6 % (ref 36.0–46.0)
Hemoglobin: 13.6 g/dL (ref 12.0–15.0)
Immature Granulocytes: 0 %
Lymphocytes Relative: 40 %
Lymphs Abs: 4.1 10*3/uL — ABNORMAL HIGH (ref 0.7–4.0)
MCH: 28.3 pg (ref 26.0–34.0)
MCHC: 32.7 g/dL (ref 30.0–36.0)
MCV: 86.7 fL (ref 80.0–100.0)
Monocytes Absolute: 0.6 10*3/uL (ref 0.1–1.0)
Monocytes Relative: 6 %
Neutro Abs: 5.2 10*3/uL (ref 1.7–7.7)
Neutrophils Relative %: 51 %
Platelets: 347 10*3/uL (ref 150–400)
RBC: 4.8 MIL/uL (ref 3.87–5.11)
RDW: 12.4 % (ref 11.5–15.5)
WBC: 10.1 10*3/uL (ref 4.0–10.5)
nRBC: 0 % (ref 0.0–0.2)

## 2019-02-08 LAB — TROPONIN I: Troponin I: <0.03 ng/mL (ref <0.03)

## 2019-02-08 LAB — I-STAT BETA HCG BLOOD, ED (MC, WL, AP ONLY): I-stat hCG, quantitative: <5 m[IU]/mL (ref <5)

## 2019-02-08 LAB — D-DIMER, QUANTITATIVE: D-Dimer, Quant: <0.27 ug/mL-FEU (ref 0.00–0.50)

## 2019-02-08 MED ORDER — KETOROLAC TROMETHAMINE 15 MG/ML IJ SOLN
15.0000 mg | Freq: Once | INTRAMUSCULAR | Status: AC
  Administered 2019-02-08: 15 mg via INTRAVENOUS
  Filled 2019-02-08: qty 1

## 2019-02-08 MED ORDER — SODIUM CHLORIDE 0.9 % IV BOLUS
500.0000 mL | Freq: Once | INTRAVENOUS | Status: AC
  Administered 2019-02-08: 500 mL via INTRAVENOUS

## 2019-02-08 MED ORDER — NAPROXEN 375 MG PO TABS: 375.0000 mg | ORAL_TABLET | Freq: Two times a day (BID) | ORAL | 0 refills | Status: DC

## 2019-02-08 MED ORDER — ACETAMINOPHEN 325 MG PO TABS
650.0000 mg | ORAL_TABLET | Freq: Once | ORAL | Status: AC
  Administered 2019-02-08: 650 mg via ORAL
  Filled 2019-02-08: qty 2

## 2019-02-08 MED ORDER — CYCLOBENZAPRINE HCL 10 MG PO TABS: 10.0000 mg | ORAL_TABLET | Freq: Two times a day (BID) | ORAL | 0 refills | Status: DC | PRN

## 2019-02-08 NOTE — ED Triage Notes (Signed)
Pt states over the last 2-3 days she has felt fatigued with headaches last night she began to have soreness in her chest with upper mid back pain with sob. Pt also reports chills.

## 2019-02-08 NOTE — ED Provider Notes (Signed)
[facility] Provider [facility]   CSN: [id number] Arrival date & time: 02/08/19  [id number]    History   Chief Complaint Chief Complaint  Patient presents with  . Chest Pain  . Chills    HPI Cassandra Ibarra is a 28 y.o. female.     HPI  28 year old female presents with chest pain and shortness of breath.  She states that over the last couple days she has been having some headache and thoracic back pain in the midline.  She states that since yesterday morning she is felt short of breath.  This has progressively worsened.  She also noticed some chest pain starting last night that is across her anterior chest.  She has some left arm numbness and is not sure if this is anxiety as she does have a strong history of anxiety.  She has tried taking her Xanax and PPI without relief.  She had a bowel movement last night and states some of her symptoms improved but then have gotten worse again.  Has not had a cough or sore throat.  She felt hot and cold but has not documented a fever.  She states her left thigh felt a little weird but she has not noticed any leg swelling.  No history of DVT/PE. Pain in chest is currently about an 7. Worse with inspiration, palpation and movement. No known covid-19 contacts.  Past Medical History:  Diagnosis Date  . Depression   . 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 Gastric ulcer   . OCD (obsessive compulsive disorder)   . PCOS (polycystic ovarian syndrome)   . PONV (postoperative nausea and vomiting)     Patient Active Problem List   Diagnosis Date Noted  . PCOS (polycystic ovarian syndrome) 05/18/2015    Past Surgical History:  Procedure Laterality Date  . ESOPHAGOGASTRODUODENOSCOPY (EGD) WITH PROPOFOL N/A 02/09/2014   Procedure: ESOPHAGOGASTRODUODENOSCOPY (EGD) WITH PROPOFOL;  Surgeon: Theda BelfastPatrick D Hung, MD;  [facility];  Service: Endoscopy;  Laterality: N/A;  . WISDOM TOOTH EXTRACTION Left 09/2013      OB History   No obstetric history on file.      Home Medications    Prior to Admission medications   Medication Sig Start Date End Date Taking? Authorizing Provider  cyclobenzaprine (FLEXERIL) 10 MG tablet Take 1 tablet (10 mg total) by mouth 2 (two) times daily as needed for muscle spasms. 02/08/19   Pricilla LovelessGoldston, Krystan Northrop, MD  naproxen (NAPROSYN) 375 MG tablet Take 1 tablet (375 mg total) by mouth 2 (two) times daily. 02/08/19   Pricilla LovelessGoldston, Amarie Tarte, MD  ondansetron (ZOFRAN) 4 MG tablet Take 1 tablet (4 mg total) by mouth every 8 (eight) hours as needed for nausea or vomiting. 03/30/18   Daphine DeutscherMartin, Mary-Margaret, FNP  oseltamivir (TAMIFLU) 75 MG capsule Take 1 capsule (75 mg total) by mouth 2 (two) times daily. 11/10/18   Daphine DeutscherMartin, Mary-Margaret, FNP  Vitamin D, Ergocalciferol, (DRISDOL) 50000 units CAPS capsule Take 50,000 Units by mouth every 7 (seven) days.    [provider]    Family History Family History  Problem Relation Age of Onset  . Arthritis Mother   . Heart disease Father   . Hyperlipidemia Father   . Crohn's disease Neg Hx     Social History Social History   Tobacco Use  . Smoking status: Never Smoker  . Smokeless tobacco: Never Used  Substance Use Topics  . Alcohol use: Yes    Comment: couple times  a month  . Drug use: No     Allergies   Ocella [drospirenone-ethinyl estradiol]; Adhesive [tape]; Lo loestrin fe [norethin ace-eth estrad-fe]; Lorazepam; and Monistat [tioconazole]   Review of Systems Review of Systems  Constitutional: Positive for chills and fever (subjective).  HENT: Negative for sore throat.   Respiratory: Positive for shortness of breath. Negative for cough.   Cardiovascular: Positive for chest pain.  Gastrointestinal: Positive for nausea. Negative for abdominal pain and vomiting.  Musculoskeletal: Positive for back pain.  Neurological: Positive for headaches.  All other systems reviewed and are negative.    Physical Exam Updated Vital  Signs BP 108/69   Pulse 98   Temp 99.3 F (37.4 C) (Oral)   Resp 16   SpO2 98%   Physical Exam Vitals signs and nursing note reviewed.  Constitutional:      Appearance: She is well-developed. She is obese.  HENT:     Head: Normocephalic and atraumatic.     Right Ear: External ear normal.     Left Ear: External ear normal.     Nose: Nose normal.  Eyes:     General:        Right eye: No discharge.        Left eye: No discharge.  Cardiovascular:     Rate and Rhythm: Regular rhythm. Tachycardia present.     Heart sounds: Normal heart sounds.  Pulmonary:     Effort: Pulmonary effort is normal.     Breath sounds: Normal breath sounds.  Chest:     Chest wall: Tenderness (diffuse anterior chest wall tenderness) present.  Abdominal:     Palpations: Abdomen is soft.     Tenderness: There is no abdominal tenderness.  Musculoskeletal:     Comments: Due to obesity, it is difficult to assess or notice and swelling in extremities  Skin:    General: Skin is warm and dry.  Neurological:     Mental Status: She is alert.  Psychiatric:        Mood and Affect: Mood is not anxious.      ED Treatments / Results  Labs (all labs ordered are listed, but only abnormal results are displayed) Labs Reviewed  CBC WITH DIFFERENTIAL/PLATELET - Abnormal; Notable for the following components:      Result Value   Lymphs Abs 4.1 (*)    All other components within normal limits  NOVEL CORONAVIRUS, NAA (HOSPITAL ORDER, SEND-OUT TO REF LAB)  D-DIMER, QUANTITATIVE (NOT AT [facility])  COMPREHENSIVE METABOLIC PANEL  [facility]  I-STAT BETA HCG BLOOD, ED ([facility], [facility], [facility] ONLY)    EKG EKG Interpretation  Date/Time:  Tuesday February 08 2019 10:17:25 EDT Ventricular Rate:  113 PR Interval:    QRS Duration: 84 QT Interval:  320 QTC Calculation: 439 R Axis:   21 Text Interpretation:  Sinus tachycardia Low voltage, precordial leads no significant change since June 2019 Confirmed by Pricilla LovelessGoldston, Onna Nodal [id number]) on  02/08/2019 10:32:55 AM   Radiology Dg Chest Portable 1 View  Result Date: 02/08/2019 CLINICAL DATA:  Chest soreness.  Chest pain and dyspnea. EXAM: PORTABLE CHEST 1 VIEW COMPARISON:  Two-view chest x-ray 12/24/2016 FINDINGS: The heart size is normal. Lung volumes are low. There is no edema or effusion. No focal airspace disease is present. IMPRESSION: 1. Low lung volumes. 2. No acute cardiopulmonary disease. Electronically Signed   By: Marin Robertshristopher  Mattern M.D.   On: 02/08/2019 11:10    Procedures Procedures (including critical care time)  Medications Ordered in  ED Medications  sodium chloride 0.9 % bolus 500 mL (0 mLs Intravenous Stopped 02/08/19 1200)  acetaminophen (TYLENOL) tablet 650 mg (650 mg Oral Given 02/08/19 1142)  ketorolac (TORADOL) 15 MG/ML injection 15 mg (15 mg Intravenous Given 02/08/19 1142)     Initial Impression / Assessment and Plan / ED Course  I have reviewed the triage vital signs and the nursing notes.  Pertinent labs & imaging results that were available during my care of the patient were reviewed by me and considered in my medical decision making (see chart for details).        Work-up is fairly unremarkable.  Patient's pain is a little bit better with NSAIDs and Tylenol.  She states she has had back pain like this before that responded to Flexeril and is asking for a prescription.  I think this is pretty reasonable.  No reports of neuro symptoms.  She worries she is exposed to the coronavirus.  I think an outpatient test is reasonable.  She does not appear ill to require admission for any other reason and with a negative d-dimer I think PE is pretty unlikely.  Probably this is all chest wall inflammation and I will continue to use NSAIDs.  She may use Tylenol but no other NSAIDs.  Follow-up with PCP.  Return precautions.  EQUILLA QUE was evaluated in Emergency Department on 02/08/2019 for the symptoms described in the history of present illness. She was  evaluated in the context of the global COVID-19 pandemic, which necessitated consideration that the patient might be at risk for infection with the SARS-CoV-2 virus that causes COVID-19. Institutional protocols and algorithms that pertain to the evaluation of patients at risk for COVID-19 are in a state of rapid change based on information released by regulatory bodies including the CDC and federal and state organizations. These policies and algorithms were followed during the patient's care in the ED.   Final Clinical Impressions(s) / ED Diagnoses   Final diagnoses:  Chest wall pain  Acute midline thoracic back pain    ED Discharge Orders         Ordered    naproxen (NAPROSYN) 375 MG tablet  2 times daily     02/08/19 1334    cyclobenzaprine (FLEXERIL) 10 MG tablet  2 times daily PRN     02/08/19 1334           Sherwood Gambler, MD 02/08/19 1343

## 2019-02-08 NOTE — Discharge Instructions (Signed)
If you develop worsening, recurrent, or continued back pain, chest pain, abdominal pain, numbness or weakness in the legs, incontinence of your bowels or bladders, numbness of your buttocks, fever, abdominal pain, or any other new/concerning symptoms then return to the ER for evaluation.

## 2019-02-08 NOTE — ED Notes (Signed)
Reviewed lab and xray results with patient.  C/O slight HA only, rated now a "4", back pain resolved.

## 2019-02-09 LAB — NOVEL CORONAVIRUS, NAA (HOSP ORDER, SEND-OUT TO REF LAB; TAT 18-24 HRS): SARS-CoV-2, NAA: NOT DETECTED

## 2019-02-15 ENCOUNTER — Emergency Department (HOSPITAL_COMMUNITY)
Admission: EM | Admit: 2019-02-15 | Discharge: 2019-02-15 | Disposition: A | Discharge status: Home / Self Care | Payer: Self-pay | Attending: Emergency Medicine | Admitting: Emergency Medicine

## 2019-02-15 ENCOUNTER — Encounter (HOSPITAL_COMMUNITY): Payer: Self-pay | Admitting: *Deleted

## 2019-02-15 ENCOUNTER — Emergency Department (HOSPITAL_COMMUNITY): Payer: Self-pay

## 2019-02-15 ENCOUNTER — Other Ambulatory Visit: Payer: Self-pay

## 2019-02-15 DIAGNOSIS — R0789 Other chest pain: Secondary | ICD-10-CM | POA: Insufficient documentation

## 2019-02-15 DIAGNOSIS — Z79899 Other long term (current) drug therapy: Secondary | ICD-10-CM | POA: Insufficient documentation

## 2019-02-15 DIAGNOSIS — R0981 Nasal congestion: Secondary | ICD-10-CM | POA: Insufficient documentation

## 2019-02-15 DIAGNOSIS — R0602 Shortness of breath: Secondary | ICD-10-CM | POA: Insufficient documentation

## 2019-02-15 DIAGNOSIS — R05 Cough: Secondary | ICD-10-CM | POA: Insufficient documentation

## 2019-02-15 DIAGNOSIS — M7918 Myalgia, other site: Secondary | ICD-10-CM | POA: Insufficient documentation

## 2019-02-15 DIAGNOSIS — R197 Diarrhea, unspecified: Secondary | ICD-10-CM | POA: Insufficient documentation

## 2019-02-15 DIAGNOSIS — Z20822 Contact with and (suspected) exposure to covid-19: Secondary | ICD-10-CM

## 2019-02-15 MED ORDER — ACETAMINOPHEN 500 MG PO TABS
1000.0000 mg | ORAL_TABLET | Freq: Once | ORAL | Status: AC
  Administered 2019-02-15: 1000 mg via ORAL
  Filled 2019-02-15: qty 2

## 2019-02-15 MED ORDER — SODIUM CHLORIDE 0.9 % IV BOLUS (SEPSIS)
1000.0000 mL | Freq: Once | INTRAVENOUS | Status: AC
  Administered 2019-02-15: 1000 mL via INTRAVENOUS

## 2019-02-15 MED ORDER — KETOROLAC TROMETHAMINE 30 MG/ML IJ SOLN
30.0000 mg | Freq: Once | INTRAMUSCULAR | Status: AC
  Administered 2019-02-15: 30 mg via INTRAVENOUS
  Filled 2019-02-15: qty 1

## 2019-02-15 NOTE — ED Triage Notes (Addendum)
Pt was seen here a week ago for chest pain, tested negative for covid. Pt contacted our nurse hotline who recommended pt come to ED for further evaluation of covid symptoms, continues to have increased sob and chest burning.  Also reports having a panic attack earlier in the night, took her medication with relief of anxiety

## 2019-02-15 NOTE — ED Notes (Signed)
Pt ambulatory with steady gait; oxygen sats maintained at 96 % (lowest at 94%), did have increased work of breathing, resp rate at 30 (reports this is abnormal for her).

## 2019-02-15 NOTE — Discharge Instructions (Signed)
You may alternate Tylenol 1000 mg every 6 hours as needed for pain and Ibuprofen 800 mg every 8 hours as needed for pain.  Please take Ibuprofen with food.  You may take guaifenesin with dextromethorphan over-the-counter to help with cough which may help with chest pain and chest discomfort.  Your lungs today were clear.  You do not need steroids or albuterol.  Your chest x-ray on the ninth and today showed no sign of viral or bacterial pneumonia.  Your labs on the ninth showed no sign of heart attack or blood clot.  Your vital signs here showed no sign of an oxygen requirement.  You do not need hospitalization.  If your symptoms worsen, please return to the emergency department.  Steps to find a Primary Care Provider (PCP):  Call [phone] or [phone] to access "[place] [facility]."  2.  You may also go on the [facility] website at [email or web address]  3.  [facility] also frequently accepts new patients.  [place] [phone]  4.  There are also multiple [facility], [facility] and [facility] practices throughout the [place] that are frequently accepting new patients. You may find a clinic that is close to your home and contact them.  [facility] [email or web address] [phone]  [facility] [email or web address]  [email or web address] [place] [email or web address] [phone]  5.  Local Health Departments also can provide primary care services.  [facility]  [place] [address] [phone]  [facility] [place] [address] [place] Department [place] [phone]

## 2019-02-15 NOTE — ED Provider Notes (Addendum)
TIME SEEN: 4:33 AM  CHIEF COMPLAINT: Chest pain, shortness of breath  HPI: Patient is a 28 year old female with history of obesity, polycystic ovarian syndrome who presents to the emergency department with complaints of burning chest pain and shortness of breath.  Was seen here on June 9 for similar symptoms.  At that time was tested for coronavirus which came back negative.  States her symptoms have progressively gotten worse.  Pain is worse with coughing.  She is having nasal congestion, body aches, cough, diarrhea.  No nausea or vomiting.  No known sick contacts or recent travel.  No history of PE or DVT.  States she called the on-call nurse hotline who instructed she should come back to the emergency department given her worsening symptoms.  ROS: See HPI Constitutional: no fever  Eyes: no drainage  ENT: no runny nose   Cardiovascular:  chest pain  Resp: SOB  GI: no vomiting GU: no dysuria Integumentary: no rash  Allergy: no hives  Musculoskeletal: no leg swelling  Neurological: no slurred speech ROS otherwise negative  PAST MEDICAL HISTORY/PAST SURGICAL HISTORY:  Past Medical History:  Diagnosis Date  . Depression   . 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 Gastric ulcer   . OCD (obsessive compulsive disorder)   . PCOS (polycystic ovarian syndrome)   . PONV (postoperative nausea and vomiting)     MEDICATIONS:  Prior to Admission medications   Medication Sig Start Date End Date Taking? Authorizing Provider  cyclobenzaprine (FLEXERIL) 10 MG tablet Take 1 tablet (10 mg total) by mouth 2 (two) times daily as needed for muscle spasms. 02/08/19   Sherwood Gambler, MD  naproxen (NAPROSYN) 375 MG tablet Take 1 tablet (375 mg total) by mouth 2 (two) times daily. 02/08/19   Sherwood Gambler, MD  ondansetron (ZOFRAN) 4 MG tablet Take 1 tablet (4 mg total) by mouth every 8 (eight) hours as needed for nausea or vomiting. 03/30/18   Hassell Done, Mary-Margaret, FNP  oseltamivir (TAMIFLU) 75 MG  capsule Take 1 capsule (75 mg total) by mouth 2 (two) times daily. 11/10/18   Hassell Done Mary-Margaret, FNP  Vitamin D, Ergocalciferol, (DRISDOL) 50000 units CAPS capsule Take 50,000 Units by mouth every 7 (seven) days.    [provider]    ALLERGIES:  Allergies  Allergen Reactions  . Ocella [Drospirenone-Ethinyl Estradiol] Shortness Of Breath  . Adhesive [Tape] Itching  . Lo Loestrin Fe [Norethin Ace-Eth Estrad-Fe] Palpitations  . Lorazepam Other (See Comments)    "makes me angry"  . Monistat [Tioconazole] Itching    SOCIAL HISTORY:  Social History   Tobacco Use  . Smoking status: Never Smoker  . Smokeless tobacco: Never Used  Substance Use Topics  . Alcohol use: Yes    Comment: couple times a month    FAMILY HISTORY: Family History  Problem Relation Age of Onset  . Arthritis Mother   . Heart disease Father   . Hyperlipidemia Father   . Crohn's disease Neg Hx     EXAM: BP (!) 155/103   Pulse (!) 114   Temp 99 F (37.2 C) (Oral)   Resp (!) 28   SpO2 96%  CONSTITUTIONAL: Alert and oriented and responds appropriately to questions.  Obese HEAD: Normocephalic EYES: Conjunctivae clear, pupils appear equal, EOMI ENT: normal nose; moist mucous membranes NECK: Supple, no meningismus, no nuchal rigidity, no LAD  CARD: Regular and tachycardic; S1 and S2 appreciated; no murmurs, no clicks, no rubs, no gallops RESP: Normal chest excursion without splinting  intermittently tachypneic; breath sounds clear and equal bilaterally; no wheezes, no rhonchi, no rales, no hypoxia or respiratory distress, speaking full sentences ABD/GI: Normal bowel sounds; non-distended; soft, non-tender, no rebound, no guarding, no peritoneal signs, no hepatosplenomegaly BACK:  The back appears normal and is non-tender to palpation, there is no CVA tenderness EXT: Normal ROM in all joints; non-tender to palpation; no edema; normal capillary refill; no cyanosis, no calf tenderness or swelling     SKIN: Normal color for age and race; warm; no rash NEURO: Moves all extremities equally PSYCH: Appears very anxious.  She is tearful.  MEDICAL DECISION MAKING: Patient here with symptoms of viral illness.  Discussed with patient that this could be coronavirus and that her first test could have been initially a false negative.  Have offered her repeat testing today which she declines.  It appears she had a large work-up during her last ED visit on the ninth.  She had normal labs at that time including a negative troponin and d-dimer.  Her pregnancy test was negative.  Her chest x-ray was clear.  Have offered symptomatic treatment with IV fluids, Toradol.  Will repeat chest x-ray today as well as an EKG.  I think that anxiety is contributing significantly to her symptoms and she agrees.  Her sats are 97% on room air at rest and she does not appear to have any respiratory distress, increased work of breathing.  Will monitor here in the ED.  ED PROGRESS: Patient reports symptoms have improved with Toradol, IV fluids and Tylenol.  She still is tachycardic and mildly tachypneic with walking but again is very anxious.  She has no risk factors for PE other than obesity and she had a negative d-dimer on the ninth.  I have low suspicion that this is a pulmonary embolus today.  I do not think this is ACS or dissection.  I think that this is a viral upper respiratory infection causing her symptoms and could be coronavirus.  Discussed with patient that there is no specific treatment for coronavirus and that she does not need antibiotics at this time.  Discussed with patient that she should alternate Tylenol and ibuprofen at home, rest and increase her fluid intake.  We discussed return precautions.  Patient verbalized understanding.  I feel she is safe to be discharged home.  She has not had any hypoxia or increased work of breathing here.   Of note, patient often has heart rates documented in the 90s to 110s all the  way back into 2013 per our records.  This appears to be baseline for patient.  At this time, I do not feel there is any life-threatening condition present. I have reviewed and discussed all results (EKG, imaging, lab, urine as appropriate) and exam findings with patient/family. I have reviewed nursing notes and appropriate previous records.  I feel the patient is safe to be discharged home without further emergent workup and can continue workup as an outpatient as needed. Discussed usual and customary return precautions. Patient/family verbalize understanding and are comfortable with this plan.  Outpatient follow-up has been provided as needed. All questions have been answered.     EKG Interpretation  Date/Time:  Tuesday February 15 2019 04:21:46 EDT Ventricular Rate:  115 PR Interval:    QRS Duration: 89 QT Interval:  322 QTC Calculation: 446 R Axis:   26 Text Interpretation:  Sinus tachycardia Low voltage, precordial leads Minimal ST depression, lateral leads Confirmed by Rochele RaringWard, Davien Malone [phone]([id number]) on 02/15/2019 5:59:54  AM         Debroah Shuttleworth, Layla MawKristen N, DO 02/15/19 0601    Tisa Weisel, Layla MawKristen N, DO 02/15/19 [id number]

## 2019-06-20 ENCOUNTER — Encounter: Payer: Self-pay | Admitting: Internal Medicine

## 2019-06-20 ENCOUNTER — Other Ambulatory Visit: Payer: Self-pay

## 2019-06-20 ENCOUNTER — Ambulatory Visit: Payer: Self-pay | Attending: Internal Medicine | Admitting: Internal Medicine

## 2019-06-20 VITALS — BP 127/95 | HR 110 | Temp 99.0°F | Resp 16 | Ht 66.5 in | Wt >= 6400 oz

## 2019-06-20 DIAGNOSIS — F419 Anxiety disorder, unspecified: Secondary | ICD-10-CM

## 2019-06-20 DIAGNOSIS — E66813 Obesity, class 3: Secondary | ICD-10-CM

## 2019-06-20 DIAGNOSIS — E282 Polycystic ovarian syndrome: Secondary | ICD-10-CM

## 2019-06-20 DIAGNOSIS — F411 Generalized anxiety disorder: Secondary | ICD-10-CM | POA: Insufficient documentation

## 2019-06-20 DIAGNOSIS — M797 Fibromyalgia: Secondary | ICD-10-CM

## 2019-06-20 DIAGNOSIS — F329 Major depressive disorder, single episode, unspecified: Secondary | ICD-10-CM

## 2019-06-20 DIAGNOSIS — F32A Depression, unspecified: Secondary | ICD-10-CM

## 2019-06-20 HISTORY — DX: Obesity, class 3: E66.813

## 2019-06-20 MED ORDER — MEDROXYPROGESTERONE ACETATE 10 MG PO TABS: ORAL_TABLET | ORAL | 2 refills | Status: DC

## 2019-06-20 MED ORDER — METFORMIN HCL 500 MG PO TABS: ORAL_TABLET | ORAL | 1 refills | Status: DC

## 2019-06-20 MED ORDER — CITALOPRAM HYDROBROMIDE 10 MG PO TABS: 10.0000 mg | ORAL_TABLET | Freq: Every day | ORAL | 2 refills | Status: DC

## 2019-06-20 MED ORDER — PREGABALIN 50 MG PO CAPS: 50.0000 mg | ORAL_CAPSULE | Freq: Two times a day (BID) | ORAL | 2 refills | Status: DC

## 2019-06-20 MED FILL — MEDROXYPROGESTERONE ACETATE: 10 | 30 days supply | Qty: 7 | Fill #0

## 2019-06-20 MED FILL — metFORMIN HCL 500 MG TABS: 500 | 30 days supply | Qty: 60 | Fill #0

## 2019-06-20 MED FILL — CITALOPRAM HBR 10 MG TABLET: 10 | 30 days supply | Qty: 30 | Fill #0

## 2019-06-20 NOTE — Patient Instructions (Signed)

## 2019-06-20 NOTE — Addendum Note (Signed)
Addended by: Karle Plumber B on: 06/20/2019 01:37 PM   Modules accepted: Orders

## 2019-06-20 NOTE — Progress Notes (Signed)
Patient ID: Cassandra Ibarra, female    DOB: Jun 16, 1991  MRN: [id number]  CC: New Patient (Initial Visit)   Subjective: Cassandra Ibarra is a 28 y.o. female who presents for new patient visit Her concerns today include:  Patient with history of obesity, PCOS, dep/anx  Never had PCP.  Was going to gyn for PCOS and primary care issues  Patient gives history of secondary amenorrhea.  Diagnosed with PCOS and was on Metformin.  Would like to get back on it.  Off x 2 yrs.   No menses in over 1 yr.  In last 3 yrs she has had 3-4 periods.   Did have periods while on Metformin In 2017-2018 she was tried with BCP.  Estrogen BCP made her more depressed and anxious.  Afraid to try BCP with estrogen since then  Hx of depression: Reports increased depression recently and would like prescription for Celexa.  She was on Celexa continuously for about a year and a half.  After she began feeling better she took herself off of it.  However she has had recurrence of depression and has been taking some Celexa that she still had intermittently over the past several months.  She is on the 10 mg tablet.  Also reports that she was on Xanax in the past as needed.  She thinks she may need a higher dose of Celexa.  Other concern today is of body pains at the back of the neck, shoulders, chest, hips, knees and lower back.  She experienced pain particularly if she presses on these areas.  She has problems getting a good night sleep.  She feels fatigue and cannot recall the last time she woke up feeling refreshed.  Her wife has told her that she does not snore and has not had any witnessed apnea episodes.  Denies any morning headaches or daytime sleepiness.  For the past 3 months she has been waking up intermittently with bad anxiety followed by generalized body pains.  She is concerned that she may have fibromyalgia.  Her mother and grandmother both had fibromyalgia and have similar symptoms.  Of note she is morbidly  obese.  She admits that she does not get in any exercise out of fear about going outside during the Covid pandemic.  Past medical, surgical, social and family history reviewed.  Patient Active Problem List   Diagnosis Date Noted  . PCOS (polycystic ovarian syndrome) 05/18/2015     Current Outpatient Medications on File Prior to Visit  Medication Sig Dispense Refill  . cyclobenzaprine (FLEXERIL) 10 MG tablet Take 1 tablet (10 mg total) by mouth 2 (two) times daily as needed for muscle spasms. (Patient not taking: Reported on 06/20/2019) 10 tablet 0  . naproxen (NAPROSYN) 375 MG tablet Take 1 tablet (375 mg total) by mouth 2 (two) times daily. 20 tablet 0  . ondansetron (ZOFRAN) 4 MG tablet Take 1 tablet (4 mg total) by mouth every 8 (eight) hours as needed for nausea or vomiting. 20 tablet 0  . oseltamivir (TAMIFLU) 75 MG capsule Take 1 capsule (75 mg total) by mouth 2 (two) times daily. 10 capsule 0  . Vitamin D, Ergocalciferol, (DRISDOL) 50000 units CAPS capsule Take 50,000 Units by mouth every 7 (seven) days.     No current facility-administered medications on file prior to visit.     Allergies  Allergen Reactions  . Ocella [Drospirenone-Ethinyl Estradiol] Shortness Of Breath  . Adhesive [Tape] Itching  . Lo Loestrin Fe [Norethin Ace-Eth  Estrad-Fe] Palpitations  . Lorazepam Other (See Comments)    "makes me angry"  . Monistat [Tioconazole] Itching    Social History   Socioeconomic History  . Marital status: Married    Spouse name: Not on file  . Number of children: Not on file  . Years of education: Not on file  . Highest education level: Not on file  Occupational History    Comment: medicare wellness visits  Social Needs  . Financial resource strain: Not on file  . Food insecurity    Worry: Not on file    Inability: Not on file  . Transportation needs    Medical: Not on file    Non-medical: Not on file  Tobacco Use  . Smoking status: Never Smoker  . Smokeless  tobacco: Never Used  Substance and Sexual Activity  . Alcohol use: Yes    Comment: couple times a month  . Drug use: No  . Sexual activity: Yes    Partners: Male  Lifestyle  . Physical activity    Days per week: Not on file    Minutes per session: Not on file  . Stress: Not on file  Relationships  . Social Musicianconnections    Talks on phone: Not on file    Gets together: Not on file    Attends religious service: Not on file    Active member of club or organization: Not on file    Attends meetings of clubs or organizations: Not on file    Relationship status: Not on file  . Intimate partner violence    Fear of current or ex partner: Not on file    Emotionally abused: Not on file    Physically abused: Not on file    Forced sexual activity: Not on file  Other Topics Concern  . Not on file  Social History Narrative  . Not on file    Family History  Problem Relation Age of Onset  . Arthritis Mother   . Heart disease Father   . Hyperlipidemia Father   . Crohn's disease Neg Hx     Past Surgical History:  Procedure Laterality Date  . ESOPHAGOGASTRODUODENOSCOPY (EGD) WITH PROPOFOL N/A 02/09/2014   Procedure: ESOPHAGOGASTRODUODENOSCOPY (EGD) WITH PROPOFOL;  Surgeon: Theda BelfastPatrick D Hung, MD;  [facility];  Service: Endoscopy;  Laterality: N/A;  . WISDOM TOOTH EXTRACTION Left 09/2013    ROS: Review of Systems Negative except as stated above  PHYSICAL EXAM: BP (!) 127/95   Pulse (!) 110   Temp 99 F (37.2 C) (Oral)   Resp 16   Ht 5' 6.5" (1.689 m)   Wt (!) 428 lb 12.8 oz (194.5 kg)   SpO2 99%   BMI 68.17 kg/m   Wt Readings from Last 3 Encounters:  06/20/19 (!) 428 lb 12.8 oz (194.5 kg)  01/15/18 (!) 430 lb 9.6 oz (195.3 kg)  03/20/17 (!) 414 lb (187.8 kg)    Physical Exam  General appearance - alert, well appearing, morbidly obese young Caucasian female and in no distress Mental status - normal mood, behavior, speech, dress, motor activity, and thought  processes Eyes - pupils equal and reactive, extraocular eye movements intact Neck - supple, no significant adenopathy.no thyroid enlargement.  No thyroid nodules.   Chest - clear to auscultation, no wheezes, rales or rhonchi, symmetric air entry Heart - normal rate, regular rhythm, normal S1, S2, no murmurs, rubs, clicks or gallops Musculoskeletal -patient has point tenderness on palpation at points at the back  of the neck, however both shoulder blades, anterior chest, both sacroiliac areas, medial aspect of both knees Extremities - peripheral pulses normal, no pedal edema, no clubbing or cyanosis   CMP Latest Ref Rng & Units 02/08/2019 02/26/2018 06/06/2015  Glucose 70 - 99 mg/dL 88 132(G) 401(U)  BUN 6 - 20 mg/dL Creatinine 0.44 - 1.00 mg/dL 2.72 5.36 6.44  Sodium 135 - 145 mmol/L 140 139 136  Potassium 3.5 - 5.1 mmol/L 3.9 4.1 3.8  Chloride 98 - 111 mmol/L 106 102 102  CO2 22 - 32 mmol/L 24 - 26  Calcium 8.9 - 10.3 mg/dL 9.3 - 9.1  Total Protein 6.5 - 8.1 g/dL 7.1 - -  Total Bilirubin 0.3 - 1.2 mg/dL 0.5 - -  Alkaline Phos 38 - 126 U/L 60 - -  AST 15 - 41 U/L 24 - -  ALT 0 - 44 U/L 30 - -   Lipid Panel     Component Value Date/Time   CHOL 158 05/18/2015 0906   TRIG 133.0 05/18/2015 0906   HDL 36.00 (L) 05/18/2015 0906   CHOLHDL 4 05/18/2015 0906   VLDL 26.6 05/18/2015 0906   LDLCALC 96 05/18/2015 0906    CBC    Component Value Date/Time   WBC 10.1 02/08/2019 1137   RBC 4.80 02/08/2019 1137   HGB 13.6 02/08/2019 1137   HCT 41.6 02/08/2019 1137   PLT 347 02/08/2019 1137   MCV 86.7 02/08/2019 1137   MCV 89.1 08/01/2012 1641   MCH 28.3 02/08/2019 1137   MCHC 32.7 02/08/2019 1137   RDW 12.4 02/08/2019 1137   LYMPHSABS 4.1 (H) 02/08/2019 1137   MONOABS 0.6 02/08/2019 1137   EOSABS 0.3 02/08/2019 1137   BASOSABS 0.0 02/08/2019 1137    ASSESSMENT AND PLAN: 1. PCOS (polycystic ovarian syndrome) We will put her back on Metformin.  I also gave her the option of  trying Provera for the first 7 days of every month to see if it could bring about some withdrawal bleeding consistently.  Patient wanted to try both the Metformin and Provera.  Advised that sometimes Provera can cause heavy bleeding. - metFORMIN (GLUCOPHAGE) 500 MG tablet; 1 tab PO daily x 1 wk then BID  Dispense: 60 tablet; Refill: 1 - medroxyPROGESTERone (PROVERA) 10 MG tablet; 1 tab Po daily for 1st 7 days of each month.  Dispense: 14 tablet; Refill: 2  2. Fibromyalgia Likely has fibromyalgia.  We discussed putting her on Cymbalta which is used for fibromyalgia and will also help with anxiety and depression.  Patient declined the Cymbalta stating that she had a cousin that had a bad reaction to it.  She is willing to try Lyrica instead. Discussed and encourage graded exercise starting at only 5 minutes daily.  Try to increase gradually over time to 30 minutes 3-4 times a week. - pregabalin (LYRICA) 50 MG capsule; Take 1 capsule (50 mg total) by mouth 2 (two) times daily.  Dispense: 60 capsule; Refill: 2  3. Anxiety and depression Patient would like to continue with Celexa.  I recommend that we continue with the 10 mg dose and have her take it consistently for 4 to 6 weeks and then reevaluate whether she needs a higher dose since she has not been taking the 10 mg consistently - TSH  4. Morbid obesity (HCC) Discussed the importance of healthy eating habits and regular exercise.  See exercise counseling given in #2 above.  Printed information given on healthy eating  habits.   About 30 minutes spent with this patient in direct face-to-face evaluation discussing diagnosis and treatment and coordinating care. Patient was given the opportunity to ask questions.  Patient verbalized understanding of the plan and was able to repeat key elements of the plan.   No orders of the defined types were placed in this encounter.    Requested Prescriptions    No prescriptions requested or ordered in this  encounter    No follow-ups on file.  Jonah Blue, MD, FACP

## 2019-06-21 LAB — TSH: TSH: 5.11 u[IU]/mL — ABNORMAL HIGH (ref 0.450–4.500)

## 2019-07-01 MED FILL — PREGABALIN 50 MG CAPS: 50 | 30 days supply | Qty: 60 | Fill #0

## 2019-07-01 MED FILL — CITALOPRAM HBR 10 MG TABLET: 10 | 30 days supply | Qty: 30 | Fill #0

## 2019-07-01 MED FILL — MEDROXYPROGESTERONE ACETATE: 10 | 30 days supply | Qty: 7 | Fill #0

## 2019-07-01 MED FILL — metFORMIN HCL 500 MG TABS: 500 | 30 days supply | Qty: 60 | Fill #0

## 2019-07-29 ENCOUNTER — Telehealth: Payer: Self-pay | Admitting: Family

## 2019-07-29 DIAGNOSIS — R059 Cough, unspecified: Secondary | ICD-10-CM

## 2019-07-29 DIAGNOSIS — R05 Cough: Secondary | ICD-10-CM

## 2019-07-29 MED ORDER — PREDNISONE 20 MG PO TABS: 20.0000 mg | ORAL_TABLET | Freq: Every day | ORAL | 0 refills | Status: DC

## 2019-07-29 MED ORDER — ALBUTEROL SULFATE HFA 108 (90 BASE) MCG/ACT IN AERS: 2.0000 | INHALATION_SPRAY | Freq: Four times a day (QID) | RESPIRATORY_TRACT | 0 refills | Status: DC | PRN

## 2019-07-29 NOTE — Progress Notes (Signed)
We are sorry that you are not feeling well.  Here is how we plan to help!  Based on your presentation I believe you most likely have A cough due to a virus.  This is called viral bronchitis and is best treated by rest, plenty of fluids and control of the cough.  You may use Ibuprofen or Tylenol as directed to help your symptoms.     In addition you may use A non-prescription cough medication called Mucinex DM: take 2 tablets every 12 hours.  Albuterol inhaler 2 puffs every 4-6 hours as needed.   From your responses in the eVisit questionnaire you describe inflammation in the upper respiratory tract which is causing a significant cough.  This is commonly called Bronchitis and has four common causes:    Allergies  Viral Infections  Acid Reflux  Bacterial Infection Allergies, viruses and acid reflux are treated by controlling symptoms or eliminating the cause. An example might be a cough caused by taking certain blood pressure medications. You stop the cough by changing the medication. Another example might be a cough caused by acid reflux. Controlling the reflux helps control the cough.  USE OF BRONCHODILATOR ("RESCUE") INHALERS: There is a risk from using your bronchodilator too frequently.  The risk is that over-reliance on a medication which only relaxes the muscles surrounding the breathing tubes can reduce the effectiveness of medications prescribed to reduce swelling and congestion of the tubes themselves.  Although you feel brief relief from the bronchodilator inhaler, your asthma may actually be worsening with the tubes becoming more swollen and filled with mucus.  This can delay other crucial treatments, such as oral steroid medications. If you need to use a bronchodilator inhaler daily, several times per day, you should discuss this with your provider.  There are probably better treatments that could be used to keep your asthma under control.     HOME CARE . Only take medications as  instructed by your medical team. . Complete the entire course of an antibiotic. . Drink plenty of fluids and get plenty of rest. . Avoid close contacts especially the very young and the elderly . Cover your mouth if you cough or cough into your sleeve. . Always remember to wash your hands . A steam or ultrasonic humidifier can help congestion.   GET HELP RIGHT AWAY IF: . You develop worsening fever. . You become short of breath . You cough up blood. . Your symptoms persist after you have completed your treatment plan MAKE SURE YOU   Understand these instructions.  Will watch your condition.  Will get help right away if you are not doing well or get worse.  Your e-visit answers were reviewed by a board certified advanced clinical practitioner to complete your personal care plan.  Depending on the condition, your plan could have included both over the counter or prescription medications. If there is a problem please reply  once you have received a response from your provider. Your safety is important to [place].  If you have drug allergies check your prescription carefully.    You can use MyChart to ask questions about today's visit, request a non-urgent call back, or ask for a work or school excuse for 24 hours related to this e-Visit. If it has been greater than 24 hours you will need to follow up with your provider, or enter a new e-Visit to address those concerns. You will get an e-mail in the next two days asking about your experience.  I hope that your e-visit has been valuable and will speed your recovery. Thank you for using e-visits.  Greater than 5 minutes, yet less than 10 minutes of time have been spent researching, coordinating, and implementing care for this patient today.  Thank you for the details you included in the comment boxes. Those details are very helpful in determining the best course of treatment for you and help [place] to provide the best care.

## 2019-07-29 NOTE — Addendum Note (Signed)
Addended by: Dutch Quint B on: 07/29/2019 06:58 PM   Modules accepted: Orders

## 2019-08-05 ENCOUNTER — Other Ambulatory Visit: Payer: Self-pay

## 2019-08-05 ENCOUNTER — Ambulatory Visit: Payer: Self-pay | Attending: Internal Medicine | Admitting: Internal Medicine

## 2019-12-23 ENCOUNTER — Telehealth: Payer: Self-pay | Admitting: Nurse Practitioner

## 2019-12-23 DIAGNOSIS — R110 Nausea: Secondary | ICD-10-CM

## 2019-12-23 MED ORDER — ONDANSETRON HCL 4 MG PO TABS: 4.0000 mg | ORAL_TABLET | Freq: Three times a day (TID) | ORAL | 0 refills | Status: DC | PRN

## 2019-12-23 NOTE — Progress Notes (Signed)

## 2020-02-17 ENCOUNTER — Ambulatory Visit: Payer: Self-pay | Admitting: Internal Medicine

## 2020-03-02 ENCOUNTER — Ambulatory Visit: Payer: Self-pay | Admitting: Internal Medicine

## 2020-06-11 ENCOUNTER — Telehealth: Payer: Self-pay | Admitting: Family

## 2020-06-11 DIAGNOSIS — R0602 Shortness of breath: Secondary | ICD-10-CM

## 2020-06-11 DIAGNOSIS — Z20822 Contact with and (suspected) exposure to covid-19: Secondary | ICD-10-CM

## 2020-06-11 NOTE — Progress Notes (Signed)
Based on what you shared with me, I feel your condition warrants further evaluation and I recommend that you be seen for a face to face office visit.  Given your shortness of breath, you need to be seen face to face today at a Urgent Care.    NOTE: If you entered your credit card information for this eVisit, you will not be charged. You may see a "hold" on your card for the $35 but that hold will drop off and you will not have a charge processed.   If you are having a true medical emergency please call 911.      For an urgent face to face visit, [place] has five urgent care centers for your convenience:      NEW:  [facility] at [facility] Directions [phone] [address] [place], [place] [address] . 10 am - 6pm Monday - Friday    [facility]) Get Driving Directions [phone] [address] [place], [place] [address] . 10 am to 8 pm Monday-Friday . 12 pm to 8 pm [facility] at [facility] Get Driving Directions [phone] [address], [address] [place], [place] [address] . 8 am to 8 pm Monday-Friday . 9 am to 6 pm Saturday . 11 am to 6 pm Sunday     [facility] at [facility] Get Driving Directions  [phone] [address] [place], [place] [address] . 8 am to 8 pm Monday-Friday . 8 am to 4 pm [facility] Urgent Care at [facility] Directions [phone] [address] [place], [place] [address] . 12 pm to 6 pm Monday-Friday      Your e-visit answers were reviewed by a board certified advanced clinical practitioner to complete your personal care plan.  Thank you for using e-Visits.

## 2020-08-08 ENCOUNTER — Telehealth: Payer: Self-pay | Admitting: Family

## 2020-08-08 DIAGNOSIS — Q899 Congenital malformation, unspecified: Secondary | ICD-10-CM

## 2020-08-08 MED ORDER — MUPIROCIN CALCIUM 2 % EX CREA: 1.0000 "application " | TOPICAL_CREAM | Freq: Two times a day (BID) | CUTANEOUS | 0 refills | Status: DC

## 2020-08-08 NOTE — Progress Notes (Signed)
E Visit for Cellulitis  We are sorry that you are not feeling well. Here is how we plan to help!  Based on what you shared with me it looks like you have cellulitis.  Cellulitis looks like areas of skin redness, swelling, and warmth; it develops as a result of bacteria entering under the skin. Little red spots and/or bleeding can be seen in skin, and tiny surface sacs containing fluid can occur. Fever can be present. Cellulitis is almost always on one side of a body, and the lower limbs are the most common site of involvement.   I have prescribed:  Bactroban 2% cream that you will apply twice a day. Keep clean and dry.   HOME CARE:  . Take your medications as ordered and take all of them, even if the skin irritation appears to be healing.   GET HELP RIGHT AWAY IF:  . Symptoms that don't begin to go away within 48 hours. . Severe redness persists or worsens . If the area turns color, spreads or swells. . If it blisters and opens, develops yellow-brown crust or bleeds. . You develop a fever or chills. . If the pain increases or becomes unbearable.  . Are unable to keep fluids and food down.  MAKE SURE YOU    Understand these instructions.  Will watch your condition.  Will get help right away if you are not doing well or get worse.  Thank you for choosing an e-visit. Your e-visit answers were reviewed by a board certified advanced clinical practitioner to complete your personal care plan. Depending upon the condition, your plan could have included both over the counter or prescription medications. Please review your pharmacy choice. Make sure the pharmacy is open so you can pick up prescription now. If there is a problem, you may contact your provider through [employer] and have the prescription routed to another pharmacy. Your safety is important to [place]. If you have drug allergies check your prescription carefully.  For the next 24 hours you can use MyChart to ask questions  about today's visit, request a non-urgent call back, or ask for a work or school excuse. You will get an email in the next two days asking about your experience. I hope that your e-visit has been valuable and will speed your recovery.  Approximately 5 minutes was spent documenting and reviewing patient's chart.

## 2020-10-25 ENCOUNTER — Telehealth: Payer: Self-pay | Admitting: Emergency Medicine

## 2020-10-25 DIAGNOSIS — R059 Cough, unspecified: Secondary | ICD-10-CM

## 2020-10-25 DIAGNOSIS — R062 Wheezing: Secondary | ICD-10-CM

## 2020-10-25 NOTE — Progress Notes (Signed)
Based on what you shared with me, I feel your condition warrants further evaluation and I recommend that you be seen for a face to face office visit for a lung exam and possible chest x-ray.    NOTE: If you entered your credit card information for this eVisit, you will not be charged. You may see a "hold" on your card for the $35 but that hold will drop off and you will not have a charge processed.   If you are having a true medical emergency please call 911.      For an urgent face to face visit, [facility] has five urgent care centers for your convenience:     [facility] at [facility] Directions [phone] [address] [place], [place] [address] . 10 am - 6pm Monday - Friday    [facility]) Get Driving Directions [phone] [address] [place], [place] [address] . 10 am to 8 pm Monday-Friday . 12 pm to 8 pm [facility] Urgent Care at [facility] Get Driving Directions [phone] [address] [place], [place] [address] . 8 am to 8 pm Monday-Friday . 9 am to 6 pm Saturday . 11 am to 6 pm Sunday     [facility] Care at [facility] Get Driving Directions  [phone] [address] [place], [place] [address] . 8 am to 8 pm Monday-Friday . 8 am to 4 pm [facility] Urgent Care at [facility] Directions [phone] [address] [place], [place] [address] . 12 pm to 6 pm Monday-Friday      Your e-visit answers were reviewed by a board certified advanced clinical practitioner to complete your personal care plan.  Thank you for using e-Visits.   Approximately 5 minutes was spent documenting and reviewing patient's chart.

## 2020-12-25 DIAGNOSIS — K219 Gastro-esophageal reflux disease without esophagitis: Secondary | ICD-10-CM | POA: Insufficient documentation

## 2020-12-27 DIAGNOSIS — F331 Major depressive disorder, recurrent, moderate: Secondary | ICD-10-CM | POA: Insufficient documentation

## 2021-02-15 DIAGNOSIS — F900 Attention-deficit hyperactivity disorder, predominantly inattentive type: Secondary | ICD-10-CM | POA: Insufficient documentation

## 2021-09-19 ENCOUNTER — Telehealth: Payer: Self-pay | Admitting: Physician Assistant

## 2021-09-19 DIAGNOSIS — J208 Acute bronchitis due to other specified organisms: Secondary | ICD-10-CM

## 2021-09-19 DIAGNOSIS — B9689 Other specified bacterial agents as the cause of diseases classified elsewhere: Secondary | ICD-10-CM

## 2021-09-19 MED ORDER — DOXYCYCLINE HYCLATE 100 MG PO TABS: 100.0000 mg | ORAL_TABLET | Freq: Two times a day (BID) | ORAL | 0 refills | Status: DC

## 2021-09-19 MED ORDER — BENZONATATE 100 MG PO CAPS: 100.0000 mg | ORAL_CAPSULE | Freq: Three times a day (TID) | ORAL | 0 refills | Status: DC | PRN

## 2021-09-19 MED ORDER — PREDNISONE 20 MG PO TABS
40.0000 mg | ORAL_TABLET | Freq: Every day | ORAL | 0 refills | Status: DC
Start: 2021-09-19 — End: 2022-01-26

## 2021-09-19 NOTE — Progress Notes (Signed)

## 2021-09-19 NOTE — Progress Notes (Signed)
I have spent 5 minutes in review of e-visit questionnaire, review and updating patient chart, medical decision making and response to patient.   Rivaan Kendall Cody Rhyann Berton, PA-C    

## 2021-11-07 ENCOUNTER — Telehealth: Payer: Self-pay | Admitting: Family Medicine

## 2021-11-07 ENCOUNTER — Encounter: Payer: Self-pay | Admitting: Family Medicine

## 2021-11-07 DIAGNOSIS — R6889 Other general symptoms and signs: Secondary | ICD-10-CM

## 2021-11-07 NOTE — Progress Notes (Signed)
E visit for Flu like symptoms   We are sorry that you are not feeling well.  Here is how we plan to help! Based on what you have shared with me it looks like you may have flu-like symptoms that should be watched but do not seem to indicate anti-viral treatment.  Influenza or "the flu" is   an infection caused by a respiratory virus. The flu virus is highly contagious and persons who did not receive their yearly flu vaccination may "catch" the flu from close contact.  We have anti-viral medications to treat the viruses that cause this infection. They are not a "cure" and only shorten the course of the infection. These prescriptions are most effective when they are given within the first 2 days of "flu" symptoms. Antiviral medication are indicated if you have a high risk of complications from the flu. You should  also consider an antiviral medication if you are in close contact with someone who is at risk. These medications can help patients avoid complications from the flu  but have side effects that you should know. Possible side effects from Tamiflu or oseltamivir include nausea, vomiting, diarrhea, dizziness, headaches, eye redness, sleep problems or other respiratory symptoms. You should not take Tamiflu if you have an allergy to oseltamivir or any to the ingredients in Tamiflu.  Based upon your symptoms and potential risk factors I recommend that you follow the flu symptoms recommendation that I have listed below.  ANYONE WHO HAS FLU SYMPTOMS SHOULD: Stay home. The flu is highly contagious and going out or to work exposes others! Be sure to drink plenty of fluids. Water is fine as well as fruit juices, sodas and electrolyte beverages. You may want to stay away from caffeine or alcohol. If you are nauseated, try taking small sips of liquids. How do you know if you are getting enough fluid? Your urine should be a pale yellow or almost colorless. Get rest. Taking a steamy shower or using a  humidifier may help nasal congestion and ease sore throat pain. Using a saline nasal spray works much the same way. Cough drops, hard candies and sore throat lozenges may ease your cough. Line up a caregiver. Have someone check on you regularly.   GET HELP RIGHT AWAY IF: You cannot keep down liquids or your medications. You become short of breath Your fell like you are going to pass out or loose consciousness. Your symptoms persist after you have completed your treatment plan MAKE SURE YOU  Understand these instructions. Will watch your condition. Will get help right away if you are not doing well or get worse.  Your e-visit answers were reviewed by a board certified advanced clinical practitioner to complete your personal care plan.  Depending on the condition, your plan could have included both over the counter or prescription medications.  If there is a problem please reply  once you have received a response from your provider.  Your safety is important to us.  If you have drug allergies check your prescription carefully.    You can use MyChart to ask questions about today's visit, request a non-urgent call back, or ask for a work or school excuse for 24 hours related to this e-Visit. If it has been greater than 24 hours you will need to follow up with your provider, or enter a new e-Visit to address those concerns.  You will get an e-mail in the next two days asking about your experience.  I hope that   your e-visit has been valuable and will speed your recovery. Thank you for using e-visits.  I provided 5 minutes of non face-to-face time during this encounter for chart review, medication and order placement, as well as and documentation.   

## 2022-01-09 ENCOUNTER — Telehealth: Payer: Self-pay | Admitting: Physician Assistant

## 2022-01-09 DIAGNOSIS — K219 Gastro-esophageal reflux disease without esophagitis: Secondary | ICD-10-CM

## 2022-01-09 MED ORDER — OMEPRAZOLE 20 MG PO CPDR: 20.0000 mg | DELAYED_RELEASE_CAPSULE | Freq: Every day | ORAL | 0 refills | Status: DC

## 2022-01-09 MED ORDER — ONDANSETRON 4 MG PO TBDP: 4.0000 mg | ORAL_TABLET | Freq: Three times a day (TID) | ORAL | 0 refills | Status: DC | PRN

## 2022-01-09 NOTE — Progress Notes (Signed)
E-Visit for Vomiting ? ?We are sorry that you are not feeling well. Here is how we plan to help! ? ?Based on what you have shared with me it looks like you have a Virus that is irritating your GI tract.  Vomiting is the forceful emptying of a portion of the stomach's content through the mouth.  Although nausea and vomiting can make you feel miserable, it's important to remember that these are not diseases, but rather symptoms of an underlying illness.  When we treat short term symptoms, we always caution that any symptoms that persist should be fully evaluated in a medical office. ? ?I have prescribed a medication that will help alleviate your symptoms and allow you to stay hydrated: ? ?Zofran 4 mg 1 tablet every 8 hours as needed for nausea and vomiting ?I have also sent in Omeprazole to reduce acid production in the stomach so things can heal up.  ?HOME CARE: ?Drink clear liquids.  This is very important! Dehydration (the lack of fluid) can lead to a serious complication.  Start off with 1 tablespoon every 5 minutes for 8 hours. ?You may begin eating bland foods after 8 hours without vomiting.  Start with saltine crackers, white bread, rice, mashed potatoes, applesauce. ?After 48 hours on a bland diet, you may resume a normal diet. ?Try to go to sleep.  Sleep often empties the stomach and relieves the need to vomit. ? ?GET HELP RIGHT AWAY IF: ? ?Your symptoms do not improve or worsen within 2 days after treatment. ?You have a fever for over 3 days. ?You cannot keep down fluids after trying the medication. ? ?MAKE SURE YOU: ? ?Understand these instructions. ?Will watch your condition. ?Will get help right away if you are not doing well or get worse. ? ? ?Thank you for choosing an e-visit. ? ?Your e-visit answers were reviewed by a board certified advanced clinical practitioner to complete your personal care plan. Depending upon the condition, your plan could have included both over the counter or prescription  medications. ? ?Please review your pharmacy choice. Make sure the pharmacy is open so you can pick up prescription now. If there is a problem, you may contact your provider through [employer] and have the prescription routed to another pharmacy.  Your safety is important to [place]. If you have drug allergies check your prescription carefully.  ? ?For the next 24 hours you can use MyChart to ask questions about today's visit, request a non-urgent call back, or ask for a work or school excuse. ?You will get an email in the next two days asking about your experience. I hope that your e-visit has been valuable and will speed your recovery. ? ?

## 2022-01-09 NOTE — Progress Notes (Signed)
I have spent 5 minutes in review of e-visit questionnaire, review and updating patient chart, medical decision making and response to patient.   Jazmynn Pho Cody Frieda Arnall, PA-C    

## 2022-01-24 ENCOUNTER — Telehealth: Payer: Self-pay | Admitting: Emergency Medicine

## 2022-01-24 DIAGNOSIS — H9201 Otalgia, right ear: Secondary | ICD-10-CM

## 2022-01-24 NOTE — Progress Notes (Signed)
Based on what you shared with me, I feel your condition warrants further evaluation and I recommend that you be seen in a face to face visit. Because of your ear pain, you'll need to have your ear examined, and this cannot be done through an evisit. You can try to see your primary care provider or go to an urgent care. [facility] locations are listed below.    NOTE: There will be NO CHARGE for this eVisit   If you are having a true medical emergency please call 911.      For an urgent face to face visit, [facility] has six urgent care centers for your convenience:     [facility] Center at [facility] Directions [phone] [address] [place], [place] [address]    [facility] Health Urgent Care Center [facility]) Get Driving Directions [phone] [address] [place], [place] [address]  [facility] Urgent Care Center [facility]) Get Driving Directions [phone] [address] [place],  [place]  [address]  [facility] Urgent Care at [facility] Get Driving Directions [phone] [address] [place], [place] [address]   [facility] Urgent Care at [facility] Get Driving Directions  [phone] [address] [place], [place] [address]   [facility] Health Urgent Care at [facility] Directions [phone] [address] [place], [place] [address]  Your MyChart E-visit questionnaire answers were reviewed by a board certified advanced clinical practitioner to complete your personal care plan based on your specific symptoms.  Thank you for using e-Visits.

## 2022-01-26 ENCOUNTER — Other Ambulatory Visit: Payer: Self-pay | Admitting: Nurse Practitioner

## 2022-01-26 ENCOUNTER — Telehealth: Payer: Self-pay | Admitting: Nurse Practitioner

## 2022-01-26 DIAGNOSIS — B9689 Other specified bacterial agents as the cause of diseases classified elsewhere: Secondary | ICD-10-CM

## 2022-01-26 DIAGNOSIS — J019 Acute sinusitis, unspecified: Secondary | ICD-10-CM

## 2022-01-26 MED ORDER — PREDNISONE 20 MG PO TABS: 40.0000 mg | ORAL_TABLET | Freq: Every day | ORAL | 0 refills | Status: AC

## 2022-01-26 MED ORDER — AZELASTINE HCL 0.1 % NA SOLN: 2.0000 | Freq: Two times a day (BID) | NASAL | 0 refills | Status: DC

## 2022-01-26 MED ORDER — AMOXICILLIN-POT CLAVULANATE 875-125 MG PO TABS: 1.0000 | ORAL_TABLET | Freq: Two times a day (BID) | ORAL | 0 refills | Status: AC

## 2022-01-26 NOTE — Progress Notes (Signed)
Virtual Visit Consent   Cassandra Ibarra, you are scheduled for a virtual visit with a [facility] provider today. Just as with appointments in the office, your consent must be obtained to participate. Your consent will be active for this visit and any virtual visit you may have with one of our providers in the next 365 days. If you have a MyChart account, a copy of this consent can be sent to you electronically.  As this is a virtual visit, video technology does not allow for your provider to perform a traditional examination. This may limit your provider's ability to fully assess your condition. If your provider identifies any concerns that need to be evaluated in person or the need to arrange testing (such as labs, EKG, etc.), we will make arrangements to do so. Although advances in technology are sophisticated, we cannot ensure that it will always work on either your end or our end. If the connection with a video visit is poor, the visit may have to be switched to a telephone visit. With either a video or telephone visit, we are not always able to ensure that we have a secure connection.  By engaging in this virtual visit, you consent to the provision of healthcare and authorize for your insurance to be billed (if applicable) for the services provided during this visit. Depending on your insurance coverage, you may receive a charge related to this service.  I need to obtain your verbal consent now. Are you willing to proceed with your visit today? Raynald Kemp has provided verbal consent on 01/26/2022 for a virtual visit (video or telephone). Claiborne Rigg, NP  Date: 01/26/2022 12:56 PM  Virtual Visit via Video Note   I, Claiborne Rigg, connected with  [name]  ([id number], Apr 19, 1991) on 01/26/22 at 12:30 PM EDT by a video-enabled telemedicine application and verified that I am speaking with the correct person using two identifiers.  Location: Patient: Virtual Visit Location  Patient: Home Provider: Virtual Visit Location Provider: Home Office   I discussed the limitations of evaluation and management by telemedicine and the availability of in person appointments. The patient expressed understanding and agreed to proceed.    History of Present Illness: Cassandra Ibarra is a 31 y.o. who identifies as a female who was assigned female at birth, and is being seen today for bacterial sinusitis.  Onset of symptoms over 1 week ago with symptoms waxing and waning since then. Her symptoms include nasal congestion, clear rhinorrhea, cough, sneezing, bilateral ear pressure, fever, post nasal drip, myalgias.   Little relief with taking Tylenol cold and sinus.    Problems:  Patient Active Problem List   Diagnosis Date Noted   Fibromyalgia 06/20/2019   Anxiety and depression 06/20/2019   Morbid obesity (HCC) 06/20/2019   PCOS (polycystic ovarian syndrome) 05/18/2015    Allergies:  Allergies  Allergen Reactions   Ocella [Drospirenone-Ethinyl Estradiol] Shortness Of Breath   Adhesive [Tape] Itching   Lo Loestrin Fe [Norethin Ace-Eth Estrad-Fe] Palpitations   Lorazepam Other (See Comments)    "makes me angry"   Monistat [Tioconazole] Itching   Medications:  Current Outpatient Medications:    amoxicillin-clavulanate (AUGMENTIN) 875-125 MG tablet, Take 1 tablet by mouth 2 (two) times daily for 7 days., Disp: 14 tablet, Rfl: 0   azelastine (ASTELIN) 0.1 % nasal spray, Place 2 sprays into both nostrils 2 (two) times daily. Use in each nostril as directed, Disp: 30 mL, Rfl: 0   albuterol (  VENTOLIN HFA) 108 (90 Base) MCG/ACT inhaler, Inhale 2 puffs into the lungs every 6 (six) hours as needed for wheezing or shortness of breath., Disp: 16 g, Rfl: 0   benzonatate (TESSALON) 100 MG capsule, Take 1 capsule (100 mg total) by mouth 3 (three) times daily as needed for cough., Disp: 30 capsule, Rfl: 0   citalopram (CELEXA) 10 MG tablet, Take 1 tablet (10 mg total) by mouth daily.,  Disp: 30 tablet, Rfl: 2   medroxyPROGESTERone (PROVERA) 10 MG tablet, 1 tab Po daily for 1st 7 days of each month., Disp: 14 tablet, Rfl: 2   metFORMIN (GLUCOPHAGE) 500 MG tablet, 1 tab PO daily x 1 wk then BID, Disp: 60 tablet, Rfl: 1   mupirocin cream (BACTROBAN) 2 %, Apply 1 application topically 2 (two) times daily., Disp: 30 g, Rfl: 0   omeprazole (PRILOSEC) 20 MG capsule, Take 1 capsule (20 mg total) by mouth daily., Disp: 30 capsule, Rfl: 0   ondansetron (ZOFRAN-ODT) 4 MG disintegrating tablet, Take 1 tablet (4 mg total) by mouth every 8 (eight) hours as needed for nausea or vomiting., Disp: 20 tablet, Rfl: 0   predniSONE (DELTASONE) 20 MG tablet, Take 2 tablets (40 mg total) by mouth daily with breakfast for 5 days., Disp: 10 tablet, Rfl: 0   pregabalin (LYRICA) 50 MG capsule, Take 1 capsule (50 mg total) by mouth 2 (two) times daily., Disp: 60 capsule, Rfl: 2   Vitamin D, Ergocalciferol, (DRISDOL) 50000 units CAPS capsule, Take 50,000 Units by mouth every 7 (seven) days., Disp: , Rfl:   Observations/Objective: Patient is well-developed, well-nourished in no acute distress.  Resting comfortably at home.  Head is normocephalic, atraumatic.  No labored breathing.  Speech is clear and coherent with logical content.  Patient is alert and oriented at baseline.    Assessment and Plan: 1. Acute bacterial sinusitis - amoxicillin-clavulanate (AUGMENTIN) 875-125 MG tablet; Take 1 tablet by mouth 2 (two) times daily for 7 days.  Dispense: 14 tablet; Refill: 0 - azelastine (ASTELIN) 0.1 % nasal spray; Place 2 sprays into both nostrils 2 (two) times daily. Use in each nostril as directed  Dispense: 30 mL; Refill: 0 - predniSONE (DELTASONE) 20 MG tablet; Take 2 tablets (40 mg total) by mouth daily with breakfast for 5 days.  Dispense: 10 tablet; Refill: 0  INSTRUCTIONS: use a humidifier for nasal congestion Drink plenty of fluids, rest and wash hands frequently to avoid the spread of  infection Alternate tylenol and Motrin for relief of fever   Follow Up Instructions: I discussed the assessment and treatment plan with the patient. The patient was provided an opportunity to ask questions and all were answered. The patient agreed with the plan and demonstrated an understanding of the instructions.  A copy of instructions were sent to the patient via MyChart unless otherwise noted below.    The patient was advised to call back or seek an in-person evaluation if the symptoms worsen or if the condition fails to improve as anticipated.  Time:  I spent 11 minutes with the patient via telehealth technology discussing the above problems/concerns.    Claiborne Rigg, NP

## 2022-01-26 NOTE — Patient Instructions (Signed)
Raynald Kemp, thank you for joining Claiborne Rigg, NP for today's virtual visit.  While this provider is not your primary care provider (PCP), if your PCP is located in our provider database this encounter information will be shared with them immediately following your visit.  Consent: (Patient) Cassandra Ibarra provided verbal consent for this virtual visit at the beginning of the encounter.  Current Medications:  Current Outpatient Medications:    amoxicillin-clavulanate (AUGMENTIN) 875-125 MG tablet, Take 1 tablet by mouth 2 (two) times daily for 7 days., Disp: 14 tablet, Rfl: 0   azelastine (ASTELIN) 0.1 % nasal spray, Place 2 sprays into both nostrils 2 (two) times daily. Use in each nostril as directed, Disp: 30 mL, Rfl: 0   albuterol (VENTOLIN HFA) 108 (90 Base) MCG/ACT inhaler, Inhale 2 puffs into the lungs every 6 (six) hours as needed for wheezing or shortness of breath., Disp: 16 g, Rfl: 0   benzonatate (TESSALON) 100 MG capsule, Take 1 capsule (100 mg total) by mouth 3 (three) times daily as needed for cough., Disp: 30 capsule, Rfl: 0   citalopram (CELEXA) 10 MG tablet, Take 1 tablet (10 mg total) by mouth daily., Disp: 30 tablet, Rfl: 2   medroxyPROGESTERone (PROVERA) 10 MG tablet, 1 tab Po daily for 1st 7 days of each month., Disp: 14 tablet, Rfl: 2   metFORMIN (GLUCOPHAGE) 500 MG tablet, 1 tab PO daily x 1 wk then BID, Disp: 60 tablet, Rfl: 1   mupirocin cream (BACTROBAN) 2 %, Apply 1 application topically 2 (two) times daily., Disp: 30 g, Rfl: 0   omeprazole (PRILOSEC) 20 MG capsule, Take 1 capsule (20 mg total) by mouth daily., Disp: 30 capsule, Rfl: 0   ondansetron (ZOFRAN-ODT) 4 MG disintegrating tablet, Take 1 tablet (4 mg total) by mouth every 8 (eight) hours as needed for nausea or vomiting., Disp: 20 tablet, Rfl: 0   predniSONE (DELTASONE) 20 MG tablet, Take 2 tablets (40 mg total) by mouth daily with breakfast for 5 days., Disp: 10 tablet, Rfl: 0   pregabalin  (LYRICA) 50 MG capsule, Take 1 capsule (50 mg total) by mouth 2 (two) times daily., Disp: 60 capsule, Rfl: 2   Vitamin D, Ergocalciferol, (DRISDOL) 50000 units CAPS capsule, Take 50,000 Units by mouth every 7 (seven) days., Disp: , Rfl:    Medications ordered in this encounter:  Meds ordered this encounter  Medications   amoxicillin-clavulanate (AUGMENTIN) 875-125 MG tablet    Sig: Take 1 tablet by mouth 2 (two) times daily for 7 days.    Dispense:  14 tablet    Refill:  0    Order Specific Question:   Supervising Provider    Answer:   MILLER, BRIAN [[id number]]   azelastine (ASTELIN) 0.1 % nasal spray    Sig: Place 2 sprays into both nostrils 2 (two) times daily. Use in each nostril as directed    Dispense:  30 mL    Refill:  0    Order Specific Question:   Supervising Provider    Answer:   Hyacinth Meeker, BRIAN [[id number]]   predniSONE (DELTASONE) 20 MG tablet    Sig: Take 2 tablets (40 mg total) by mouth daily with breakfast for 5 days.    Dispense:  10 tablet    Refill:  0    Order Specific Question:   Supervising Provider    Answer:   Hyacinth Meeker, BRIAN [[id number]]     *If you need refills on other medications prior to your next  appointment, please contact your pharmacy*  Follow-Up: Call back or seek an in-person evaluation if the symptoms worsen or if the condition fails to improve as anticipated.  Other Instructions INSTRUCTIONS: use a humidifier for nasal congestion Drink plenty of fluids, rest and wash hands frequently to avoid the spread of infection Alternate tylenol and Motrin for relief of fever    If you have been instructed to have an in-person evaluation today at a local Urgent Care facility, please use the link below. It will take you to a list of all of our available [facility], including address, phone number and hours of operation. Please do not delay care.  [facility]  If you or a family member do not have a primary care provider, use the link below to  schedule a visit and establish care. When you choose a [facility] primary care physician or advanced practice provider, you gain a long-term partner in health. Find a Primary Care Provider  Learn more about [facility] in-office and virtual care options: [facility] - Get Care Now

## 2022-05-12 ENCOUNTER — Telehealth: Payer: Self-pay | Admitting: Nurse Practitioner

## 2022-05-12 ENCOUNTER — Telehealth: Payer: Self-pay | Admitting: Physician Assistant

## 2022-05-12 DIAGNOSIS — U071 COVID-19: Secondary | ICD-10-CM

## 2022-05-12 DIAGNOSIS — B999 Unspecified infectious disease: Secondary | ICD-10-CM

## 2022-05-12 MED ORDER — PREDNISONE 20 MG PO TABS: 40.0000 mg | ORAL_TABLET | Freq: Every day | ORAL | 0 refills | Status: DC

## 2022-05-12 MED ORDER — AZITHROMYCIN 250 MG PO TABS: ORAL_TABLET | ORAL | 0 refills | Status: AC

## 2022-05-12 MED ORDER — LEVALBUTEROL TARTRATE 45 MCG/ACT IN AERO: 1.0000 | INHALATION_SPRAY | Freq: Four times a day (QID) | RESPIRATORY_TRACT | 0 refills | Status: DC | PRN

## 2022-05-12 NOTE — Progress Notes (Signed)
Because of your recent diagnosis with COVID and worsening symptoms, I feel your condition warrants further evaluation and I recommend that you be seen in a face to face visit.   NOTE: There will be NO CHARGE for this eVisit   If you are having a true medical emergency please call 911.      For an urgent face to face visit,  has seven urgent care centers for your convenience:     [facility] at [facility] Directions [phone] [address] [place], [place] [address]    [facility]) Get Driving Directions [phone] [address] [place], [place] [address]  [facility] [place]) Get Driving Directions [phone] [address] [place],  [place]  [address]  [facility] Ur[facility] Center [facility] - at [employer] Directions  [phone] [address] [place],  [place] [address]   [facility] Care at [facility] Get Driving Directions [phone] [address] [place], [place] [address]   [facility] at [facility] Get Driving Directions  [phone] [address] [place], [place] [address]   [facility] Ur[facility] Care at [facility] Directions [phone] [address] [place], [place] [address]  Your MyChart E-visit questionnaire answers were reviewed by a board certified advanced clinical practitioner to complete your personal care plan based on your specific symptoms.  Thank you for using e-Visits.

## 2022-05-12 NOTE — Patient Instructions (Signed)
Cassandra Ibarra, thank you for joining Margaretann Loveless, PA-C for today's virtual visit.  While this provider is not your primary care provider (PCP), if your PCP is located in our provider database this encounter information will be shared with them immediately following your visit.  Consent: (Patient) Cassandra Ibarra provided verbal consent for this virtual visit at the beginning of the encounter.  Current Medications:  Current Outpatient Medications:    azithromycin (ZITHROMAX) 250 MG tablet, Take 2 tablets on day 1, then 1 tablet daily on days 2 through 5, Disp: 6 tablet, Rfl: 0   levalbuterol (XOPENEX HFA) 45 MCG/ACT inhaler, Inhale 1-2 puffs into the lungs every 6 (six) hours as needed for wheezing., Disp: 45 g, Rfl: 0   predniSONE (DELTASONE) 20 MG tablet, Take 2 tablets (40 mg total) by mouth daily with breakfast., Disp: 10 tablet, Rfl: 0   citalopram (CELEXA) 10 MG tablet, Take 1 tablet (10 mg total) by mouth daily., Disp: 30 tablet, Rfl: 2   Medications ordered in this encounter:  Meds ordered this encounter  Medications   azithromycin (ZITHROMAX) 250 MG tablet    Sig: Take 2 tablets on day 1, then 1 tablet daily on days 2 through 5    Dispense:  6 tablet    Refill:  0    Order Specific Question:   Supervising Provider    Answer:   Merrilee Jansky [[id number]]   levalbuterol (XOPENEX HFA) 45 MCG/ACT inhaler    Sig: Inhale 1-2 puffs into the lungs every 6 (six) hours as needed for wheezing.    Dispense:  45 g    Refill:  0    Order Specific Question:   Supervising Provider    Answer:   Merrilee Jansky [[id number]]   predniSONE (DELTASONE) 20 MG tablet    Sig: Take 2 tablets (40 mg total) by mouth daily with breakfast.    Dispense:  10 tablet    Refill:  0    Order Specific Question:   Supervising Provider    Answer:   Merrilee Jansky [[id number]]     *If you need refills on other medications prior to your next appointment, please contact your  pharmacy*  Follow-Up: Call back or seek an in-person evaluation if the symptoms worsen or if the condition fails to improve as anticipated.  Other Instructions  Quarantine and Isolation Quarantine and isolation refer to local and travel restrictions to protect the public and travelers from contagious diseases that constitute a public health threat. Contagious diseases are diseases that can spread from one person to another. Quarantine and isolation help to protect the public by preventing exposure to people who have or may have a contagious disease. Isolation separates people who are sick with a contagious disease from people who are not sick. Quarantine separates and restricts the movement of people who were exposed to a contagious disease to see if they become sick. You may be put in quarantine or isolation if you have been exposed to or diagnosed with any of the following diseases: Severe acute respiratory syndromes, such as COVID-19. Cholera. Diphtheria. Tuberculosis. Plague. Smallpox. Yellow fever. Viral hemorrhagic fevers, such as [place], Ebola, and [place]. When to quarantine or isolate Follow these rules, whether you have been vaccinated or not: Stay home and isolate from others when you are sick with a contagious disease. Isolate when you test positive for a contagious disease, even if you do not have symptoms. Isolate if you are sick and suspect  that you may have a contagious disease. If you suspect that you have a contagious disease, get tested. If your test results are negative, you can end your isolation. If your test results are positive, follow the full isolation recommendations as told by your health care provider or local health authorities. Quarantine and stay away from others when you have been in close contact with someone who has tested positive for a contagious disease. Close contact is defined as being less than 6 ft (1.8 m) away from an infected person for a  total of 15 minutes or more over a 24-hour period. Do not go to places where you are unable to wear a mask, such as restaurants and some gyms. Stay home and separate from others as much as possible. Avoid being around people who may get very sick from the contagious disease that you have. Use a separate bathroom, if possible. Do not travel. For travel guidance, visit the CDC's travel webpage at http://espinoza.biz/ Follow these instructions at home: Medicines  Take over-the-counter and prescription medicines as told by your health care provider. Finish all antibiotic medicine even when you start to feel better. Stay up to date with all your vaccines. Get scheduled vaccines and boosters as recommended by your health care provider. Lifestyle Wear a high-quality mask if you must be around others at home and in public, if recommended. Improve air flow (ventilation) at home to help prevent the disease from spreading to other people, if possible. Do not share personal household items, like cups, towels, and utensils. Practice everyday hygiene and cleaning. General instructions Talk to your health care provider if you have a weakened body defense system (immune system). People with a weakened immune system may have a reduced immune response to vaccines. You may need to follow current prevention measures, including wearing a well-fitting mask, avoiding crowds, and avoiding poorly ventilated indoor places. Monitor symptoms and follow health care provider instructions, which may include resting, drinking fluids, and taking medicines. Follow specific isolation and quarantine recommendations if you are in places that can lead to disease outbreaks, such as correctional and detention facilities, homeless shelters, and cruise ships. Return to your normal activities as told by your health care provider. Ask your health care provider what activities are safe for you. Keep all follow-up visits. This is  important. Where to find more information CDC: https://martinez.com/ Contact a health care provider if: You have a fever. You have signs and symptoms that return or get worse after isolation. Get help right away if: You have difficulty breathing. You have chest pain. These symptoms may be an emergency. Get help right away. Call 911. Do not wait to see if the symptoms will go away. Do not drive yourself to the hospital. Summary Isolation and quarantine help protect the public by preventing exposure to people who have or may have a contagious disease. Isolate when you are sick or when you test positive, even if you do not have symptoms. Quarantine and stay away from others when you have been in close contact with someone who has tested positive for a contagious disease. This information is not intended to replace advice given to you by your health care provider. Make sure you discuss any questions you have with your health care provider. Document Revised: 08/29/2021 Document Reviewed: 08/08/2021 Elsevier Patient Education  2023 Elsevier Inc.    If you have been instructed to have an in-person evaluation today at a local Urgent Care facility, please use the link below. It  will take you to a list of all of our available [facility], including address, phone number and hours of operation. Please do not delay care.  [facility]  If you or a family member do not have a primary care provider, use the link below to schedule a visit and establish care. When you choose a [facility] primary care physician or advanced practice provider, you gain a long-term partner in health. Find a Primary Care Provider  Learn more about [facility]'s in-office and virtual care options: [facility] - Get Care Now

## 2022-05-12 NOTE — Progress Notes (Signed)
Virtual Visit Consent   Cassandra Ibarra, you are scheduled for a virtual visit with a [facility] provider today. Just as with appointments in the office, your consent must be obtained to participate. Your consent will be active for this visit and any virtual visit you may have with one of our providers in the next 365 days. If you have a MyChart account, a copy of this consent can be sent to you electronically.  As this is a virtual visit, video technology does not allow for your provider to perform a traditional examination. This may limit your provider's ability to fully assess your condition. If your provider identifies any concerns that need to be evaluated in person or the need to arrange testing (such as labs, EKG, etc.), we will make arrangements to do so. Although advances in technology are sophisticated, we cannot ensure that it will always work on either your end or our end. If the connection with a video visit is poor, the visit may have to be switched to a telephone visit. With either a video or telephone visit, we are not always able to ensure that we have a secure connection.  By engaging in this virtual visit, you consent to the provision of healthcare and authorize for your insurance to be billed (if applicable) for the services provided during this visit. Depending on your insurance coverage, you may receive a charge related to this service.  I need to obtain your verbal consent now. Are you willing to proceed with your visit today? Cassandra Ibarra has provided verbal consent on 05/12/2022 for a virtual visit (video or telephone). Margaretann Loveless, PA-C  Date: 05/12/2022 2:40 PM  Virtual Visit via Video Note   I, Margaretann Loveless, connected with  Cassandra Ibarra  ([id number], 1991/01/16) on 05/12/22 at  2:30 PM EDT by a video-enabled telemedicine application and verified that I am speaking with the correct person using two identifiers.  Location: Patient: Virtual Visit  Location Patient: Home Provider: Virtual Visit Location Provider: Home Office   I discussed the limitations of evaluation and management by telemedicine and the availability of in person appointments. The patient expressed understanding and agreed to proceed.    History of Present Illness: Cassandra Ibarra is a 31 y.o. who identifies as a female who was assigned female at birth, and is being seen today for Covid 19 and worsening symptoms. Tested positive on 05/08/22.  HPI: URI  This is a new problem. Episode onset: Started feeling better yesterday, then symptoms worsened last night; symptoms started on 05/08/22. The problem has been gradually worsening. Associated symptoms include chest pain, congestion, coughing, headaches, sinus pain and a sore throat. Pertinent negatives include no rhinorrhea. Associated symptoms comments: Loss of smell and taste now today. Treatments tried: tylenol cold and flu, tessalon perles. The treatment provided no relief.     Problems:  Patient Active Problem List   Diagnosis Date Noted   Fibromyalgia 06/20/2019   Anxiety and depression 06/20/2019   Morbid obesity (HCC) 06/20/2019   PCOS (polycystic ovarian syndrome) 05/18/2015    Allergies:  Allergies  Allergen Reactions   Ocella [Drospirenone-Ethinyl Estradiol] Shortness Of Breath   Adhesive [Tape] Itching   Lo Loestrin Fe [Norethin Ace-Eth Estrad-Fe] Palpitations   Lorazepam Other (See Comments)    "makes me angry"   Monistat [Tioconazole] Itching   Medications:  Current Outpatient Medications:    azithromycin (ZITHROMAX) 250 MG tablet, Take 2 tablets on day 1, then 1 tablet daily  on days 2 through 5, Disp: 6 tablet, Rfl: 0   levalbuterol (XOPENEX HFA) 45 MCG/ACT inhaler, Inhale 1-2 puffs into the lungs every 6 (six) hours as needed for wheezing., Disp: 45 g, Rfl: 0   predniSONE (DELTASONE) 20 MG tablet, Take 2 tablets (40 mg total) by mouth daily with breakfast., Disp: 10 tablet, Rfl: 0    citalopram (CELEXA) 10 MG tablet, Take 1 tablet (10 mg total) by mouth daily., Disp: 30 tablet, Rfl: 2  Observations/Objective: Patient is well-developed, well-nourished in no acute distress.  Resting comfortably at home.  Head is normocephalic, atraumatic.  No labored breathing.  Speech is clear and coherent with logical content.  Patient is alert and oriented at baseline.    Assessment and Plan: 1. COVID-19 - azithromycin (ZITHROMAX) 250 MG tablet; Take 2 tablets on day 1, then 1 tablet daily on days 2 through 5  Dispense: 6 tablet; Refill: 0 - levalbuterol (XOPENEX HFA) 45 MCG/ACT inhaler; Inhale 1-2 puffs into the lungs every 6 (six) hours as needed for wheezing.  Dispense: 45 g; Refill: 0 - predniSONE (DELTASONE) 20 MG tablet; Take 2 tablets (40 mg total) by mouth daily with breakfast.  Dispense: 10 tablet; Refill: 0  2. Superimposed infection - azithromycin (ZITHROMAX) 250 MG tablet; Take 2 tablets on day 1, then 1 tablet daily on days 2 through 5  Dispense: 6 tablet; Refill: 0 - levalbuterol (XOPENEX HFA) 45 MCG/ACT inhaler; Inhale 1-2 puffs into the lungs every 6 (six) hours as needed for wheezing.  Dispense: 45 g; Refill: 0 - predniSONE (DELTASONE) 20 MG tablet; Take 2 tablets (40 mg total) by mouth daily with breakfast.  Dispense: 10 tablet; Refill: 0  - Continue OTC symptomatic management of choice - Will send OTC vitamins and supplement information through [employer] - Zpack, Prednisone and albuterol prescribed for superimposed bacterial infection, suspicious for pneumonia - Patient enrolled in MyChart symptom monitoring - Push fluids - Rest as needed - Discussed return precautions and when to seek in-person evaluation, sent via [facility] as well   Follow Up Instructions: I discussed the assessment and treatment plan with the patient. The patient was provided an opportunity to ask questions and all were answered. The patient agreed with the plan and demonstrated an understanding of the  instructions.  A copy of instructions were sent to the patient via MyChart unless otherwise noted below.    The patient was advised to call back or seek an in-person evaluation if the symptoms worsen or if the condition fails to improve as anticipated.  Time:  I spent 12 minutes with the patient via telehealth technology discussing the above problems/concerns.    Margaretann Loveless, PA-C

## 2022-05-27 ENCOUNTER — Telehealth: Payer: BC Managed Care – PPO | Admitting: Family Medicine

## 2022-05-27 ENCOUNTER — Telehealth: Payer: BC Managed Care – PPO | Admitting: Nurse Practitioner

## 2022-05-27 DIAGNOSIS — U071 COVID-19: Secondary | ICD-10-CM

## 2022-05-27 NOTE — Progress Notes (Signed)
Because your symptoms have been present for so long and due to your recent COVID infection and shortness of breath, I feel your condition warrants further evaluation and I recommend that you be seen in a face to face visit.   NOTE: There will be NO CHARGE for this eVisit   If you are having a true medical emergency please call 911.      For an urgent face to face visit, [facility] has seven urgent care centers for your convenience:     [place] [facility] [place] [facility] [place] Get Driving Directions [phone] [place], [place] [address]    [place] [facility] [place] [facility]) Get Driving Directions [phone] [place], [place] [address]  [place] [facility] [place] [facility]) Get Driving Directions [phone] [address] [place],  [place]  [address]  [place] [facility] [place] [facility] - at [place] Get Driving Directions  [phone] [address] [place],  [place] [address]   [place] [facility] at [facility] Get Driving Directions [phone] [place], [place], [place] [address]   [place] at [facility] Get Driving Directions  [phone] [address] [place], [place] [address]   [place] [facility] at [place] Get Driving Directions [phone] [address], [place],  [address]  Your MyChart E-visit questionnaire answers were reviewed by a board certified advanced clinical practitioner to complete your personal care plan based on your specific symptoms.  Thank you for using e-Visits.

## 2022-05-27 NOTE — Progress Notes (Signed)
   Needs in person eval given on going duration post COVID with shortness of breath, pain and fatigue.  Patient acknowledged agreement and understanding of the plan.

## 2022-06-25 ENCOUNTER — Telehealth: Payer: BC Managed Care – PPO | Admitting: Emergency Medicine

## 2022-06-25 DIAGNOSIS — R112 Nausea with vomiting, unspecified: Secondary | ICD-10-CM

## 2022-06-25 MED ORDER — ONDANSETRON HCL 4 MG PO TABS: 4.0000 mg | ORAL_TABLET | Freq: Three times a day (TID) | ORAL | 0 refills | Status: DC | PRN

## 2022-06-25 NOTE — Progress Notes (Signed)
E-Visit for Vomiting  We are sorry that you are not feeling well. Here is how we plan to help!  I'll send a work note for you through [employer].   Based on what you have shared with me it looks like you have a Virus that is irritating your GI tract.  Vomiting is the forceful emptying of a portion of the stomach's content through the mouth.  Although nausea and vomiting can make you feel miserable, it's important to remember that these are not diseases, but rather symptoms of an underlying illness.  When we treat short term symptoms, we always caution that any symptoms that persist should be fully evaluated in a medical office.  I have prescribed a medication that will help alleviate your symptoms and allow you to stay hydrated:  Zofran 4 mg 1 tablet every 8 hours as needed for nausea and vomiting  HOME CARE: Drink clear liquids.  This is very important! Dehydration (the lack of fluid) can lead to a serious complication.  Start off with 1 tablespoon every 5 minutes for 8 hours. You may begin eating bland foods after 8 hours without vomiting.  Start with saltine crackers, white bread, rice, mashed potatoes, applesauce. After 48 hours on a bland diet, you may resume a normal diet. Try to go to sleep.  Sleep often empties the stomach and relieves the need to vomit.  GET HELP RIGHT AWAY IF:  Your symptoms do not improve or worsen within 2 days after treatment. You have a fever for over 3 days. You cannot keep down fluids after trying the medication.  MAKE SURE YOU:  Understand these instructions. Will watch your condition. Will get help right away if you are not doing well or get worse.   Thank you for choosing an e-visit.  Your e-visit answers were reviewed by a board certified advanced clinical practitioner to complete your personal care plan. Depending upon the condition, your plan could have included both over the counter or prescription medications.  Please review your pharmacy  choice. Make sure the pharmacy is open so you can pick up prescription now. If there is a problem, you may contact your provider through [employer] and have the prescription routed to another pharmacy.  Your safety is important to [place]. If you have drug allergies check your prescription carefully.   For the next 24 hours you can use MyChart to ask questions about today's visit, request a non-urgent call back, or ask for a work or school excuse. You will get an email in the next two days asking about your experience. I hope that your e-visit has been valuable and will speed your recovery.   I have spent 5 minutes in review of e-visit questionnaire, review and updating patient chart, medical decision making and response to patient.   Willeen Cass, PhD, FNP-BC

## 2022-07-07 ENCOUNTER — Encounter (HOSPITAL_COMMUNITY): Payer: Self-pay | Admitting: Emergency Medicine

## 2022-07-07 ENCOUNTER — Ambulatory Visit (INDEPENDENT_AMBULATORY_CARE_PROVIDER_SITE_OTHER): Payer: BC Managed Care – PPO

## 2022-07-07 ENCOUNTER — Ambulatory Visit (HOSPITAL_COMMUNITY)
Admission: EM | Admit: 2022-07-07 | Discharge: 2022-07-07 | Disposition: A | Discharge status: Home / Self Care | Payer: BC Managed Care – PPO | Attending: Internal Medicine | Admitting: Internal Medicine

## 2022-07-07 DIAGNOSIS — R0602 Shortness of breath: Secondary | ICD-10-CM

## 2022-07-07 DIAGNOSIS — J209 Acute bronchitis, unspecified: Secondary | ICD-10-CM

## 2022-07-07 LAB — CBC WITH DIFFERENTIAL/PLATELET
Abs Immature Granulocytes: 0.1 10*3/uL — ABNORMAL HIGH (ref 0.00–0.07)
Basophils Absolute: 0.1 10*3/uL (ref 0.0–0.1)
Basophils Relative: 1 %
Eosinophils Absolute: 0.2 10*3/uL (ref 0.0–0.5)
Eosinophils Relative: 2 %
HCT: 43.4 % (ref 36.0–46.0)
Hemoglobin: 14.1 g/dL (ref 12.0–15.0)
Immature Granulocytes: 1 %
Lymphocytes Relative: 24 %
Lymphs Abs: 3.1 10*3/uL (ref 0.7–4.0)
MCH: 27.5 pg (ref 26.0–34.0)
MCHC: 32.5 g/dL (ref 30.0–36.0)
MCV: 84.6 fL (ref 80.0–100.0)
Monocytes Absolute: 0.9 10*3/uL (ref 0.1–1.0)
Monocytes Relative: 7 %
Neutro Abs: 8.9 10*3/uL — ABNORMAL HIGH (ref 1.7–7.7)
Neutrophils Relative %: 65 %
Platelets: 394 10*3/uL (ref 150–400)
RBC: 5.13 MIL/uL — ABNORMAL HIGH (ref 3.87–5.11)
RDW: 13.4 % (ref 11.5–15.5)
WBC: 13.3 10*3/uL — ABNORMAL HIGH (ref 4.0–10.5)
nRBC: 0 % (ref 0.0–0.2)

## 2022-07-07 MED ORDER — BENZONATATE 100 MG PO CAPS: 200.0000 mg | ORAL_CAPSULE | Freq: Three times a day (TID) | ORAL | 0 refills | Status: AC | PRN

## 2022-07-07 MED ORDER — PREDNISONE 20 MG PO TABS: 40.0000 mg | ORAL_TABLET | Freq: Every day | ORAL | 0 refills | Status: AC

## 2022-07-07 MED ORDER — IPRATROPIUM-ALBUTEROL 0.5-2.5 (3) MG/3ML IN SOLN
3.0000 mL | Freq: Once | RESPIRATORY_TRACT | Status: AC
  Administered 2022-07-07: 3 mL via RESPIRATORY_TRACT

## 2022-07-07 MED ORDER — GUAIFENESIN ER 600 MG PO TB12: 600.0000 mg | ORAL_TABLET | Freq: Two times a day (BID) | ORAL | 0 refills | Status: AC

## 2022-07-07 MED ORDER — IPRATROPIUM-ALBUTEROL 0.5-2.5 (3) MG/3ML IN SOLN
RESPIRATORY_TRACT | Status: AC
  Filled 2022-07-07: qty 3

## 2022-07-07 MED ORDER — ALBUTEROL SULFATE HFA 108 (90 BASE) MCG/ACT IN AERS: 2.0000 | INHALATION_SPRAY | Freq: Four times a day (QID) | RESPIRATORY_TRACT | 0 refills | Status: AC | PRN

## 2022-07-07 MED ORDER — IBUPROFEN 800 MG PO TABS: 800.0000 mg | ORAL_TABLET | Freq: Three times a day (TID) | ORAL | 0 refills | Status: DC | PRN

## 2022-07-07 NOTE — Discharge Instructions (Addendum)
Please maintain adequate hydration Take medications as prescribed We will give you a call if lab results are abnormal Your chest x-ray is negative for pneumonia Return to urgent care if you have worsening symptoms.

## 2022-07-07 NOTE — ED Triage Notes (Signed)
Pt reports shortness of breath, cough, eyes swelling shut, fatigue, and feeling like she's choking. Symptoms began Thursday. Took home covid test yesterday and it was neg.

## 2022-07-07 NOTE — ED Provider Notes (Signed)
[facility]    CSN: [id number] Arrival date & time: 07/07/22  1405      History   Chief Complaint Chief Complaint  Patient presents with   Shortness of Breath   Cough    HPI Cassandra Ibarra is a 31 y.o. female.   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   both hands   Gastric ulcer    OCD (obsessive compulsive disorder)    PCOS (polycystic ovarian syndrome)    PONV (postoperative nausea and vomiting)     Patient Active Problem List   Diagnosis Date Noted   Fibromyalgia 06/20/2019   Anxiety and depression 06/20/2019   Morbid obesity (HCC) 06/20/2019   PCOS (polycystic ovarian syndrome) 05/18/2015    Past Surgical History:  Procedure Laterality Date   ESOPHAGOGASTRODUODENOSCOPY (EGD) WITH PROPOFOL N/A 02/09/2014   Procedure: ESOPHAGOGASTRODUODENOSCOPY (EGD) WITH PROPOFOL;  Surgeon: Theda Belfast, MD;  Location: [facility] ENDOSCOPY;  Service: Endoscopy;  Laterality: N/A;   WISDOM TOOTH EXTRACTION Left 09/2013    OB History   No obstetric history on file.      Home Medications    Prior to Admission medications   Medication Sig Start Date End Date Taking? Authorizing Provider  albuterol (VENTOLIN HFA) 108 (90 Base) MCG/ACT inhaler Inhale 2 puffs into the lungs every 6 (six) hours as needed for wheezing or shortness of breath. 07/07/22  Yes Tacari Repass, Britta Mccreedy, MD  benzonatate (TESSALON) 100 MG capsule Take 2 capsules (200 mg total) by mouth 3 (three) times daily as needed for up to 10 days for cough. 07/07/22 07/17/22 Yes Wayde Gopaul, Britta Mccreedy, MD  guaiFENesin (MUCINEX) 600 MG 12 hr tablet Take 1 tablet (600 mg total) by mouth 2 (two) times daily for 10 days. 07/07/22 07/17/22 Yes Jerimiah Wolman, Britta Mccreedy, MD  ibuprofen (ADVIL) 800 MG tablet Take 1 tablet (800 mg total) by mouth every 8 (eight) hours as needed for moderate pain. 07/07/22  Yes Rayane Gallardo, Britta Mccreedy, MD  predniSONE (DELTASONE) 20 MG tablet Take 2 tablets (40 mg  total) by mouth daily for 5 days. 07/07/22 07/12/22 Yes Ruchi Stoney, Britta Mccreedy, MD  citalopram (CELEXA) 10 MG tablet Take 1 tablet (10 mg total) by mouth daily. 06/20/19   Marcine Matar, MD  levalbuterol [facility] HFA) 45 MCG/ACT inhaler Inhale 1-2 puffs into the lungs every 6 (six) hours as needed for wheezing. 05/12/22   Margaretann Loveless, PA-C  ondansetron (ZOFRAN) 4 MG tablet Take 1 tablet (4 mg total) by mouth every 8 (eight) hours as needed for nausea or vomiting. 06/25/22   Cathlyn Parsons, NP    Family History Family History  Problem Relation Age of Onset   Arthritis Mother    Heart disease Father    Hyperlipidemia Father    Crohn's disease Neg Hx     Social History Social History   Tobacco Use   Smoking status: Never   Smokeless tobacco: Never  Vaping Use   Vaping Use: Never used  Substance Use Topics   Alcohol use: Yes    Comment: couple times a month   Drug use: No     Allergies   Ocella [drospirenone-ethinyl estradiol], Adhesive [tape], Lo loestrin fe [norethin ace-eth estrad-fe], Lorazepam, and Monistat [tioconazole]   Review of Systems Review of Systems   Physical Exam Triage Vital Signs ED Triage Vitals [07/07/22 1420]  Enc Vitals Group     BP 130/78  Pulse Rate (!) 114     Resp (!) 24     Temp      Temp src      SpO2 97 %     Weight      Height      Head Circumference      Peak Flow      Pain Score 4     Pain Loc      Pain Edu?      Excl. in GC?    No data found.  Updated Vital Signs BP 130/78 (BP Location: Right Wrist)   Pulse (!) 114   Resp (!) 24   SpO2 97%   Visual Acuity Right Eye Distance:   Left Eye Distance:   Bilateral Distance:    Right Eye Near:   Left Eye Near:    Bilateral Near:     Physical Exam   UC Treatments / Results  Labs (all labs ordered are listed, but only abnormal results are displayed) Labs Reviewed  CBC WITH DIFFERENTIAL/PLATELET - Abnormal; Notable for the following components:      Result  Value   WBC 13.3 (*)    RBC 5.13 (*)    Neutro Abs 8.9 (*)    Abs Immature Granulocytes 0.10 (*)    All other components within normal limits    EKG   Radiology DG Chest 2 View  Result Date: 07/07/2022 CLINICAL DATA:  Shortness of breath EXAM: CHEST - 2 VIEW COMPARISON:  02/15/2019 FINDINGS: Transverse diameter of heart is in the upper limits of normal. There is poor inspiration. There are no signs of pulmonary edema or focal pulmonary consolidation. There is no pleural effusion or pneumothorax. IMPRESSION: There are no new infiltrates or signs of pulmonary edema. Electronically Signed   By: Ernie Avena M.D.   On: 07/07/2022 14:50    Procedures Procedures (including critical care time)  Medications Ordered in UC Medications  ipratropium-albuterol (DUONEB) 0.5-2.5 (3) MG/3ML nebulizer solution 3 mL (3 mLs Nebulization Given 07/07/22 1427)    Initial Impression / Assessment and Plan / UC Course  I have reviewed the triage vital signs and the nursing notes.  Pertinent labs & imaging results that were available during my care of the patient were reviewed by me and considered in my medical decision making (see chart for details).     *** Final Clinical Impressions(s) / UC Diagnoses   Final diagnoses:  Acute bronchitis with bronchospasm     Discharge Instructions      Please maintain adequate hydration Take medications as prescribed We will give you a call if lab results are abnormal Your chest x-ray is negative for pneumonia Return to urgent care if you have worsening symptoms.   ED Prescriptions     Medication Sig Dispense Auth. Provider   albuterol (VENTOLIN HFA) 108 (90 Base) MCG/ACT inhaler Inhale 2 puffs into the lungs every 6 (six) hours as needed for wheezing or shortness of breath. 6.7 g Merrilee Jansky, MD   guaiFENesin (MUCINEX) 600 MG 12 hr tablet Take 1 tablet (600 mg total) by mouth 2 (two) times daily for 10 days. 20 tablet Surah Pelley, Britta Mccreedy, MD    predniSONE (DELTASONE) 20 MG tablet Take 2 tablets (40 mg total) by mouth daily for 5 days. 10 tablet Longino Trefz, Britta Mccreedy, MD   benzonatate (TESSALON) 100 MG capsule Take 2 capsules (200 mg total) by mouth 3 (three) times daily as needed for up to 10 days for cough. 30 capsule  Chase Picket, MD   ibuprofen (ADVIL) 800 MG tablet Take 1 tablet (800 mg total) by mouth every 8 (eight) hours as needed for moderate pain. 21 tablet Jaicey Sweaney, Myrene Galas, MD      PDMP not reviewed this encounter.

## 2022-07-14 ENCOUNTER — Telehealth: Payer: BC Managed Care – PPO | Admitting: Nurse Practitioner

## 2022-07-14 NOTE — Progress Notes (Signed)
Because your symptoms have persisted and worsened since your urgent care visit, I feel your condition warrants further evaluation and I recommend that you be seen in a face to face visit.   NOTE: There will be NO CHARGE for this eVisit   If you are having a true medical emergency please call 911.      For an urgent face to face visit, [facility] has seven urgent care centers for your convenience:     [facility] at [facility] Directions [phone] [address] [place], [place] [address]    [facility] Center [facility]) Get Driving Directions [phone] [address] [place], [place] [address]  [facility] Urgent [facility] Center [facility]) Get Driving Directions [phone] [address] [place],  [place]  [address]  [facility] Center [facility] - at [employer] Directions  [phone] [address] [place],  [place] [address]   [facility] Health Urgent Care at [facility] Get Driving Directions [phone] [address] [place], [place] [address]   [facility] Urgent Care at [facility] Get Driving Directions  [phone] [address] [place], [place] [address]   [facility] Health Urgent Care at [facility] Directions [phone] [address] [place], [place] [address]  Your MyChart E-visit questionnaire answers were reviewed by a board certified advanced clinical practitioner to complete your personal care plan based on your specific symptoms.  Thank you for using e-Visits.

## 2022-10-16 ENCOUNTER — Ambulatory Visit: Payer: BC Managed Care – PPO | Admitting: Clinical

## 2022-10-16 DIAGNOSIS — F4323 Adjustment disorder with mixed anxiety and depressed mood: Secondary | ICD-10-CM

## 2022-10-16 NOTE — Progress Notes (Signed)
Time: 10:00am-10:55am CPT Code: [id number] Diagnosis: F43.23  Cassandra Ibarra was seen remotely using secure video conferencing. She was in a secure location at her place of work and the therapist was in her home at the time of the appointment. Following review of consent forms, Cassandra Ibarra completed an intake for therapy and a plan for treatment. She provided input and verbal consent to all goals for treatment.  Intake Presenting Problem  Cassandra Ibarra presented seeking individual therapy. She shared that she has had one of the hardest years of her life. She suffered COVID in September, followed by pneumonia, followed by bronchitis. She and her wife separated but were still living together at time of intake. Since 2023/09/16, her  grandmother died, then her father passed away three weeks later. She had not been in contact with either for mental health purposes for 10 years. Her ex-wife suffered a motorcycle accident two weeks before the initial session and Cassandra Ibarra was caring for her during her recovery. Suicidal ideation and intent were denied. Symptoms Fatigue/lack of energy, sleep disturbances including vivid dreams, she shared a general sense of having a low battery/emotional exhaustion, difficulty connecting with emotions and a sense of "pushing through" History of Problem  Cassandra Ibarra suffers from polycystic ovarian syndrome. End of 2017/2018 she was taking different hormone based birth controls. 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 These lasted roughly two weeks. She reported that she "woke up and cried the whole day" for two weeks. She attended therapy during this time for about two months. She also began taking antidepressant medication. She also attended therapy in 2012 following a bad breakup. She first began taking antidepressants in 2018, but reported a history of "taking and stopping them." She has taken  Lexapro, buspar, trazodone, and has spent the most time taking citalopram. She reported some fear of taking new medication due to her experiences with an uncle who took Wellbutrin and had a terrible suicidal ideation episode that resulted in him going missing for three days. She shared that she has similar medication reactions to her mother. She was not taking any prescription medications at time of intake. She is prescribed zetbound and Vyvanse but has not been able to access Vyvanse due to the shortage and has not been actively taking either. Cassandra Ibarra revealed that she had caught her father stalking her home 6 months prior to his death. Recent Trigger   Marital and Family Information  She and her wife had been married for over 8 years, and together for 10 years prior to divorcing. She shared that she had a high school relationship that ended prior to college, several relationships in college, and met her wife and married her by 40.   Present family concerns/problems: Cassandra Ibarra is very close with her mother. She has a sister who is 10 years older with whom she has a good relationship. Her father died in 09-26-2022. She has extended family  she sees on holidays. She has not communicated with anyone on her father's side for ten years except for one uncle. She described her father as an addict since before she was born. He underwent a quadruple bypass when Cassandra Ibarra was 10, and became addicted to prescription painkillers following this. She described him as manipulative and narcisstic toward people he was close with. She reported frequent drama in family relationships having to do with her father's addictions, and aspects of her childhood as "chaotic." She shared that  she had been "daddy's little girl" prior to maturing and becoming more aware of his challenges. She shared that she had only spoken with her father twice in the 10 years leading to his death. She also reported writing him a long letter that he did not  respond to, which was a hurtful experience for her. He reportedly died as a result of a heart attack three weeks after the death of Cassandra Ibarra grandmother, who died following a fall.  Strengths/resources in the family/friends:  She shared that she participates in an active friend community online. She has met and planned trips with several people from the group. She is still close with two of her best friends from childhood. She also maintains a close relationship with her older sister and several work friends. However, she described herself as less social than pre-COVID.  Family background / ethnic factors: Cassandra Ibarra shared that her wife was a cisgendered female for the beginning of their relationship, but transitioned to female in 2019. She described this as very difficult for her, and that there were "no warning signs." She shared that prior to the transition, her wife had been very masculine, and described the experience as being "like my husband died." Looking back, she wonders if they should have ended the relationship at that time. Cassandra Ibarra that her wife has a history of hiding the truth not to hurt people, but eventually revealed that she was no longer attracted to women. She reported that this revelation had prompted her to grieve her husband and earlier stages of the relationship, but not her wife.   No needs/concerns related to ethnicity reported when asked: No  Education/Vocation  Interpersonal concerns/problems: None. 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 Personal strengths: Cassandra Ibarra shared that she is continuing to function despite challenges, she actively looks for the positive Military/work problems/concerns: None  Leisure Activities/Daily Functioning : no changes noted. Enjoys online social activities, including talking and gaming Legal Status  No Legal Problems: No Medical/Nutritional Concerns  Comments: polycystic  ovarian syndrome, ADHD, fibromyalgia, questions whether she may be on the spectrum  Substance use/abuse/dependence: Cassandra Ibarra described herself as a social drinker, who drinks occasionally (every other week). She has previously "dabbled" with edibles, but found it difficult to find a good dose.   Comments:   Religion/Spirituality: Cassandra Ibarra described herself as leaning toward agnosticism. She suspects she may have religious trauma from childhood.   General Behavior: WNL Attire: WNL Gait: not observed-telehealth Motor Activity: WNL Stream of Thought - Productivity: WNL Stream of thought - Progression: WNL Stream of thought - Language:  WNL Emotional tone and reactions - Mood: WNL  Emotional tone and reactions - Affect: WNL Mental trend/Content of thoughts - Perception: WNL Mental trend/Content of thoughts - Orientation: WNL Mental trend/Content of thoughts - Memory: WNL Mental trend/Content of thoughts - General knowledge: WNL Insight: WNL Judgment: WNL Intelligence: WNL Mental Status Comment: WNL Diagnostic Summary Adjustment Disorder with Anxiety and Depression, F43.23   Treatment Plan Client Abilities/Strengths  Cassandra Ibarra described herself as open to listen to insights from others.  Client Treatment Preferences:  Cassandra Ibarra shared that she is ok with appointments during the daytime, but there is a chance she may need to change her appointments to better fit her work schedule. She prefers appointments on Mondays, Wednesdays, or Fridays.  Client Statement of Needs  Cassandra Ibarra shared that she is in need of a professional outlet for stress.  Treatment Level  She shared that she would probably benefit from biweekly sessions.   Symptoms  Problems Addressed  Cassandra Ibarra is going through a divorce that marks the end of a 10-year relationship, as well as the losses of several close family members. She described her childhood as "chaotic," with ongoing stress related to her father's health concerns.   Goals 1. Cassandra Ibarra will be provided with support and, when appropriate, strategies to cope with her current situation. Objective Cassandra Ibarra will develop coping strategies and increase overall motivation.  Target Date: 10/17/2023 Frequency: Biweekly  Progress: 0 Modality: Individual   Related Interventions Cassandra Ibarra will be provided with opportunities to process her experiences in session Therapist will help Cassandra Ibarra to notice and disengage from maladaptive thoughts and behaviors using CBT-based strategies Cassandra Ibarra will have opportunities to process experiences from her childhood in order to better understand their current impact Therapist will provide referrals for additional resources as appropriate  Diagnosis Axis none Adjustment disorder with anxiety and depression (F43.23)   Axis none    Conditions For Discharge Achievement of treatment goals and objectives             Myrtie Cruise, PhD

## 2022-10-22 ENCOUNTER — Ambulatory Visit (INDEPENDENT_AMBULATORY_CARE_PROVIDER_SITE_OTHER): Payer: BC Managed Care – PPO | Admitting: Clinical

## 2022-10-22 DIAGNOSIS — F4323 Adjustment disorder with mixed anxiety and depressed mood: Secondary | ICD-10-CM | POA: Diagnosis not present

## 2022-10-22 NOTE — Progress Notes (Signed)
Time: 10:00am-10:53am CPT Code: [id number] Diagnosis: F43.23  Jazelynn was seen remotely using secure video conferencing. She was in her home and therapist was in her office at the time of the appointment. She shared that she had woken up sick that morning and was not feeling well. Session focused on gathering more information related to recent events in [name]'s life. She provided details of her separation from her wife, as well as of her feeling of numbness following the passing of her grandmother and father. She is scheduled to be seen again in one week.   Treatment Plan Client Abilities/Strengths  Shar described herself as open to listen to insights from others.  Client Treatment Preferences:  Telisa shared that she is ok with appointments during the daytime, but there is a chance she may need to change her appointments to better fit her work schedule. She prefers appointments on Mondays, Wednesdays, or Fridays.  Client Statement of Needs  Serin shared that she is in need of a professional outlet for stress.  Treatment Level  She shared that she would probably benefit from biweekly sessions.   Symptoms  Problems Addressed  Emerita is going through a divorce that marks the end of a 10-year relationship, as well as the losses of several close family members. She described her childhood as "chaotic," with ongoing stress related to her father's health concerns.  Goals 1. Gianni will be provided with support and, when appropriate, strategies to cope with her current situation. Objective Holland will develop coping strategies and increase overall motivation.  Target Date: 10/17/2023 Frequency: Biweekly  Progress: 0 Modality: Individual   Related Interventions Dalynn will be provided with opportunities to process her experiences in session Therapist will help Yamilet to notice and disengage from maladaptive thoughts and behaviors using CBT-based strategies Elvena will have opportunities  to process experiences from her childhood in order to better understand their current impact Therapist will provide referrals for additional resources as appropriate  Diagnosis Axis none Adjustment disorder with anxiety and depression (F43.23)   Axis none    Conditions For Discharge Achievement of treatment goals and objectives             Myrtie Cruise, PhD               Myrtie Cruise, PhD

## 2022-10-23 ENCOUNTER — Telehealth: Payer: BC Managed Care – PPO | Admitting: Physician Assistant

## 2022-10-23 DIAGNOSIS — R6889 Other general symptoms and signs: Secondary | ICD-10-CM | POA: Diagnosis not present

## 2022-10-23 NOTE — Progress Notes (Signed)
I have spent 5 minutes in review of e-visit questionnaire, review and updating patient chart, medical decision making and response to patient.   Cassandra Fear Cody Louanne Calvillo, PA-C    

## 2022-10-23 NOTE — Progress Notes (Signed)
E visit for Flu like symptoms   We are sorry that you are not feeling well.  Here is how we plan to help! Based on what you have shared with me it looks like you may have flu-like symptoms that should be watched but do not seem to indicate anti-viral treatment.  Influenza or "the flu" is   an infection caused by a respiratory virus. The flu virus is highly contagious and persons who did not receive their yearly flu vaccination may "catch" the flu from close contact.   Based upon your symptoms and potential risk factors I recommend that you follow the flu symptoms recommendation that I have listed below.  Please keep well-hydrated and try to get plenty of rest. If you have a humidifier, place it in the bedroom and run it at night. Start a saline nasal rinse for nasal congestion. You can consider use of a nasal steroid spray like Flonase or Nasacort OTC. You can alternate between Tylenol and Ibuprofen if needed for fever, body aches, headache and/or throat pain. Salt water-gargles and chloraseptic spray can be very beneficial for sore throat. Mucinex-DM for congestion or cough. Please take all prescribed medications as directed.  Remain out of work until [employer] for 24 hours without a fever-reducing medication, and you are feeling better.  You should mask until symptoms are resolved.  If anything worsens despite treatment, you need to be evaluated in-person. Please do not delay care.   ANYONE WHO HAS FLU SYMPTOMS SHOULD: Stay home. The flu is highly contagious and going out or to work exposes others! Be sure to drink plenty of fluids. Water is fine as well as fruit juices, sodas and electrolyte beverages. You may want to stay away from caffeine or alcohol. If you are nauseated, try taking small sips of liquids. How do you know if you are getting enough fluid? Your urine should be a pale yellow or almost colorless. Get rest. Taking a steamy shower or using a humidifier may help nasal  congestion and ease sore throat pain. Using a saline nasal spray works much the same way. Cough drops, hard candies and sore throat lozenges may ease your cough. Line up a caregiver. Have someone check on you regularly.   GET HELP RIGHT AWAY IF: You cannot keep down liquids or your medications. You become short of breath Your fell like you are going to pass out or loose consciousness. Your symptoms persist after you have completed your treatment plan MAKE SURE YOU  Understand these instructions. Will watch your condition. Will get help right away if you are not doing well or get worse.  Your e-visit answers were reviewed by a board certified advanced clinical practitioner to complete your personal care plan.  Depending on the condition, your plan could have included both over the counter or prescription medications.  If there is a problem please reply  once you have received a response from your provider.  Your safety is important to [place].  If you have drug allergies check your prescription carefully.    You can use MyChart to ask questions about today's visit, request a non-urgent call back, or ask for a work or school excuse for 24 hours related to this e-Visit. If it has been greater than 24 hours you will need to follow up with your provider, or enter a new e-Visit to address those concerns.  You will get an e-mail in the next two days asking about your experience.  I hope that your e-visit  has been valuable and will speed your recovery. Thank you for using e-visits.

## 2022-10-29 ENCOUNTER — Ambulatory Visit: Payer: BC Managed Care – PPO | Admitting: Clinical

## 2022-10-29 DIAGNOSIS — F4323 Adjustment disorder with mixed anxiety and depressed mood: Secondary | ICD-10-CM

## 2022-10-29 NOTE — Progress Notes (Signed)
Time: 10:00am-10:58am CPT Code: [id number] Diagnosis: F43.23  Cassandra Ibarra was seen remotely using secure video conferencing. She was in her office at work and therapist was in her office at the time of the appointment. Session focused on symptoms of her depression, and working to better understand factors from her near and distant past in order to better understand her current symptamatology. For homework, she will notice how she feels when she tries to take a shower. She is scheduled to be seen again in two weeks.   Treatment Plan Client Abilities/Strengths  Cassandra Ibarra described herself as open to listen to insights from others.  Client Treatment Preferences:  Cassandra Ibarra shared that she is ok with appointments during the daytime, but there is a chance she may need to change her appointments to better fit her work schedule. She prefers appointments on Mondays, Wednesdays, or Fridays.  Client Statement of Needs  Cassandra Ibarra shared that she is in need of a professional outlet for stress.  Treatment Level  She shared that she would probably benefit from biweekly sessions.   Symptoms  Problems Addressed  Cassandra Ibarra is going through a divorce that marks the end of a 10-year relationship, as well as the losses of several close family members. She described her childhood as "chaotic," with ongoing stress related to her father's health concerns.  Goals 1. Cassandra Ibarra will be provided with support and, when appropriate, strategies to cope with her current situation. Objective Cassandra Ibarra will develop coping strategies and increase overall motivation.  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

## 2022-11-12 ENCOUNTER — Ambulatory Visit (INDEPENDENT_AMBULATORY_CARE_PROVIDER_SITE_OTHER): Payer: BC Managed Care – PPO | Admitting: Clinical

## 2022-11-12 DIAGNOSIS — F4323 Adjustment disorder with mixed anxiety and depressed mood: Secondary | ICD-10-CM | POA: Diagnosis not present

## 2022-11-12 NOTE — Progress Notes (Signed)
Time: 9:00am-9:58am CPT Code: [id number] Diagnosis: F43.23  Cassandra Ibarra was seen remotely using secure video conferencing. She was in her office at work and therapist was in her office at the time of the appointment. She had completed her homework of noticing what comes up when she thinks about taking a shower, and had found that her mind quickly moves on to a different subject. She shared that she had had an emotional two weeks during which a friend had attempted suicide and she had coached them into going to the hospital. Session focused on processing Cassandra Ibarra's avoidance as a coping strategy, which she described as a breakthrough. For homework, she will try turning the water on without pressuring herself to get in when she thinks of a shower. She is scheduled to be seen again in two weeks.   Treatment Plan Client Abilities/Strengths  Cassandra Ibarra described herself as open to listen to insights from others.  Client Treatment Preferences:  Cassandra Ibarra shared that she is ok with appointments during the daytime, but there is a chance she may need to change her appointments to better fit her work schedule. She prefers appointments on Mondays, Wednesdays, or Fridays.  Client Statement of Needs  Cassandra Ibarra shared that she is in need of a professional outlet for stress.  Treatment Level  She shared that she would probably benefit from biweekly sessions.   Symptoms  Problems Addressed  Cassandra Ibarra is going through a divorce that marks the end of a 10-year relationship, as well as the losses of several close family members. She described her childhood as "chaotic," with ongoing stress related to her father's health concerns.  Goals 1. Cassandra Ibarra will be provided with support and, when appropriate, strategies to cope with her current situation. Objective Cassandra Ibarra will develop coping strategies and increase overall motivation.  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

## 2022-11-27 ENCOUNTER — Ambulatory Visit: Payer: BC Managed Care – PPO | Admitting: Clinical

## 2022-12-12 ENCOUNTER — Ambulatory Visit (INDEPENDENT_AMBULATORY_CARE_PROVIDER_SITE_OTHER): Payer: BC Managed Care – PPO | Admitting: Clinical

## 2022-12-12 DIAGNOSIS — F4323 Adjustment disorder with mixed anxiety and depressed mood: Secondary | ICD-10-CM | POA: Diagnosis not present

## 2022-12-12 NOTE — Progress Notes (Signed)
Time: 11:00am-11:58am CPT Code: [id number] Diagnosis: F43.23  Zayiah was seen remotely using secure video conferencing. She was in her office at work and therapist was in her home at the time of the appointment. She shared that her mood had improved somewhat, and she had started taking citalopram and adderall. She had been able to take a shower the past week, and had ordered a mat to help address fears of slipping in the shower. Session focused on continuing to process events from her past that may impact her current mood and functioning. She is scheduled to be seen again in two weeks.   Treatment Plan Client Abilities/Strengths  Deara described herself as open to listen to insights from others.  Client Treatment Preferences:  Najai shared that she is ok with appointments during the daytime, but there is a chance she may need to change her appointments to better fit her work schedule. She prefers appointments on Mondays, Wednesdays, or Fridays.  Client Statement of Needs  Caitlin shared that she is in need of a professional outlet for stress.  Treatment Level  She shared that she would probably benefit from biweekly sessions.   Symptoms  Problems Addressed  Myleen is going through a divorce that marks the end of a 10-year relationship, as well as the losses of several close family members. She described her childhood as "chaotic," with ongoing stress related to her father's health concerns.  Goals 1. Uta will be provided with support and, when appropriate, strategies to cope with her current situation. Objective Menda will develop coping strategies and increase overall motivation.  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

## 2022-12-26 ENCOUNTER — Ambulatory Visit: Payer: BC Managed Care – PPO | Admitting: Clinical

## 2022-12-26 NOTE — Progress Notes (Incomplete)
                Cassandra Ibarra L Darrall Strey, PhD 

## 2023-01-01 ENCOUNTER — Ambulatory Visit (INDEPENDENT_AMBULATORY_CARE_PROVIDER_SITE_OTHER): Payer: BC Managed Care – PPO | Admitting: Clinical

## 2023-01-01 DIAGNOSIS — F4323 Adjustment disorder with mixed anxiety and depressed mood: Secondary | ICD-10-CM

## 2023-01-01 NOTE — Progress Notes (Signed)
Time: 10:00am-10:58am CPT Code: [id number] Diagnosis: F43.23  Cassandra Ibarra was seen remotely using secure video conferencing. She was in her office at work and therapist was in her home at the time of the appointment. She reflected upon continued difficulty with self-care, and therapist pointed out a tendency toward negative self talk. Therapist suggested noticing and challenging the language she uses to talk about herself, creating an enjoyable hygiene ritual, and setting a timer for 5 minutes to clean each day. Therapist also suggested further exploring how past traumas may have contributed to current feelings of shame and guilt, as well as possible latent anger. She is scheduled to be seen again in two weeks.   Treatment Plan Client Abilities/Strengths  Cassandra Ibarra described herself as open to listen to insights from others.  Client Treatment Preferences:  Cassandra Ibarra shared that she is ok with appointments during the daytime, but there is a chance she may need to change her appointments to better fit her work schedule. She prefers appointments on Mondays, Wednesdays, or Fridays.  Client Statement of Needs  Cassandra Ibarra shared that she is in need of a professional outlet for stress.  Treatment Level  She shared that she would probably benefit from biweekly sessions.   Symptoms  Problems Addressed  Cassandra Ibarra is going through a divorce that marks the end of a 10-year relationship, as well as the losses of several close family members. She described her childhood as "chaotic," with ongoing stress related to her father's health concerns.  Goals 1. Cassandra Ibarra will be provided with support and, when appropriate, strategies to cope with her current situation. Objective Cassandra Ibarra will develop coping strategies and increase overall motivation.  Target Date: 10/17/2023 Frequency: Biweekly  Progress: 0 Modality: Individual   Related Interventions Cassandra Ibarra will be provided with opportunities to process her experiences in  session Therapist will help Cassandra Ibarra to notice and disengage from maladaptive thoughts and behaviors using CBT-based strategies Cassandra Ibarra will have opportunities to process experiences from her childhood in order to better understand their current impact Therapist will provide referrals for additional resources as appropriate  Diagnosis Axis none Adjustment disorder with anxiety and depression (F43.23)   Axis none    Conditions For Discharge Achievement of treatment goals and objectives      Cassandra Noa, Cassandra Ibarra               Cassandra Noa, Cassandra Ibarra

## 2023-01-09 ENCOUNTER — Telehealth: Payer: BC Managed Care – PPO | Admitting: Physician Assistant

## 2023-01-09 DIAGNOSIS — J019 Acute sinusitis, unspecified: Secondary | ICD-10-CM

## 2023-01-09 DIAGNOSIS — B9689 Other specified bacterial agents as the cause of diseases classified elsewhere: Secondary | ICD-10-CM | POA: Diagnosis not present

## 2023-01-09 MED ORDER — AMOXICILLIN-POT CLAVULANATE 875-125 MG PO TABS: 1.0000 | ORAL_TABLET | Freq: Two times a day (BID) | ORAL | 0 refills | Status: DC

## 2023-01-09 NOTE — Progress Notes (Signed)

## 2023-01-30 ENCOUNTER — Ambulatory Visit: Payer: BC Managed Care – PPO | Admitting: Clinical

## 2023-01-30 NOTE — Progress Notes (Incomplete)
                Cassandra Ibarra Cassandra Amneet Cendejas, PhD 

## 2023-02-11 ENCOUNTER — Other Ambulatory Visit (HOSPITAL_COMMUNITY): Payer: Self-pay

## 2023-02-11 MED ORDER — ZEPBOUND 15 MG/0.5ML ~~LOC~~ SOAJ
15.0000 mg | SUBCUTANEOUS | 0 refills | Status: DC
  Filled 2023-02-11 – 2023-08-21 (×2): qty 2, 28d supply, fill #0

## 2023-02-17 ENCOUNTER — Ambulatory Visit: Payer: BC Managed Care – PPO | Admitting: Clinical

## 2023-02-19 ENCOUNTER — Other Ambulatory Visit (HOSPITAL_COMMUNITY): Payer: Self-pay

## 2023-03-03 ENCOUNTER — Ambulatory Visit (INDEPENDENT_AMBULATORY_CARE_PROVIDER_SITE_OTHER): Payer: BC Managed Care – PPO | Admitting: Clinical

## 2023-03-03 DIAGNOSIS — F4323 Adjustment disorder with mixed anxiety and depressed mood: Secondary | ICD-10-CM | POA: Diagnosis not present

## 2023-03-03 NOTE — Progress Notes (Signed)
Time: 11:00am-11:58am CPT Code: [id number] Diagnosis: F43.23  Nel was seen remotely using secure video conferencing. She was in her home at work and therapist was in her home at the time of the appointment. Client is aware of risks of telehealth and consented to a virtual visit. She reported an improvement in overall mood since the end of the school year, as well as additional time to process grief related to her father's death. Session focused on identifying coping strategies, as well as processing events in her relationship. She is scheduled to be seen again in one month.   Treatment Plan Client Abilities/Strengths  Josett described herself as open to listen to insights from others.  Client Treatment Preferences:  Wannie shared that she is ok with appointments during the daytime, but there is a chance she may need to change her appointments to better fit her work schedule. She prefers appointments on Mondays, Wednesdays, or Fridays.  Client Statement of Needs  Leota shared that she is in need of a professional outlet for stress.  Treatment Level  She shared that she would probably benefit from biweekly sessions.   Symptoms  Problems Addressed  Kent is going through a divorce that marks the end of a 10-year relationship, as well as the losses of several close family members. She described her childhood as "chaotic," with ongoing stress related to her father's health concerns.  Goals 1. Yonna will be provided with support and, when appropriate, strategies to cope with her current situation. Objective Criss will develop coping strategies and increase overall motivation.  Target Date: 10/17/2023 Frequency: Biweekly  Progress: 0 Modality: Individual   Related Interventions Lameca will be provided with opportunities to process her experiences in session Therapist will help Zivah to notice and disengage from maladaptive thoughts and behaviors using CBT-based  strategies Noble will have opportunities to process experiences from her childhood in order to better understand their current impact Therapist will provide referrals for additional resources as appropriate  Diagnosis Axis none Adjustment disorder with anxiety and depression (F43.23)   Axis none    Conditions For Discharge Achievement of treatment goals and objectives       [name], PhD               [name], PhD

## 2023-03-20 ENCOUNTER — Encounter (HOSPITAL_COMMUNITY): Payer: Self-pay

## 2023-03-20 ENCOUNTER — Other Ambulatory Visit (HOSPITAL_COMMUNITY): Payer: Self-pay

## 2023-03-23 ENCOUNTER — Other Ambulatory Visit (HOSPITAL_COMMUNITY): Payer: Self-pay

## 2023-03-23 MED ORDER — ZEPBOUND 15 MG/0.5ML ~~LOC~~ SOAJ
SUBCUTANEOUS | 0 refills | Status: DC
  Filled 2023-03-23 – 2023-05-08 (×2): qty 2, 28d supply, fill #0

## 2023-03-31 ENCOUNTER — Other Ambulatory Visit (HOSPITAL_COMMUNITY): Payer: Self-pay

## 2023-04-08 ENCOUNTER — Ambulatory Visit: Payer: BC Managed Care – PPO | Admitting: Clinical

## 2023-04-10 ENCOUNTER — Ambulatory Visit: Payer: BC Managed Care – PPO | Admitting: Clinical

## 2023-04-15 ENCOUNTER — Ambulatory Visit: Payer: BC Managed Care – PPO | Admitting: Clinical

## 2023-05-06 ENCOUNTER — Ambulatory Visit (INDEPENDENT_AMBULATORY_CARE_PROVIDER_SITE_OTHER): Payer: BC Managed Care – PPO | Admitting: Clinical

## 2023-05-06 DIAGNOSIS — F4323 Adjustment disorder with mixed anxiety and depressed mood: Secondary | ICD-10-CM | POA: Diagnosis not present

## 2023-05-06 NOTE — Progress Notes (Signed)
Time: 10:00am-10:58am CPT Code: [id number] Diagnosis: F43.23  Cassandra Ibarra was seen remotely using secure video conferencing. She was in her home at work and therapist was in her home at the time of the appointment. Client is aware of risks of telehealth and consented to a virtual visit. She reported upon major changes since her last session, including her ex moving out, and efforts she had made with family to clean out the house. She reported that her house had been in a far worse state than she had let on, and cleaning it has been a relief. Session focused on processing dynamics in her marriage. Therapist encouraged her to explore whether the early years of her marriage were actually as happy as she remembers, and whether she may have carried relationship dynamics from her traumatic childhood into the relationship. She is scheduled to be seen again in two weeks.    Treatment Plan Client Abilities/Strengths  Cassandra Ibarra described herself as open to listen to insights from others.  Client Treatment Preferences:  Cassandra Ibarra shared that she is ok with appointments during the daytime, but there is a chance she may need to change her appointments to better fit her work schedule. She prefers appointments on Mondays, Wednesdays, or Fridays.  Client Statement of Needs  Cassandra Ibarra shared that she is in need of a professional outlet for stress.  Treatment Level  She shared that she would probably benefit from biweekly sessions.   Symptoms  Problems Addressed  Cassandra Ibarra is going through a divorce that marks the end of a 10-year relationship, as well as the losses of several close family members. She described her childhood as "chaotic," with ongoing stress related to her father's health concerns.  Goals 1. Cassandra Ibarra will be provided with support and, when appropriate, strategies to cope with her current situation. Objective Cassandra Ibarra will develop coping strategies and increase overall motivation.  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

## 2023-05-08 ENCOUNTER — Other Ambulatory Visit (HOSPITAL_COMMUNITY): Payer: Self-pay

## 2023-05-13 ENCOUNTER — Other Ambulatory Visit (HOSPITAL_COMMUNITY): Payer: Self-pay

## 2023-05-20 ENCOUNTER — Ambulatory Visit (INDEPENDENT_AMBULATORY_CARE_PROVIDER_SITE_OTHER): Payer: BC Managed Care – PPO | Admitting: Clinical

## 2023-05-20 DIAGNOSIS — F4323 Adjustment disorder with mixed anxiety and depressed mood: Secondary | ICD-10-CM
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
## 2023-05-27 ENCOUNTER — Ambulatory Visit: Payer: BC Managed Care – PPO | Admitting: Clinical

## 2023-05-27 NOTE — Progress Notes (Unsigned)
[name], PhD

## 2023-06-15 ENCOUNTER — Ambulatory Visit (INDEPENDENT_AMBULATORY_CARE_PROVIDER_SITE_OTHER): Payer: BC Managed Care – PPO | Admitting: Clinical

## 2023-06-15 DIAGNOSIS — F4323 Adjustment disorder with mixed anxiety and depressed mood: Secondary | ICD-10-CM

## 2023-06-15 NOTE — Progress Notes (Signed)
Time: 9:00am-9:55 am CPT Code: 16109U-04 Diagnosis: F43.23  Cassandra Ibarra was seen remotely using secure video conferencing. She was in her office at work and therapist was in her home at the time of the appointment. Client is aware of risks of telehealth and consented to a virtual visit. Cassandra Ibarra reported sustained improvements in mood, and that steps had been taken toward finalizing her separation agreement with her ex. She has also enjoyed improving her diet since going on weight loss medication. Session focused on better understanding her relationship with food and her body. She also reflected upon the experience of learning that her ex was going to transition, and therapist suggested continuing to unpack this in her next session. She is scheduled to be seen again in one week.    Treatment Plan Client Abilities/Strengths  Cassandra Ibarra described herself as open to listen to insights from others.  Client Treatment Preferences:  Cassandra Ibarra shared that she is ok with appointments during the daytime, but there is a chance she may need to change her appointments to better fit her work schedule. She prefers appointments on Mondays, Wednesdays, or Fridays.  Client Statement of Needs  Cassandra Ibarra shared that she is in need of a professional outlet for stress.  Treatment Level  She shared that she would probably benefit from biweekly sessions.   Symptoms  Problems Addressed  Cassandra Ibarra is going through a divorce that marks the end of a 10-year relationship, as well as the losses of several close family members. She described her childhood as "chaotic," with ongoing stress related to her father's health concerns.  Goals 1. Cassandra Ibarra will be provided with support and, when appropriate, strategies to cope with her current situation. Objective Cassandra Ibarra will develop coping strategies and increase overall motivation.  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

## 2023-06-18 ENCOUNTER — Encounter: Payer: Self-pay | Admitting: Family Medicine

## 2023-06-18 ENCOUNTER — Telehealth: Payer: BC Managed Care – PPO | Admitting: Family Medicine

## 2023-06-18 DIAGNOSIS — H60502 Unspecified acute noninfective otitis externa, left ear: Secondary | ICD-10-CM

## 2023-06-18 MED ORDER — CIPROFLOXACIN-DEXAMETHASONE 0.3-0.1 % OT SUSP: 4.0000 [drp] | Freq: Two times a day (BID) | OTIC | 0 refills | Status: DC

## 2023-06-18 NOTE — Progress Notes (Signed)
E Visit for Ear Pain Otitis Externa  We are sorry that you are not feeling well. Here is how we plan to help!  Based on what you have shared with me it looks like you have outer ear infection. It is a redness or swelling, irritation, or infection of your outer ear canal. Your ear canal is a tube that goes from the opening of the ear to the eardrum.  When water stays in your ear canal, germs can grow.  The usual symptoms include:    Itchiness inside the ear  Redness or a sense of swelling in the ear  Pain when the ear is tugged on when pressure is placed on the ear  Pus draining from the infected ear   I have prescribed Ciprodex ear drops uses as directed, for 7 days.   Your symptoms should improve over the next 3 days and should resolve in about 7 days.  Be sure to complete ALL of your prescription.  HOME CARE: Wash your hands frequently. If you are prescribed an ear drop, do not place the tip of the bottle on your ear or touch it with your fingers. You can take Acetaminophen 650 mg every 4-6 hours as needed for pain.  If pain is severe or moderate, you can apply a heating pad (set on low) or hot water bottle (wrapped in a towel) to outer ear for 20 minutes.  This will also increase drainage. Avoid ear plugs Do not go swimming until the symptoms are gone Do not use Q-tips After showers, help the water run out by tilting your head to one side.   GET HELP RIGHT AWAY IF: Fever is over 102.2 degrees. You develop progressive ear pain or hearing loss. Ear symptoms persist longer than 3 days after treatment.  MAKE SURE YOU: Understand these instructions. Will watch your condition. Will get help right away if you are not doing well or get worse.  TO PREVENT SWIMMER'S EAR: Use a bathing cap or custom fitted swim molds to keep your ears dry. Towel off after swimming to dry your ears. Tilt your head or pull your earlobes to allow the water to escape your ear canal. If there is still  water in your ears, consider using a hairdryer on the lowest setting.  Thank you for choosing an e-visit.  Your e-visit answers were reviewed by a board certified advanced clinical practitioner to complete your personal care plan. Depending upon the condition, your plan could have included both over the counter or prescription medications.  Please review your pharmacy choice. Make sure the pharmacy is open so you can pick up the prescription now. If there is a problem, you may contact your provider through [employer] and have the prescription routed to another pharmacy.  Your safety is important to [place]. If you have drug allergies check your prescription carefully.   For the next 24 hours you can use MyChart to ask questions about today's visit, request a non-urgent call back, or ask for a work or school excuse. You will get an email with a survey after your eVisit asking about your experience. We would appreciate your feedback. I hope that your e-visit has been valuable and will aid in your recovery.  I provided 5 minutes of non face-to-face time during this encounter for chart review, medication and order placement, as well as and documentation.

## 2023-06-23 ENCOUNTER — Ambulatory Visit (INDEPENDENT_AMBULATORY_CARE_PROVIDER_SITE_OTHER): Payer: BC Managed Care – PPO | Admitting: Clinical

## 2023-06-23 DIAGNOSIS — F4323 Adjustment disorder with mixed anxiety and depressed mood: Secondary | ICD-10-CM

## 2023-06-23 NOTE — Progress Notes (Signed)
Time: 10:00am-10:55 am CPT Code: [id number] Diagnosis: F43.23  Ziah was seen remotely using secure video conferencing. She was in her office at work and therapist was in her home at the time of the appointment. Client is aware of risks of telehealth and consented to a virtual visit. Georgiann Hahn reported feelings of overwhelm related to her current workload, but has nonetheless been able to maintain healthy habits. She had signed her separation agreement the previous week, and session focused on continuing to process dynamics in her marriage. Therapist encouraged her to consider what she would want to be different in a subsequent relationships, and what patterns she would rather not repeat. She is scheduled to be seen again in two weeks.   Treatment Plan Client Abilities/Strengths  Maxene described herself as open to listen to insights from others.  Client Treatment Preferences:  Keesha shared that she is ok with appointments during the daytime, but there is a chance she may need to change her appointments to better fit her work schedule. She prefers appointments on Mondays, Wednesdays, or Fridays.  Client Statement of Needs  Arhonda shared that she is in need of a professional outlet for stress.  Treatment Level  She shared that she would probably benefit from biweekly sessions.   Symptoms  Problems Addressed  Jonise is going through a divorce that marks the end of a 10-year relationship, as well as the losses of several close family members. She described her childhood as "chaotic," with ongoing stress related to her father's health concerns.  Goals 1. Emmalyne will be provided with support and, when appropriate, strategies to cope with her current situation. Objective Fatou will develop coping strategies and increase overall motivation.  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

## 2023-07-01 ENCOUNTER — Ambulatory Visit: Payer: BC Managed Care – PPO | Admitting: Clinical

## 2023-07-01 DIAGNOSIS — F4323 Adjustment disorder with mixed anxiety and depressed mood: Secondary | ICD-10-CM

## 2023-07-01 NOTE — Progress Notes (Signed)
Time: 9:00am-9:58 am CPT Code: 28413K-44 Diagnosis: F43.23  Cassandra Ibarra was seen remotely using secure video conferencing. She was in her office at work and therapist was in her  at office time of the appointment. Client is aware of risks of telehealth and consented to a virtual visit. She reported a stressful time at work, and therapist processed with her what are essential self-care practices to maintain during busy times. She also continued to process her relationship, and had completed her homework of considering what she might want to be different in a new relationship. She reported that she recently entered into a new relationship, and already has intentionally engaged in more open communication. She is scheduled to be seen again in one week.   Treatment Plan Client Abilities/Strengths  Cassandra Ibarra described herself as open to listen to insights from others.  Client Treatment Preferences:  Cassandra Ibarra shared that she is ok with appointments during the daytime, but there is a chance she may need to change her appointments to better fit her work schedule. She prefers appointments on Mondays, Wednesdays, or Fridays.  Client Statement of Needs  Cassandra Ibarra shared that she is in need of a professional outlet for stress.  Treatment Level  She shared that she would probably benefit from biweekly sessions.   Symptoms  Problems Addressed  Cassandra Ibarra is going through a divorce that marks the end of a 10-year relationship, as well as the losses of several close family members. She described her childhood as "chaotic," with ongoing stress related to her father's health concerns.  Goals 1. Cassandra Ibarra will be provided with support and, when appropriate, strategies to cope with her current situation. Objective Cassandra Ibarra will develop coping strategies and increase overall motivation.  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

## 2023-07-08 ENCOUNTER — Ambulatory Visit: Payer: BC Managed Care – PPO | Admitting: Clinical

## 2023-07-08 DIAGNOSIS — F4323 Adjustment disorder with mixed anxiety and depressed mood: Secondary | ICD-10-CM | POA: Diagnosis not present

## 2023-07-08 NOTE — Progress Notes (Signed)
Time: 10:00am-10:54 am CPT Code: [id number] Diagnosis: F43.23  Cassandra Ibarra was seen remotely using secure video conferencing. She was in her office at work and therapist was in her  at office time of the appointment. Client is aware of risks of telehealth and consented to a virtual visit. Session focused on processing the results of the election, as well as recent stressors that had arisen. Therapist engaged her in discussion of which are the "glass balls," and which are the "rubber balls," and how she can let go of what does not absolutely need to be done and engage in self care. She is scheduled to be seen again in two weeks.   Treatment Plan Client Abilities/Strengths  Cassandra Ibarra described herself as open to listen to insights from others.  Client Treatment Preferences:  Cassandra Ibarra shared that she is ok with appointments during the daytime, but there is a chance she may need to change her appointments to better fit her work schedule. She prefers appointments on Mondays, Wednesdays, or Fridays.  Client Statement of Needs  Cassandra Ibarra shared that she is in need of a professional outlet for stress.  Treatment Level  She shared that she would probably benefit from biweekly sessions.   Symptoms  Problems Addressed  Cassandra Ibarra is going through a divorce that marks the end of a 10-year relationship, as well as the losses of several close family members. She described her childhood as "chaotic," with ongoing stress related to her father's health concerns.  Goals 1. Cassandra Ibarra will be provided with support and, when appropriate, strategies to cope with her current situation. Objective Cassandra Ibarra will develop coping strategies and increase overall motivation.  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

## 2023-07-15 ENCOUNTER — Ambulatory Visit: Payer: BC Managed Care – PPO | Admitting: Clinical

## 2023-07-15 DIAGNOSIS — F4323 Adjustment disorder with mixed anxiety and depressed mood: Secondary | ICD-10-CM

## 2023-07-15 NOTE — Progress Notes (Signed)
Time: 9:00am-9:54 am CPT Code: 64403K-74 Diagnosis: F43.23  Cassandra Ibarra was seen remotely using secure video conferencing. She was in her office at work and therapist was in her  at office time of the appointment. Client is aware of risks of telehealth and consented to a virtual visit. Cassandra Ibarra reported that she had unexpectedly had to put her cat down the last week. Session focused on processing her feelings about this. She had been successful in maintaining self-care habits despite grief, with the help of her mother. Session included discussion of her relationship with her mother, from past to present. Therapist encouraged her to explore any beliefs she may carry from childhood, especially those that may no longer serve her. She is scheduled to be seen again in one week.   Treatment Plan Client Abilities/Strengths  Cassandra Ibarra described herself as open to listen to insights from others.  Client Treatment Preferences:  Cassandra Ibarra shared that she is ok with appointments during the daytime, but there is a chance she may need to change her appointments to better fit her work schedule. She prefers appointments on Mondays, Wednesdays, or Fridays.  Client Statement of Needs  Cassandra Ibarra shared that she is in need of a professional outlet for stress.  Treatment Level  She shared that she would probably benefit from biweekly sessions.   Symptoms  Problems Addressed  Cassandra Ibarra is going through a divorce that marks the end of a 10-year relationship, as well as the losses of several close family members. She described her childhood as "chaotic," with ongoing stress related to her father's health concerns.  Goals 1. Cassandra Ibarra will be provided with support and, when appropriate, strategies to cope with her current situation. Objective Cassandra Ibarra will develop coping strategies and increase overall motivation.  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

## 2023-07-21 ENCOUNTER — Ambulatory Visit: Payer: BC Managed Care – PPO | Admitting: Clinical

## 2023-07-21 DIAGNOSIS — F4323 Adjustment disorder with mixed anxiety and depressed mood: Secondary | ICD-10-CM

## 2023-07-21 NOTE — Progress Notes (Signed)
Time: 10:00am-10:54 am CPT Code: 63875I-43 Diagnosis: F43.23  Cassandra Ibarra was seen remotely using secure video conferencing. She was in her office at work and therapist was in her  at office time of the appointment. Client is aware of risks of telehealth and consented to a virtual visit. [name] joined session a few minutes late due to a hectic schedule, but therapist was present at the start of session and reached out via phone to remind her. She reported a good week during which she had had a productive visit with Dr. Earlene Plater. She continues to feel optimistic about her weight loss and to enjoy her new relationship. She described a goal for the week as allowing herself to enjoy thanksgiving. She is scheduled to be seen again in two weeks.   Treatment Plan Client Abilities/Strengths  [name] described herself as open to listen to insights from others.  Client Treatment Preferences:  [name] shared that she is ok with appointments during the daytime, but there is a chance she may need to change her appointments to better fit her work schedule. She prefers appointments on Mondays, Wednesdays, or Fridays.  Client Statement of Needs  [name] shared that she is in need of a professional outlet for stress.  Treatment Level  She shared that she would probably benefit from biweekly sessions.   Symptoms  Problems Addressed  [name] is going through a divorce that marks the end of a 10-year relationship, as well as the losses of several close family members. She described her childhood as "chaotic," with ongoing stress related to her father's health concerns.  Goals 1. [name] will be provided with support and, when appropriate, strategies to cope with her current situation. Objective [name] will develop coping strategies and increase overall motivation.  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

## 2023-08-02 ENCOUNTER — Telehealth: Payer: BC Managed Care – PPO | Admitting: Family

## 2023-08-02 DIAGNOSIS — J019 Acute sinusitis, unspecified: Secondary | ICD-10-CM

## 2023-08-02 MED ORDER — AMOXICILLIN-POT CLAVULANATE 875-125 MG PO TABS: 1.0000 | ORAL_TABLET | Freq: Two times a day (BID) | ORAL | 0 refills | Status: DC

## 2023-08-02 NOTE — Progress Notes (Signed)
E-Visit for Sinus Problems  We are sorry that you are not feeling well.  Here is how we plan to help!  Based on what you have shared with me it looks like you have sinusitis.  Sinusitis is inflammation and infection in the sinus cavities of the head.  Based on your presentation I believe you most likely have Acute Bacterial Sinusitis.  This is an infection caused by bacteria and is treated with antibiotics. I have prescribed Augmentin 875mg/125mg one tablet twice daily with food, for 7 days. You may use an oral decongestant such as Mucinex D or if you have glaucoma or high blood pressure use plain Mucinex. Saline nasal spray help and can safely be used as often as needed for congestion.  If you develop worsening sinus pain, fever or notice severe headache and vision changes, or if symptoms are not better after completion of antibiotic, please schedule an appointment with a health care provider.    Sinus infections are not as easily transmitted as other respiratory infection, however we still recommend that you avoid close contact with loved ones, especially the very young and elderly.  Remember to wash your hands thoroughly throughout the day as this is the number one way to prevent the spread of infection!  Home Care: Only take medications as instructed by your medical team. Complete the entire course of an antibiotic. Do not take these medications with alcohol. A steam or ultrasonic humidifier can help congestion.  You can place a towel over your head and breathe in the steam from hot water coming from a faucet. Avoid close contacts especially the very young and the elderly. Cover your mouth when you cough or sneeze. Always remember to wash your hands.  Get Help Right Away If: You develop worsening fever or sinus pain. You develop a severe head ache or visual changes. Your symptoms persist after you have completed your treatment plan.  Make sure you Understand these instructions. Will watch  your condition. Will get help right away if you are not doing well or get worse.  Thank you for choosing an e-visit.  Your e-visit answers were reviewed by a board certified advanced clinical practitioner to complete your personal care plan. Depending upon the condition, your plan could have included both over the counter or prescription medications.  Please review your pharmacy choice. Make sure the pharmacy is open so you can pick up prescription now. If there is a problem, you may contact your provider through MyChart messaging and have the prescription routed to another pharmacy.  Your safety is important to us. If you have drug allergies check your prescription carefully.   For the next 24 hours you can use MyChart to ask questions about today's visit, request a non-urgent call back, or ask for a work or school excuse. You will get an email in the next two days asking about your experience. I hope that your e-visit has been valuable and will speed your recovery.   Approximately 5 minutes was spent documenting and reviewing patient's chart.   

## 2023-08-07 ENCOUNTER — Ambulatory Visit: Payer: BC Managed Care – PPO | Admitting: Clinical

## 2023-08-14 ENCOUNTER — Ambulatory Visit: Payer: BC Managed Care – PPO | Admitting: Clinical

## 2023-08-14 DIAGNOSIS — F4323 Adjustment disorder with mixed anxiety and depressed mood: Secondary | ICD-10-CM | POA: Diagnosis not present

## 2023-08-14 NOTE — Progress Notes (Signed)
Time: 10:00am-10:54 am CPT Code: 34742V-95 Diagnosis: F43.23  Cassandra Ibarra was seen remotely using secure video conferencing. She was in her office at work and therapist was in her  at office time of the appointment. Client is aware of risks of telehealth and consented to a virtual visit. She reported a good few weeks during which she had enjoyed showering regularly and had invested in several self-care items (e.g., face mask, lip mask), as well as used them. Cassandra Ibarra also reported complicated emotions, including a sense of feeling alone at the holidays. Therapist processed this with her, offering validation and support. For homework, she will continue practicing self-care. She is scheduled to be seen again in two weeks.   Treatment Plan Client Abilities/Strengths  Cassandra Ibarra described herself as open to listen to insights from others.  Client Treatment Preferences:  Cassandra Ibarra shared that she is ok with appointments during the daytime, but there is a chance she may need to change her appointments to better fit her work schedule. She prefers appointments on Mondays, Wednesdays, or Fridays.  Client Statement of Needs  Cassandra Ibarra shared that she is in need of a professional outlet for stress.  Treatment Level  She shared that she would probably benefit from biweekly sessions.   Symptoms  Problems Addressed  Cassandra Ibarra is going through a divorce that marks the end of a 10-year relationship, as well as the losses of several close family members. She described her childhood as "chaotic," with ongoing stress related to her father's health concerns.  Goals 1. Cassandra Ibarra will be provided with support and, when appropriate, strategies to cope with her current situation. Objective Cassandra Ibarra will develop coping strategies and increase overall motivation.  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

## 2023-08-19 ENCOUNTER — Other Ambulatory Visit (HOSPITAL_COMMUNITY): Payer: Self-pay

## 2023-08-20 ENCOUNTER — Other Ambulatory Visit (HOSPITAL_COMMUNITY): Payer: Self-pay

## 2023-08-20 ENCOUNTER — Ambulatory Visit (INDEPENDENT_AMBULATORY_CARE_PROVIDER_SITE_OTHER): Payer: BC Managed Care – PPO | Admitting: Clinical

## 2023-08-20 DIAGNOSIS — F4323 Adjustment disorder with mixed anxiety and depressed mood: Secondary | ICD-10-CM

## 2023-08-20 NOTE — Progress Notes (Signed)
Time: 10:00am-10:54 am CPT Code: 40981X-91 Diagnosis: F43.23  Georgiann Hahn was seen remotely using secure video conferencing. She was in her office at work and therapist was in her  at office time of the appointment. Client is aware of risks of telehealth and consented to a virtual visit. She reported that her mood continues to be stable, and she is looking forward to the holiday break. Session focused on considering how she can prioritize self-care. She is scheduled to be seen again in three weeks.   Treatment Plan Client Abilities/Strengths  Makiba described herself as open to listen to insights from others.  Client Treatment Preferences:  Modesta shared that she is ok with appointments during the daytime, but there is a chance she may need to change her appointments to better fit her work schedule. She prefers appointments on Mondays, Wednesdays, or Fridays.  Client Statement of Needs  Derrian shared that she is in need of a professional outlet for stress.  Treatment Level  She shared that she would probably benefit from biweekly sessions.   Symptoms  Problems Addressed  Timekia is going through a divorce that marks the end of a 10-year relationship, as well as the losses of several close family members. She described her childhood as "chaotic," with ongoing stress related to her father's health concerns.  Goals 1. Ova will be provided with support and, when appropriate, strategies to cope with her current situation. Objective Glennys will develop coping strategies and increase overall motivation.  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

## 2023-08-21 ENCOUNTER — Other Ambulatory Visit (HOSPITAL_COMMUNITY): Payer: Self-pay

## 2023-08-22 ENCOUNTER — Other Ambulatory Visit (HOSPITAL_COMMUNITY): Payer: Self-pay

## 2023-08-22 MED ORDER — ZEPBOUND 15 MG/0.5ML ~~LOC~~ SOAJ
15.0000 mg | SUBCUTANEOUS | 0 refills | Status: DC
  Filled 2023-08-22: qty 2, 28d supply, fill #0

## 2023-09-04 ENCOUNTER — Encounter (HOSPITAL_COMMUNITY): Payer: Self-pay

## 2023-09-04 ENCOUNTER — Emergency Department (HOSPITAL_COMMUNITY): Payer: 59

## 2023-09-04 ENCOUNTER — Other Ambulatory Visit: Payer: Self-pay

## 2023-09-04 ENCOUNTER — Emergency Department (HOSPITAL_COMMUNITY)
Admission: EM | Admit: 2023-09-04 | Discharge: 2023-09-04 | Disposition: A | Discharge status: Home / Self Care | Payer: 59 | Attending: Emergency Medicine | Admitting: Emergency Medicine

## 2023-09-04 DIAGNOSIS — E282 Polycystic ovarian syndrome: Secondary | ICD-10-CM | POA: Insufficient documentation

## 2023-09-04 DIAGNOSIS — Z6841 Body Mass Index (BMI) 40.0 and over, adult: Secondary | ICD-10-CM | POA: Diagnosis not present

## 2023-09-04 DIAGNOSIS — R519 Headache, unspecified: Secondary | ICD-10-CM | POA: Diagnosis present

## 2023-09-04 DIAGNOSIS — F429 Obsessive-compulsive disorder, unspecified: Secondary | ICD-10-CM | POA: Insufficient documentation

## 2023-09-04 DIAGNOSIS — G932 Benign intracranial hypertension: Secondary | ICD-10-CM | POA: Diagnosis not present

## 2023-09-04 DIAGNOSIS — H6121 Impacted cerumen, right ear: Secondary | ICD-10-CM | POA: Diagnosis not present

## 2023-09-04 DIAGNOSIS — H4710 Unspecified papilledema: Secondary | ICD-10-CM | POA: Insufficient documentation

## 2023-09-04 DIAGNOSIS — K219 Gastro-esophageal reflux disease without esophagitis: Secondary | ICD-10-CM | POA: Insufficient documentation

## 2023-09-04 DIAGNOSIS — Z79899 Other long term (current) drug therapy: Secondary | ICD-10-CM | POA: Diagnosis not present

## 2023-09-04 HISTORY — DX: Gastro-esophageal reflux disease without esophagitis: K21.9

## 2023-09-04 HISTORY — DX: Attention-deficit hyperactivity disorder, unspecified type: F90.9

## 2023-09-04 HISTORY — DX: Unspecified papilledema: H47.10

## 2023-09-04 HISTORY — DX: Fibromyalgia: M79.7

## 2023-09-04 LAB — CBC
HCT: 41.5 % (ref 36.0–46.0)
Hemoglobin: 13.3 g/dL (ref 12.0–15.0)
MCH: 27.3 pg (ref 26.0–34.0)
MCHC: 32 g/dL (ref 30.0–36.0)
MCV: 85 fL (ref 80.0–100.0)
Platelets: 288 10*3/uL (ref 150–400)
RBC: 4.88 MIL/uL (ref 3.87–5.11)
RDW: 13.3 % (ref 11.5–15.5)
WBC: 7 10*3/uL (ref 4.0–10.5)
nRBC: 0 % (ref 0.0–0.2)

## 2023-09-04 LAB — BASIC METABOLIC PANEL
Anion gap: 11 (ref 5–15)
BUN: 8 mg/dL (ref 6–20)
CO2: 25 mmol/L (ref 22–32)
Calcium: 9 mg/dL (ref 8.9–10.3)
Chloride: 105 mmol/L (ref 98–111)
Creatinine, Ser: 0.74 mg/dL (ref 0.44–1.00)
GFR, Estimated: >60 mL/min (ref >60)
Glucose, Bld: 88 mg/dL (ref 70–99)
Potassium: 3.8 mmol/L (ref 3.5–5.1)
Sodium: 141 mmol/L (ref 135–145)

## 2023-09-04 LAB — MENINGITIS/ENCEPHALITIS PANEL (CSF)

## 2023-09-04 LAB — PROTEIN, CSF: Total  Protein, CSF: 17 mg/dL (ref 15–45)

## 2023-09-04 LAB — PROTEIN AND GLUCOSE, CSF
Glucose, CSF: 51 mg/dL (ref 40–70)
Total  Protein, CSF: 17 mg/dL (ref 15–45)

## 2023-09-04 MED ORDER — DIPHENHYDRAMINE HCL 50 MG/ML IJ SOLN
25.0000 mg | Freq: Once | INTRAMUSCULAR | Status: AC
  Administered 2023-09-04: 25 mg via INTRAVENOUS
  Filled 2023-09-04: qty 0.5
  Filled 2023-09-04: qty 1

## 2023-09-04 MED ORDER — METOCLOPRAMIDE HCL 5 MG/ML IJ SOLN
10.0000 mg | Freq: Once | INTRAMUSCULAR | Status: AC
  Administered 2023-09-04: 10 mg via INTRAVENOUS
  Filled 2023-09-04: qty 2

## 2023-09-04 MED ORDER — FOLIC ACID 1 MG PO TABS
1.0000 mg | ORAL_TABLET | Freq: Every day | ORAL | Status: DC
  Administered 2023-09-04: 1 mg via ORAL
  Filled 2023-09-04: qty 1

## 2023-09-04 MED ORDER — IOHEXOL 350 MG/ML SOLN
75.0000 mL | Freq: Once | INTRAVENOUS | Status: AC | PRN
  Administered 2023-09-04: 75 mL via INTRAVENOUS

## 2023-09-04 MED ORDER — METOCLOPRAMIDE HCL 10 MG PO TABS: 10.0000 mg | ORAL_TABLET | Freq: Four times a day (QID) | ORAL | 0 refills | Status: DC

## 2023-09-04 MED ORDER — ACETAZOLAMIDE SODIUM 500 MG IJ SOLR
500.0000 mg | Freq: Once | INTRAMUSCULAR | Status: DC
  Filled 2023-09-04: qty 500

## 2023-09-04 MED ORDER — LIDOCAINE HCL (PF) 1 % IJ SOLN
5.0000 mL | Freq: Once | INTRAMUSCULAR | Status: AC
  Administered 2023-09-04: 5 mL

## 2023-09-04 MED ORDER — ACETAZOLAMIDE ER 500 MG PO CP12: 500.0000 mg | ORAL_CAPSULE | Freq: Two times a day (BID) | ORAL | Status: DC

## 2023-09-04 MED ORDER — SODIUM CHLORIDE 0.9 % IV SOLN: 250.0000 mg | Freq: Four times a day (QID) | INTRAVENOUS | Status: DC

## 2023-09-04 NOTE — ED Provider Triage Note (Signed)
 Emergency Medicine Provider Triage Evaluation Note  Cassandra Ibarra , a 33 y.o. female  was evaluated in triage.  Pt complains of papilledema.  Review of Systems  Positive: Bilateral blurry vision, headaches, papilledema for 3 days Negative: Ataxia, neurodeficit  Physical Exam  BP (!) 132/90 (BP Location: Right Wrist)   Pulse (!) 102   Temp 98.2 F (36.8 C) (Oral)   Resp 18   Ht 5' 6 (1.676 m)   Wt (!) 212.3 kg   SpO2 96%   BMI 75.54 kg/m  Gen:   Awake, no distress   Resp:  Normal effort  MSK:   Moves extremities without difficulty  Other:    Medical Decision Making  Medically screening exam initiated at 2:18 PM.  Appropriate orders placed.  Cassandra Ibarra was informed that the remainder of the evaluation will be completed by another provider, this initial triage assessment does not replace that evaluation, and the importance of remaining in the ED until their evaluation is complete.  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  Coming in today for bilateral papilledema evaluated by eye doctor.  Answering questions appropriately.  Has been dealing with intermittent headaches for several weeks.   Shermon Warren SAILOR, PA-C 09/04/23 [id number]

## 2023-09-04 NOTE — ED Notes (Signed)
 Patient transported to CT

## 2023-09-04 NOTE — ED Notes (Signed)
 IV team at bedside

## 2023-09-04 NOTE — ED Notes (Signed)
 Right eye 10/25 Left eye 10/32

## 2023-09-04 NOTE — Progress Notes (Addendum)
 Recommendations: -Close outpatient follow-up with ophthalmology for full ophthalmologic evaluation including visual field testing  -Outpatient MRI brain with and without contrast, MRI orbits with and without contrast (overall lower concern for infectious/inflammatory process given reassuring CSF studies, but do recommend MRI for completeness; cannot be accommodated on our table due to girth) -Outpatient follow-up with neurology for headache management -Would not start Diamox  at this time -Work note for 2 to 3 days to minimize risk of post LP headache -If patient is having post LP headache refractory to conservative measures (hydration, caffeine, avoidance of Valsalva discussed), should return to ED for arrangement of blood patch -If vision is rapidly worsening should seek hospitalization ideally at a place that can accommodate her for MRI (for diagnostic clarity) and potential treatment with high-dose steroids -Outpatient sleep apnea testing as this can exacerbate headaches and cause secondary transient increases in ICP   Additional history obtained from patient.  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  She does not have any personal history of kidney stones  She does not have any family history of autoimmune disease  On examination she reports  headache is slightly better when she sits up and worse when she lays down.  We had discussion on risk/benefit of trial of Diamox  despite normal opening pressure (given her BMI, my threshold to treat would be greater than 25 especially given opening pressure had to be obtained in the prone position, which can increase opening pressure by 2 to 5); patient agreed she would prefer not to start Diamox  given her last experience with CSF leak headache after lumbar puncture  On my personal visualization of her optic disks, this is slightly challenging due to photophobia but I do not feel that her disc edema is anything more than mild and could potentially be within normal variation  She additionally received Benadryl  and Reglan  at 2028  Lumbar puncture at 2125: Informed consent was obtained from the patient prior to the procedure, including potential complications of headache, allergy, and pain. With the patient prone, the lower back was prepped with Betadine. 1% Lidocaine  was used for local anesthesia. Lumbar puncture was performed at the L3-4 level using a 20 gauge needle with return of clear CSF with an opening pressure of 21.7 cm water. As on the prior lumbar puncture, the opening pressure was obtained with the patient in the prone position. Due to patient's size and risk of losing needle access, decubitus measurement of opening pressure was not performed. 30 ml of CSF were obtained for laboratory studies. After removal of approximately 16 cc of CSF patient reported improvement in headaches. The patient tolerated the procedure well and there were no apparent complications. 1. Technically successful lumbar puncture performed at the L3-4 level with return of clear CSF. 2. The opening pressure was equal to 21.7 cc of water (obtained  in the prone position). 30 cc of clear CSF were removed for laboratory studies.  CSF studies reviewed  Latest Reference Range & Units 09/04/23 19:24 09/04/23 19:29   Appearance, CSF CLEAR  CLEAR   CLEAR ! CLEAR !  Glucose, CSF 40 - 70 mg/dL 51   RBC Count, CSF 0 /cu mm 0 /cu mm  17 (H) 147 (H)  WBC, CSF 0 - 5 /cu mm 0 - 5 /cu mm  0 1  Other Cells, CSF   RARE LYMPHOCYTES FEW NEUTROPHILS,FEW LYMPHOCYTES  Color, CSF COLORLESS  COLORLESS   COLORLESS COLORLESS  Supernatant   NOT INDICATED NOT INDICATED  Total  Protein, CSF 15 - 45 mg/dL 15 - 45 mg/dL 17 17   Tube #   4 1  !: Data is abnormal (H): Data is abnormally high Meningitis/encephalitis panel negative   CT venogram reviewed personally reviewed, agree with radiology:   FINDINGS: No evidence of dural venous sinus thrombosis. Specifically, the superior sagittal, transverse, sigmoid, and straight sinuses are patent. Visualized deep cerebral veins are patent. Symmetric opacification of the cavernous sinuses. Narrowing of the distal transverse sinuses bilaterally.  IMPRESSION: 1. No evidence of dural venous sinus thrombosis. 2. Narrowing of the distal transverse sinuses bilaterally. [Scan quality somewhat limited but adequate per radiologist]  Unfortunately, MRI scanner cannot accommodate patient in our facility due to girth greater than 70 cm (73 cm at the shoulders)  Discussed with Dr. Jakie via phone and secure chat and in person  Lola Jernigan MD-PhD [facility] [phone] Available 7 PM to 7 AM, outside of these hours please call Neurologist on call as listed on Amion.

## 2023-09-04 NOTE — ED Provider Notes (Signed)
 [place] EMERGENCY DEPARTMENT AT [facility] Provider Note   CSN: [id number] Arrival date & time: 09/04/23  1229     History  No chief complaint on file.   Cassandra Ibarra is a 33 y.o. female.  33 year old female with a history of PCOS and obesity who presents emergency department with vision changes and headache.  Over the past few weeks has had a headache.  It is frontal in nature and occasionally radiate out up the top of her head.  Describes it as a clustering sensation.  Was gradual in onset.  Currently 7/10 in severity.  Says that she has had some vision changes with it as well.  Since New Year's Eve the headache got worse and she was having difficulty reading items that were up close to her face which is atypical for her.  No flashes or floaters.  Has had some ringing in her years.  Not on any hormones or birth control pills.  Recently did Zepbound  weekly for weight loss and had her Adderall increased to 20 mg and citalopram  increased to 40 mg.  Not on blood thinners.  Saw an ophthalmologist yesterday who noted that she had papilledema (which she has had before in 2018) who told her to come immediately to the emergency department.  Based on her lumbar puncture in 2018 she was supine and had a normal opening pressure.       Home Medications Prior to Admission medications   Medication Sig Start Date End Date Taking? Authorizing Provider  metoCLOPramide  (REGLAN ) 10 MG tablet Take 1 tablet (10 mg total) by mouth every 6 (six) hours. 09/04/23  Yes Yolande Lamar BROCKS, MD  albuterol  (VENTOLIN  HFA) 108 [phone] Base) MCG/ACT inhaler Inhale 2 puffs into the lungs every 6 (six) hours as needed for wheezing or shortness of breath. 07/07/22   Blaise Aleene KIDD, MD  amoxicillin -clavulanate (AUGMENTIN ) 875-125 MG tablet Take 1 tablet by mouth 2 (two) times daily. 08/02/23   Lavell Bari LABOR, FNP  ciprofloxacin -dexamethasone  (CIPRODEX ) OTIC suspension Place 4 drops into the left ear 2 (two)  times daily. 06/18/23   Moishe Chiquita HERO, NP  citalopram  (CELEXA ) 10 MG tablet Take 1 tablet (10 mg total) by mouth daily. 06/20/19   Vicci Barnie NOVAK, MD  ibuprofen  (ADVIL ) 800 MG tablet Take 1 tablet (800 mg total) by mouth every 8 (eight) hours as needed for moderate pain. 07/07/22   LampteyAleene KIDD, MD  levalbuterol  (XOPENEX  HFA) 45 MCG/ACT inhaler Inhale 1-2 puffs into the lungs every 6 (six) hours as needed for wheezing. 05/12/22   Vivienne Delon HERO, PA-C  ondansetron  (ZOFRAN ) 4 MG tablet Take 1 tablet (4 mg total) by mouth every 8 (eight) hours as needed for nausea or vomiting. 06/25/22   Richad Jon HERO, NP  tirzepatide  (ZEPBOUND ) 15 MG/0.5ML Pen Inject 15mg  under the skin once weekly. 03/23/23     tirzepatide  (ZEPBOUND ) 15 MG/0.5ML Pen Inject 15 mg into the skin once a week. 08/22/23         Allergies    Ocella [drospirenone-ethinyl estradiol ], Adhesive [tape], Lo loestrin fe [norethin  ace-eth estrad-fe], Lorazepam , and Monistat [tioconazole]    Review of Systems   Review of Systems  Physical Exam Updated Vital Signs BP 131/79   Pulse 85   Temp 98.1 F (36.7 C) (Oral)   Resp 17   Ht 5' 6 (1.676 m)   Wt (!) 212.3 kg   SpO2 100%   BMI 75.54 kg/m  Physical Exam Vitals and  nursing note reviewed.  Constitutional:      General: She is not in acute distress.    Appearance: She is well-developed.  HENT:     Head: Normocephalic and atraumatic.     Right Ear: External ear normal. There is impacted cerumen.     Left Ear: Tympanic membrane, ear canal and external ear normal.     Nose: Nose normal.  Eyes:     Extraocular Movements: Extraocular movements intact.     Conjunctiva/sclera: Conjunctivae normal.     Pupils: Pupils are equal, round, and reactive to light.     Comments: Right eye 10/25 Left eye 10/32  Cardiovascular:     Rate and Rhythm: Normal rate and regular rhythm.  Pulmonary:     Effort: Pulmonary effort is normal. No respiratory distress.  Musculoskeletal:      Cervical back: Normal range of motion and neck supple.     Right lower leg: No edema.     Left lower leg: No edema.  Skin:    General: Skin is warm and dry.  Neurological:     Mental Status: She is alert.     Comments: MENTAL STATUS: AAOx3 CRANIAL NERVES: II: Pupils equal and reactive 5 mm BL, no RAPD, no VF deficits III, IV, VI: EOM intact, no gaze preference or deviation, no nystagmus. V: normal sensation to light touch in V1, V2, and V3 segments bilaterally VII: no facial weakness or asymmetry, no nasolabial fold flattening VIII: normal hearing to speech and finger friction IX, X: normal palatal elevation, no uvular deviation XI: 5/5 head turn and 5/5 shoulder shrug bilaterally XII: midline tongue protrusion MOTOR: 5/5 strength in R shoulder flexion, elbow flexion and extension, and grip strength. 5/5 strength in L shoulder flexion, elbow flexion and extension, and grip strength.  5/5 strength in R hip and knee flexion, knee extension, ankle plantar and dorsiflexion. 5/5 strength in L hip and knee flexion, knee extension, ankle plantar and dorsiflexion. SENSORY: Normal sensation to light touch in all extremities   Psychiatric:        Mood and Affect: Mood normal.     ED Results / Procedures / Treatments   Labs (all labs ordered are listed, but only abnormal results are displayed) Labs Reviewed  CSF CELL COUNT WITH DIFFERENTIAL - Abnormal; Notable for the following components:      Result Value   Appearance, CSF CLEAR (*)    RBC Count, CSF 17 (*)    All other components within normal limits  CSF CELL COUNT WITH DIFFERENTIAL - Abnormal; Notable for the following components:   Appearance, CSF CLEAR (*)    RBC Count, CSF 147 (*)    All other components within normal limits  CSF CULTURE W GRAM STAIN  BASIC METABOLIC PANEL  CBC  PROTEIN, CSF  PROTEIN AND GLUCOSE, CSF  MENINGITIS/ENCEPHALITIS PANEL (CSF)  VDRL, CSF    EKG EKG Interpretation Date/Time:  Friday  September 04 2023 14:18:47 EST Ventricular Rate:  115 PR Interval:  148 QRS Duration:  72 QT Interval:  324 QTC Calculation: 448 R Axis:   23  Text Interpretation: Sinus tachycardia Low voltage QRS Nonspecific T wave abnormality Abnormal ECG Confirmed by Yolande Charleston [phone]) on 09/04/2023 5:24:15 PM  Radiology CT VENOGRAM HEAD Result Date: 09/04/2023 CLINICAL DATA:  Rule out Sinus Venous Thrombosis. Headache and papilledema. EXAM: CT VENOGRAM HEAD TECHNIQUE: Venographic phase images of the brain were obtained following the administration of intravenous contrast. Multiplanar reformats and maximum intensity projections were  generated. RADIATION DOSE REDUCTION: This exam was performed according to the departmental dose-optimization program which includes automated exposure control, adjustment of the mA and/or kV according to patient size and/or use of iterative reconstruction technique. CONTRAST:  75mL OMNIPAQUE  IOHEXOL  350 MG/ML SOLN COMPARISON:  Same day CT head FINDINGS: No evidence of dural venous sinus thrombosis. Specifically, the superior sagittal, transverse, sigmoid, and straight sinuses are patent. Visualized deep cerebral veins are patent. Symmetric opacification of the cavernous sinuses. Narrowing of the distal transverse sinuses bilaterally. IMPRESSION: 1. No evidence of dural venous sinus thrombosis. 2. Narrowing of the distal transverse sinuses bilaterally. Electronically Signed   By: Gilmore GORMAN Molt M.D.   On: 09/04/2023 22:18   DG Lumbar Puncture Fluoro Guide Result Date: 09/04/2023 CLINICAL DATA:  Papilledema.  Concern for pseudotumor. EXAM: DIAGNOSTIC LUMBAR PUNCTURE UNDER FLUOROSCOPIC GUIDANCE COMPARISON:  CT AP 09/04/2023. FLUOROSCOPY: Radiation Exposure Index (as provided by the fluoroscopic device): 109.1 mGy Kerma PROCEDURE: Informed consent was obtained from the patient prior to the procedure, including potential complications of headache, allergy, and pain. With the patient  prone, the lower back was prepped with Betadine. 1% Lidocaine  was used for local anesthesia. Lumbar puncture was performed at the L3-4 level using a 20 gauge needle with return of clear CSF with an opening pressure of 21.7 cm water. As on the prior lumbar puncture, the opening pressure was obtained with the patient in the prone position. Due to patient's size and risk of losing needle access, decubitus measurement of opening pressure was not performed. 30 ml of CSF were obtained for laboratory studies. After removal of approximately 16 cc of CSF patient reported improvement in headaches. The patient tolerated the procedure well and there were no apparent complications. IMPRESSION: 1. Technically successful lumbar puncture performed at the L3-4 level with return of clear CSF. 2. The opening pressure was equal to 21.7 cc of water (obtained in the prone position). 30 cc of clear CSF were removed for laboratory studies. Electronically Signed   By: Waddell Calk M.D.   On: 09/04/2023 19:41   CT Head Wo Contrast Result Date: 09/04/2023 CLINICAL DATA:  Headaches and papilledema. EXAM: CT HEAD WITHOUT CONTRAST TECHNIQUE: Contiguous axial images were obtained from the base of the skull through the vertex without intravenous contrast. RADIATION DOSE REDUCTION: This exam was performed according to the departmental dose-optimization program which includes automated exposure control, adjustment of the mA and/or kV according to patient size and/or use of iterative reconstruction technique. COMPARISON:  None Available. FINDINGS: Brain: No evidence of acute infarction, hemorrhage, hydrocephalus, extra-axial collection or mass lesion/mass effect. Normal sella. Vascular: No hyperdense vessel or unexpected calcification. Skull: Normal. Negative for fracture or focal lesion. Sinuses/Orbits: No acute finding. Other: None. IMPRESSION: 1. Normal noncontrast head CT. Electronically Signed   By: Elsie ONEIDA Shoulder M.D.   On: 09/04/2023  16:27    Procedures Procedures    Medications Ordered in ED Medications  metoCLOPramide  (REGLAN ) injection 10 mg (10 mg Intravenous Given 09/04/23 2028)  diphenhydrAMINE  (BENADRYL ) injection 25 mg (25 mg Intravenous Given 09/04/23 2028)  lidocaine  (PF) (XYLOCAINE ) 1 % injection 5 mL (5 mLs Other Given 09/04/23 1916)  iohexol  (OMNIPAQUE ) 350 MG/ML injection 75 mL (75 mLs Intravenous Contrast Given 09/04/23 2202)    ED Course/ Medical Decision Making/ A&P Clinical Course as of 09/05/23 1131  Fri Sep 04, 2023  1722 Dr Vanessa consulted from neurology. Recommends steroids and diamox .  [RP]  1726 Dr Gershon from IR consulted recommends reaching out to Fluoro.  [  RP]  1755 Dr Ceasar from fluoro to perform lumbar puncture soon. [RP]  1905 Patient taken to IR [RP]  2121 Dr Jerrie from neurology consulted.  Feels that IIH is less likely given the patient's workup. [RP]    Clinical Course User Index [RP] Yolande Lamar BROCKS, MD                                 Medical Decision Making Amount and/or Complexity of Data Reviewed Labs: ordered. Radiology: ordered.  Risk Prescription drug management.   Jakeria L Ibarra is a 33 y.o. female with comorbidities that complicate the patient evaluation including papilledema and elevated BMI who presents to the emergency department with concerns for IIH  Initial Ddx:  IIH, venous sinus thrombosis, brain mass/increased ICP  MDM/Course:  Patient presents to the emergency department with headache and some vision changes recently.  Had dilated exam with a optometrist which showed papilledema.  Has reportedly had this previously with a normal LP opening pressure.  Does appear to have some decreased visual acuity but no other neurologic deficits.  CT head without acute abnormality.  Given the patient's habitus unable to perform landmark based lumbar punctures that she had an IR guided procedure in the prone position which showed that she had an opening  pressure of 21.7 cm.  Had a CT venogram of the head that did not reveal acute abnormality.  Neurology evaluated the patient and felt this was less likely consistent with IIH and that she follow-up with an ophthalmologist, neurology, and her primary doctor to be further evaluated for the cause of her papilledema and vision changes as well as her headaches.  Also recommending a sleep study for her and outpatient MRI of her brain.  Given migraine cocktail and is feeling better after it.    This patient presents to the ED for concern of complaints listed in HPI, this involves an extensive number of treatment options, and is a complaint that carries with it a high risk of complications and morbidity. Disposition including potential need for admission considered.   Dispo: DC Home. Return precautions discussed including, but not limited to, those listed in the AVS. Allowed pt time to ask questions which were answered fully prior to dc.  Records reviewed Outpatient Clinic Notes I independently reviewed the following imaging with scope of interpretation limited to determining acute life threatening conditions related to emergency care: CT Head and agree with the radiologist interpretation with the following exceptions: none I have reviewed the patients home medications and made adjustments as needed Consults: Neurology  Portions of this note were generated with Dragon dictation software. Dictation errors may occur despite best attempts at proofreading.     Final Clinical Impression(s) / ED Diagnoses Final diagnoses:  Acute nonintractable headache, unspecified headache type  Papilledema of both eyes    Rx / DC Orders ED Discharge Orders          Ordered    Ambulatory referral to Neurology  Status:  Canceled       Comments: An appointment is requested in approximately: 2 weeks - 4 weeks (hospital follow-up for headache)   09/04/23 2320    metoCLOPramide  (REGLAN ) 10 MG tablet  Every 6 hours         09/04/23 2337    Ambulatory referral to Neurology       Comments: An appointment is requested in approximately: 2 weeks   09/04/23 2339  Yolande Lamar BROCKS, MD 09/05/23 1131

## 2023-09-04 NOTE — ED Triage Notes (Signed)
 Pt had eye appointment yesterday; sent to ed for further evaluation of bilateral papilledema; hx of same, spinal tap done; pt c/o HA, increased tinnitus, blurred vision it both eyes

## 2023-09-04 NOTE — Discharge Instructions (Addendum)
 You were seen for your headache in the emergency department.  You were given medicines which improved your symptoms.  At home, please take Tylenol  and ibuprofen  for your headache.  You may also take the Reglan  we have prescribed you for your headache or any nausea or vomiting that you have.  You may take this with Benadryl  for severe headaches but please note that this will make you drowsy.    If you develop a severe headache it may be a spinal headache. Lay down and drink coffee or a caffeine containing beverage.  Check your MyChart online for the results of any tests that had not resulted by the time you left the emergency department.   Follow-up with your primary doctor in 2-3 days regarding your visit.  Follow-up with neurology about your headache.  Follow-up with ophthalmology for dilated eye exam.  Return immediately to the emergency department if you experience any of the following: Vision changes, numbness or weakness of your arms or legs, or any other concerning symptoms.    Thank you for visiting our Emergency Department. It was a pleasure taking care of you today.

## 2023-09-04 NOTE — ED Notes (Signed)
 Verbal consent given for MSE

## 2023-09-04 NOTE — Consult Note (Signed)
 NEUROLOGY CONSULT NOTE   Date of service: September 04, 2023 Patient Name: Cassandra Ibarra MRN:  [id number] DOB:  1991/04/21 Chief Complaint: BL papiledema Requesting Provider: Yolande Lamar BROCKS, MD  History of Present Illness  Cassandra Ibarra is a 33 y.o. female with hx of GERD, OCD, PCOS, morbid obesity who has had a few weeks of pressure-like headaches every other day with associated ringing and pressure around her eyes.  In addition, she does report that over the last 6 months she has noted a decrease in her visual acuity that she initially put on her short sidedness and thoughts that she needed new pair of glasses.  Over the last 2 days, she has noted that she has trouble reading fine print and this did not make sense to her as she is nearsighted so she went into see an ophthalmologist.  During eye exam yesterday, she was found to have bilateral papilledema along with decreased visual acuity and she was requested to come to the hospital right away.  She reports a pressure-like headache primarily around her eyes and in the forehead but also extending as a band and going to the back of her head 2.  She describes associated ringing sensation in her years and sensitivity light.  She feels that cough and sneezing makes her headache worse and symptoms bearing down.  She has trouble sleeping and so was equivocal whether lying flat makes her headache worse or not.  Chart review shows that back in 2018, she had very similar headaches and she had workup with MRI of the brain without contrast which was normal.  She also had diagnostic fluoroscopic lumbar puncture but opening pressure was noted has not elevated.  The opening pressure seems to have been obtained while patient was prone with very limited return of CSF. She seemed to have been lost to follow up until yesterday when she saw ophthalmology.   ROS  Comprehensive ROS performed and pertinent positives documented in HPI   Past History   Past  Medical History:  Diagnosis Date   ADHD    Depression    Extremity cyanosis 08/2012   both hands   Fibromyalgia    Gastric ulcer    GERD (gastroesophageal reflux disease)    OCD (obsessive compulsive disorder)    Papilledema    PCOS (polycystic ovarian syndrome)    PONV (postoperative nausea and vomiting)     Past Surgical History:  Procedure Laterality Date   ESOPHAGOGASTRODUODENOSCOPY (EGD) WITH PROPOFOL  N/A 02/09/2014   Procedure: ESOPHAGOGASTRODUODENOSCOPY (EGD) WITH PROPOFOL ;  Surgeon: Belvie JONETTA Just, MD;  Location[facility] ENDOSCOPY;  Service: Endoscopy;  Laterality: N/A;   WISDOM TOOTH EXTRACTION Left 09/2013    Family History: Family History  Problem Relation Age of Onset   Arthritis Mother    Heart disease Father    Hyperlipidemia Father    Crohn's disease Neg Hx     Social History  reports that she has never smoked. She has never used smokeless tobacco. She reports current alcohol use. She reports that she does not use drugs.  Allergies  Allergen Reactions   Ocella [Drospirenone-Ethinyl Estradiol ] Shortness Of Breath   Adhesive [Tape] Itching   Lo Loestrin Fe [Norethin  Ace-Eth Estrad-Fe] Palpitations   Lorazepam  Other (See Comments)    makes me angry   Monistat [Tioconazole] Itching    Medications   Current Facility-Administered Medications:    acetaZOLAMIDE  (DIAMOX ) injection 500 mg, 500 mg, Intravenous, Once **FOLLOWED BY** [START ON 09/05/2023] acetaZOLAMIDE  ER (DIAMOX ) 12  hr capsule 500 mg, 500 mg, Oral, Q12H, Vergia Chea, MD   diphenhydrAMINE  (BENADRYL ) injection 25 mg, 25 mg, Intravenous, Once, Yolande Lamar BROCKS, MD   folic acid  (FOLVITE ) tablet 1 mg, 1 mg, Oral, Daily, Nicholas Ossa, MD   metoCLOPramide  (REGLAN ) injection 10 mg, 10 mg, Intravenous, Once, Yolande Lamar BROCKS, MD  Current Outpatient Medications:    albuterol  (VENTOLIN  HFA) 108 (90 Base) MCG/ACT inhaler, Inhale 2 puffs into the lungs every 6 (six) hours as needed for wheezing or  shortness of breath., Disp: 6.7 g, Rfl: 0   amoxicillin -clavulanate (AUGMENTIN ) 875-125 MG tablet, Take 1 tablet by mouth 2 (two) times daily., Disp: 14 tablet, Rfl: 0   ciprofloxacin -dexamethasone  (CIPRODEX ) OTIC suspension, Place 4 drops into the left ear 2 (two) times daily., Disp: 7.5 mL, Rfl: 0   citalopram  (CELEXA ) 10 MG tablet, Take 1 tablet (10 mg total) by mouth daily., Disp: 30 tablet, Rfl: 2   ibuprofen  (ADVIL ) 800 MG tablet, Take 1 tablet (800 mg total) by mouth every 8 (eight) hours as needed for moderate pain., Disp: 21 tablet, Rfl: 0   levalbuterol  (XOPENEX  HFA) 45 MCG/ACT inhaler, Inhale 1-2 puffs into the lungs every 6 (six) hours as needed for wheezing., Disp: 45 g, Rfl: 0   ondansetron  (ZOFRAN ) 4 MG tablet, Take 1 tablet (4 mg total) by mouth every 8 (eight) hours as needed for nausea or vomiting., Disp: 20 tablet, Rfl: 0   tirzepatide  (ZEPBOUND ) 15 MG/0.5ML Pen, Inject 15mg  under the skin once weekly., Disp: 2 mL, Rfl: 0   tirzepatide  (ZEPBOUND ) 15 MG/0.5ML Pen, Inject 15 mg into the skin once a week., Disp: 2 mL, Rfl: 0  Vitals   Vitals:   09/04/23 1233 09/04/23 1327 09/04/23 1402  BP: 125/89  (!) 132/90  Pulse: (!) 132  (!) 102  Resp: 19  18  Temp: 98 F (36.7 C)  98.2 F (36.8 C)  TempSrc: Oral  Oral  SpO2: 97%  96%  Weight:  (!) 212.3 kg   Height:  5' 6 (1.676 m)     Body mass index is 75.54 kg/m.  Physical Exam   General: Laying comfortably in bed; in no acute distress.  HENT: Normal oropharynx and mucosa. Normal external appearance of ears and nose.  Neck: Supple, no pain or tenderness  CV: No JVD. No peripheral edema.  Pulmonary: Symmetric Chest rise. Normal respiratory effort.  Abdomen: Soft to touch, non-tender.  Ext: No cyanosis, edema, or deformity  Skin: No rash. Normal palpation of skin.   Musculoskeletal: Normal digits and nails by inspection. No clubbing.   Neurologic Examination  Mental status/Cognition: Alert, oriented to self, place,  month and year, good attention.  Speech/language: Fluent, comprehension intact, object naming intact, repetition intact.  Cranial nerves:   CN II Pupils equal and reactive to light, no VF deficits. Visual acuity testing with R eye 10/25 and L eye 10/32.   CN III,IV,VI EOM intact, no gaze preference or deviation, no nystagmus    CN V normal sensation in V1, V2, and V3 segments bilaterally    CN VII no asymmetry, no nasolabial fold flattening    CN VIII normal hearing to speech    CN IX & X normal palatal elevation, no uvular deviation    CN XI 5/5 head turn and 5/5 shoulder shrug bilaterally    CN XII midline tongue protrusion    Motor:  Muscle bulk: normal, tone normal, pronator drift none tremor none Mvmt Root Nerve  Muscle Right Left  Comments  SA C5/6 Ax Deltoid 5 5   EF C5/6 Mc Biceps 5 5   EE C6/7/8 Rad Triceps 5 5   WF C6/7 Med FCR     WE C7/8 PIN ECU     F Ab C8/T1 U ADM/FDI 5 5   HF L1/2/3 Fem Illopsoas 5 5   KE L2/3/4 Fem Quad 5 5   DF L4/5 D Peron Tib Ant 5 5   PF S1/2 Tibial Grc/Sol 5 5    Sensation:  Light touch Intact throughout   Pin prick    Temperature    Vibration   Proprioception    Coordination/Complex Motor:  - Finger to Nose intact bilaterally - Heel to shin intact bilaterally - Rapid alternating movement are normal - Gait: deferred.  Labs/Imaging/Neurodiagnostic studies   CBC: No results for input(s): WBC, NEUTROABS, HGB, HCT, MCV, PLT in the last 168 hours. Basic Metabolic Panel:  Lab Results  Component Value Date   NA 140 02/08/2019   K 3.9 02/08/2019   CO2 24 02/08/2019   GLUCOSE 88 02/08/2019   BUN 9 02/08/2019   CREATININE 0.60 02/08/2019   CALCIUM  9.3 02/08/2019   GFRNONAA >60 02/08/2019   GFRAA >60 02/08/2019   Lipid Panel:  Lab Results  Component Value Date   LDLCALC 96 05/18/2015   HgbA1c:  Lab Results  Component Value Date   HGBA1C 5.6 05/18/2015   Urine Drug Screen: No results found for: LABOPIA,  COCAINSCRNUR, LABBENZ, AMPHETMU, THCU, LABBARB  Alcohol Level No results found for: ETH INR No results found for: INR APTT No results found for: APTT AED levels: No results found for: PHENYTOIN, ZONISAMIDE, LAMOTRIGINE, LEVETIRACETA  CT Head without contrast(Personally reviewed): CTH was negative for a large hypodensity concerning for a large territory infarct or hyperdensity concerning for an ICH  CT Venogram head: Pending  MRI Brain: pending  ASSESSMENT   Ajna L Reasoner is a 33 y.o. female with hx of GERD, OCD, PCOS, morbid obesity who has had a few weeks of pressure-like headaches every other day with associated ringing and pressure around her eyes.  In addition, she does report that over the last 6 months she has noted a decrease in her visual acuity that she initially put on her short sidedness and thoughts that she needed new pair of glasses.  Over the last 2 days, she has noted that she has trouble reading fine print and this did not make sense to her as she is nearsighted so she went into see an ophthalmologist.  During eye exam yesterday, she was found to have bilateral papilledema along with decreased visual acuity and sent to the ED.  Noted papiledema and decreased visual acuity is concerning for elevated ICP. 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 Given threatened vision, will give her IV Acetazolamide  500 once and start on PO Acetazolamide .  RECOMMENDATIONS  - Acetazolamide  500mg  IV once, followed by 500mg  BID PO - CT Venogram - Fluoro guided Lpm, high volume tap with opening pressure. Unable to get bedside LP due to elevated BMI - Folic acid  PO 1mg  daily. - Headache cocktail. - further recs pending above.  ______________________________________________________________________    Signed, Ruari Duggan, MD [facility]

## 2023-09-06 LAB — CSF CELL COUNT WITH DIFFERENTIAL
RBC Count, CSF: 147 /mm3 — ABNORMAL HIGH
RBC Count, CSF: 17 /mm3 — ABNORMAL HIGH
Tube #: 1
Tube #: 4
WBC, CSF: 0 /mm3 (ref 0–5)
WBC, CSF: 1 /mm3 (ref 0–5)

## 2023-09-07 LAB — VDRL, CSF: VDRL Quant, CSF: NONREACTIVE

## 2023-09-08 LAB — CSF CULTURE W GRAM STAIN: Gram Stain: NONE SEEN

## 2023-09-10 ENCOUNTER — Ambulatory Visit: Payer: BC Managed Care – PPO | Admitting: Clinical

## 2023-09-16 ENCOUNTER — Ambulatory Visit: Payer: 59 | Admitting: Clinical

## 2023-09-16 DIAGNOSIS — F4323 Adjustment disorder with mixed anxiety and depressed mood: Secondary | ICD-10-CM
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
## 2023-09-30 ENCOUNTER — Ambulatory Visit: Payer: BC Managed Care – PPO | Admitting: Clinical

## 2023-09-30 ENCOUNTER — Other Ambulatory Visit (HOSPITAL_COMMUNITY): Payer: Self-pay

## 2023-09-30 DIAGNOSIS — F4323 Adjustment disorder with mixed anxiety and depressed mood: Secondary | ICD-10-CM

## 2023-09-30 MED ORDER — ZEPBOUND 15 MG/0.5ML ~~LOC~~ SOAJ
15.0000 mg | SUBCUTANEOUS | 1 refills | Status: AC
  Filled 2023-09-30: qty 2, 28d supply, fill #0

## 2023-09-30 NOTE — Progress Notes (Signed)
Time: 10:04am -10:58 am CPT Code: 82956O-13 Diagnosis: F43.23  Cassandra Ibarra was seen remotely using secure video conferencing. She was in her office at work and therapist was in her  at office time of the appointment. Client is aware of risks of telehealth and consented to a virtual visit. She reported continued improvement in mood and well being since her last session. She has continued showering more regularly and maintaining a neat home environment, as well as preparing healthy meals for herself at home. She reported a sense of feeling more like herself before her marriage and subsequent divorce. However, she also reported an increase in anxiety, along with vivid, upsetting dreams in recent weeks. Therapist encouraged her to query with her PCP whether the dreams may relate to medication changes. Therapist engaged her in discussion of her anxiety, and she connected it to changes in the political administration. Therapist offered validation and support. She is scheduled to be seen again on 2/10.    Treatment Plan Client Abilities/Strengths  [name] described herself as open to listen to insights from others.  Client Treatment Preferences:  [name] shared that she is ok with appointments during the daytime, but there is a chance she may need to change her appointments to better fit her work schedule. She prefers appointments on Mondays, Wednesdays, or Fridays.  Client Statement of Needs  [name] shared that she is in need of a professional outlet for stress.  Treatment Level  She shared that she would probably benefit from biweekly sessions.   Symptoms  Problems Addressed  [name] is going through a divorce that marks the end of a 10-year relationship, as well as the losses of several close family members. She described her childhood as "chaotic," with ongoing stress related to her father's health concerns.  Goals 1. [name] will be provided with support and, when appropriate, strategies to cope with  her current situation. Objective [name] will develop coping strategies and increase overall motivation.  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

## 2023-10-06 ENCOUNTER — Encounter (HOSPITAL_COMMUNITY): Payer: Self-pay

## 2023-10-07 ENCOUNTER — Other Ambulatory Visit (HOSPITAL_COMMUNITY): Payer: Self-pay

## 2023-10-12 ENCOUNTER — Ambulatory Visit: Payer: 59 | Admitting: Clinical

## 2023-10-12 DIAGNOSIS — F4323 Adjustment disorder with mixed anxiety and depressed mood: Secondary | ICD-10-CM

## 2023-10-12 NOTE — Progress Notes (Signed)
 Time: 10:04am -10:58 am CPT Code: 84696E-95 Diagnosis: F43.23  Cassandra Ibarra was seen remotely using secure video conferencing. She was in her office at work and therapist was in her  at office time of the appointment. Client is aware of risks of telehealth and consented to a virtual visit. Cassandra Ibarra reported that, while overall her mood remains improved, she has experienced increased feelings of fatigue. She connected this to work related stress and anxiety about world events. She reported that she has continued to maintain her self-care routine, and feels good about this. She is scheduled to be seen again in two weeks.     Treatment Plan Client Abilities/Strengths  Cassandra Ibarra described herself as open to listen to insights from others.  Client Treatment Preferences:  Cassandra Ibarra shared that she is ok with appointments during the daytime, but there is a chance she may need to change her appointments to better fit her work schedule. She prefers appointments on Mondays, Wednesdays, or Fridays.  Client Statement of Needs  Cassandra Ibarra shared that she is in need of a professional outlet for stress.  Treatment Level  She shared that she would probably benefit from biweekly sessions.   Symptoms  Problems Addressed  Cassandra Ibarra is going through a divorce that marks the end of a 10-year relationship, as well as the losses of several close family members. She described her childhood as "chaotic," with ongoing stress related to her father's health concerns.  Goals 1. Cassandra Ibarra will be provided with support and, when appropriate, strategies to cope with her current situation. Objective Cassandra Ibarra will develop coping strategies and increase overall motivation.  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

## 2023-10-15 ENCOUNTER — Telehealth: Payer: 59 | Admitting: Physician Assistant

## 2023-10-15 DIAGNOSIS — J208 Acute bronchitis due to other specified organisms: Secondary | ICD-10-CM

## 2023-10-15 MED ORDER — BENZONATATE 100 MG PO CAPS: 100.0000 mg | ORAL_CAPSULE | Freq: Three times a day (TID) | ORAL | 0 refills | Status: DC | PRN

## 2023-10-15 MED ORDER — PREDNISONE 10 MG (21) PO TBPK: ORAL_TABLET | ORAL | 0 refills | Status: DC

## 2023-10-15 NOTE — Progress Notes (Signed)
Message sent to patient requesting further input regarding current symptoms. Awaiting patient response.

## 2023-10-15 NOTE — Progress Notes (Signed)
I have spent 5 minutes in review of e-visit questionnaire, review and updating patient chart, medical decision making and response to patient.   Piedad Climes, PA-C

## 2023-10-15 NOTE — Progress Notes (Signed)
E-Visit for Cough   We are sorry that you are not feeling well.  Here is how we plan to help!  Based on your presentation I believe you most likely have A cough due to a virus.  This is called viral bronchitis and is best treated by rest, plenty of fluids and control of the cough.  You may use Ibuprofen or Tylenol as directed to help your symptoms.     In addition you may use A prescription cough medication called Tessalon Perles 100mg . You may take 1-2 capsules every 8 hours as needed for your cough. I have also prescribed a steroid pack to take as directed.  From your responses in the eVisit questionnaire you describe inflammation in the upper respiratory tract which is causing a significant cough.  This is commonly called Bronchitis and has four common causes:   Allergies Viral Infections Acid Reflux Bacterial Infection Allergies, viruses and acid reflux are treated by controlling symptoms or eliminating the cause. An example might be a cough caused by taking certain blood pressure medications. You stop the cough by changing the medication. Another example might be a cough caused by acid reflux. Controlling the reflux helps control the cough.  USE OF BRONCHODILATOR ("RESCUE") INHALERS: There is a risk from using your bronchodilator too frequently.  The risk is that over-reliance on a medication which only relaxes the muscles surrounding the breathing tubes can reduce the effectiveness of medications prescribed to reduce swelling and congestion of the tubes themselves.  Although you feel brief relief from the bronchodilator inhaler, your asthma may actually be worsening with the tubes becoming more swollen and filled with mucus.  This can delay other crucial treatments, such as oral steroid medications. If you need to use a bronchodilator inhaler daily, several times per day, you should discuss this with your provider.  There are probably better treatments that could be used to keep your asthma  under control.     HOME CARE Only take medications as instructed by your medical team. Complete the entire course of an antibiotic. Drink plenty of fluids and get plenty of rest. Avoid close contacts especially the very young and the elderly Cover your mouth if you cough or cough into your sleeve. Always remember to wash your hands A steam or ultrasonic humidifier can help congestion.   GET HELP RIGHT AWAY IF: You develop worsening fever. You become short of breath You cough up blood. Your symptoms persist after you have completed your treatment plan MAKE SURE YOU  Understand these instructions. Will watch your condition. Will get help right away if you are not doing well or get worse.    Thank you for choosing an e-visit.  Your e-visit answers were reviewed by a board certified advanced clinical practitioner to complete your personal care plan. Depending upon the condition, your plan could have included both over the counter or prescription medications.  Please review your pharmacy choice. Make sure the pharmacy is open so you can pick up prescription now. If there is a problem, you may contact your provider through [employer] and have the prescription routed to another pharmacy.  Your safety is important to [place]. If you have drug allergies check your prescription carefully.   For the next 24 hours you can use MyChart to ask questions about today's visit, request a non-urgent call back, or ask for a work or school excuse. You will get an email in the next two days asking about your experience. I hope that  your e-visit has been valuable and will speed your recovery.

## 2023-10-27 ENCOUNTER — Ambulatory Visit: Payer: BC Managed Care – PPO | Admitting: Clinical

## 2023-10-27 DIAGNOSIS — F4323 Adjustment disorder with mixed anxiety and depressed mood: Secondary | ICD-10-CM

## 2023-10-27 NOTE — Progress Notes (Addendum)
 Time: 10:04am -10:58 am CPT Code: 29562Z-30 Diagnosis: F43.23  Cassandra Ibarra was seen remotely using secure video conferencing. She was in her office at work and therapist was in her at office time of the appointment. Client is aware of risks of telehealth and consented to a virtual visit. Cassandra Ibarra reported that she continues to do well overall, and has maintained her self-care routine. She has begun to explore the idea of increasing her exercise as she notices an increase in mobility with her weightloss. Session focused on dynamics in her relationship with her mother. [name] processed several events from her past, and therapist suggested communication strategies. She is scheduled to be seen again in two weeks.     Treatment Plan Client Abilities/Strengths  [name] described herself as open to listen to insights from others.  Client Treatment Preferences:  [name] shared that she is ok with appointments during the daytime, but there is a chance she may need to change her appointments to better fit her work schedule. She prefers appointments on Mondays, Wednesdays, or Fridays.  Client Statement of Needs  [name] shared that she is in need of a professional outlet for stress.  Treatment Level  She shared that she would probably benefit from biweekly sessions.   Symptoms  Problems Addressed  [name] is going through a divorce that marks the end of a 10-year relationship, as well as the losses of several close family members. She described her childhood as "chaotic," with ongoing stress related to her father's health concerns.  Goals 1. [name] will be provided with support and, when appropriate, strategies to cope with her current situation. Objective [name] will develop coping strategies and increase overall motivation.  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

## 2023-10-29 ENCOUNTER — Other Ambulatory Visit: Payer: Self-pay

## 2023-10-29 ENCOUNTER — Emergency Department (HOSPITAL_COMMUNITY)
Admission: EM | Admit: 2023-10-29 | Discharge: 2023-10-29 | Disposition: A | Discharge status: Home / Self Care | Payer: Worker's Compensation | Attending: Emergency Medicine | Admitting: Emergency Medicine

## 2023-10-29 ENCOUNTER — Encounter (HOSPITAL_COMMUNITY): Payer: Self-pay

## 2023-10-29 ENCOUNTER — Emergency Department (HOSPITAL_COMMUNITY): Payer: 59

## 2023-10-29 ENCOUNTER — Emergency Department (HOSPITAL_COMMUNITY): Payer: Worker's Compensation

## 2023-10-29 DIAGNOSIS — S0990XA Unspecified injury of head, initial encounter: Secondary | ICD-10-CM | POA: Insufficient documentation

## 2023-10-29 DIAGNOSIS — M549 Dorsalgia, unspecified: Secondary | ICD-10-CM | POA: Insufficient documentation

## 2023-10-29 DIAGNOSIS — W07XXXA Fall from chair, initial encounter: Secondary | ICD-10-CM | POA: Diagnosis not present

## 2023-10-29 DIAGNOSIS — E669 Obesity, unspecified: Secondary | ICD-10-CM | POA: Insufficient documentation

## 2023-10-29 DIAGNOSIS — W19XXXA Unspecified fall, initial encounter: Secondary | ICD-10-CM

## 2023-10-29 DIAGNOSIS — Z6841 Body Mass Index (BMI) 40.0 and over, adult: Secondary | ICD-10-CM | POA: Diagnosis not present

## 2023-10-29 LAB — PREGNANCY, URINE: Preg Test, Ur: NEGATIVE

## 2023-10-29 MED ORDER — LIDOCAINE 5 % EX PTCH
1.0000 | MEDICATED_PATCH | CUTANEOUS | Status: DC
  Administered 2023-10-29: 1 via TRANSDERMAL
  Filled 2023-10-29: qty 1

## 2023-10-29 MED ORDER — NAPROXEN 500 MG PO TABS
500.0000 mg | ORAL_TABLET | Freq: Once | ORAL | Status: AC
  Administered 2023-10-29: 500 mg via ORAL
  Filled 2023-10-29: qty 1

## 2023-10-29 MED ORDER — CYCLOBENZAPRINE HCL 10 MG PO TABS: 10.0000 mg | ORAL_TABLET | Freq: Two times a day (BID) | ORAL | 0 refills | Status: DC | PRN

## 2023-10-29 MED ORDER — NAPROXEN 375 MG PO TABS: 375.0000 mg | ORAL_TABLET | Freq: Two times a day (BID) | ORAL | 0 refills | Status: DC

## 2023-10-29 NOTE — ED Provider Notes (Signed)
 [place] EMERGENCY DEPARTMENT AT [facility] Provider Note   CSN: [id number] Arrival date & time: 10/29/23  1606     History {Add pertinent medical, surgical, social history, OB history to HPI:1} Chief Complaint  Patient presents with  . Head Injury    Cassandra Ibarra is a 33 y.o. female with past medical history of PCOS, fibromyalgia, BMI 72 presents to emergency department for evaluation of head pain, back pain following fall today.  She reports that she was sitting Sitting in principal office and chair broke. Hit back and head on cabinet behind her Ttp of thoracic and lumbar midspinous and right lower paraspinous process No loc, visual disturbances   Head Injury      Home Medications Prior to Admission medications   Medication Sig Start Date End Date Taking? Authorizing Provider  albuterol (VENTOLIN HFA) 108 (90 Base) MCG/ACT inhaler Inhale 2 puffs into the lungs every 6 (six) hours as needed for wheezing or shortness of breath. 07/07/22   Lamptey, Britta Mccreedy, MD  benzonatate (TESSALON) 100 MG capsule Take 1 capsule (100 mg total) by mouth 3 (three) times daily as needed for cough. 10/15/23   Waldon Merl, PA-C  ibuprofen (ADVIL) 800 MG tablet Take 1 tablet (800 mg total) by mouth every 8 (eight) hours as needed for moderate pain. 07/07/22   Merrilee Jansky, MD  predniSONE (STERAPRED UNI-PAK 21 TAB) 10 MG (21) TBPK tablet Take following package directions 10/15/23   Waldon Merl, PA-C  tirzepatide [facility]) 15 MG/0.5ML Pen Inject 15 mg into the skin once a week. 09/30/23         Allergies    Ocella [drospirenone-ethinyl estradiol], Adhesive [tape], Lo loestrin fe [norethin ace-eth estrad-fe], Lorazepam, and Monistat [tioconazole]    Review of Systems   Review of Systems  Physical Exam Updated Vital Signs BP 132/89   Pulse (!) 103   Temp 97.7 F (36.5 C) (Oral)   Resp 18   Ht 5\' 7"  (1.702 m)   Wt (!) 209.1 kg   SpO2 98%   BMI 72.20 kg/m   Physical Exam  ED Results / Procedures / Treatments   Labs (all labs ordered are listed, but only abnormal results are displayed) Labs Reviewed  PREGNANCY, URINE    EKG None  Radiology CT Head Wo Contrast Result Date: 10/29/2023 CLINICAL DATA:  Head trauma, focal neuro findings (Age 38-64y). Fall from chair, hitting head on a cabinet. Headache. EXAM: CT HEAD WITHOUT CONTRAST TECHNIQUE: Contiguous axial images were obtained from the base of the skull through the vertex without intravenous contrast. RADIATION DOSE REDUCTION: This exam was performed according to the departmental dose-optimization program which includes automated exposure control, adjustment of the mA and/or kV according to patient size and/or use of iterative reconstruction technique. COMPARISON:  Head CT 09/04/2023 FINDINGS: Brain: There is no evidence of an acute infarct, intracranial hemorrhage, mass, midline shift, or extra-axial fluid collection. Cerebral volume is normal. The ventricles are normal in size. Vascular: No hyperdense vessel. Skull: No acute fracture or suspicious lesion. Sinuses/Orbits: Visualized paranasal sinuses and mastoid air cells are clear. Unremarkable orbits. Other: None. IMPRESSION: Negative head CT. Electronically Signed   By: Sebastian Ache M.D.   On: 10/29/2023 18:04    Procedures Procedures  {Document cardiac monitor, telemetry assessment procedure when appropriate:1}  Medications Ordered in ED Medications - No data to display  ED Course/ Medical Decision Making/ A&P   {   Click here for ABCD2, HEART and other  calculatorsREFRESH Note before signing :1}                              Medical Decision Making Amount and/or Complexity of Data Reviewed Radiology: ordered.  Risk Prescription drug management.   ***  {Document critical care time when appropriate:1} {Document review of labs and clinical decision tools ie heart score, Chads2Vasc2 etc:1}  {Document your independent review of  radiology images, and any outside records:1} {Document your discussion with family members, caretakers, and with consultants:1} {Document social determinants of health affecting pt's care:1} {Document your decision making why or why not admission, treatments were needed:1} Final Clinical Impression(s) / ED Diagnoses Final diagnoses:  None    Rx / DC Orders ED Discharge Orders     None

## 2023-10-29 NOTE — Discharge Instructions (Addendum)
 Thank you for letting us evaluate you today.  Your CT of your head was negative for blood.  Your x-rays of your back do not show a compression fracture.  I will provide you with "strong ibuprofen" for pain: Naproxen.  Please do not take this with ibuprofen, Aleve, Advil, aspirin as they are all in the same family.  You may take Tylenol in addition to this.  Also sent Flexeril to your pharmacy for muscle spasms.  Do not take this and operate heavy machinery including driving, nor drink alcohol as it makes you drowsy.  You may take it at night if it makes her too drowsy or cut pill in half.  You may apply over-the-counter lidocaine patches in areas of pain.  Please make sure to have limited screen time, brain rest, plenty of sleep to heal brain.

## 2023-10-29 NOTE — ED Triage Notes (Signed)
 Pt fell out of a chair and hit head on a cabinet while at work. No laceration. No LOC, no blood thinners. C/o headache and left-sided back pain

## 2023-10-29 NOTE — ED Provider Triage Note (Signed)
 Emergency Medicine Provider Triage Evaluation Note  Cassandra Ibarra , a 33 y.o. female  was evaluated in triage.  Pt complains of Fall from seat position into cabinet.  Review of Systems  Positive: HA, dizziness, L sided back pain (worse with movement) Negative: Vision changes, N/V, numbness, weakness, loss of bowel or bladder, neck pain, LOC, CP, SHOB, fever, chills  Physical Exam  BP 132/89   Pulse (!) 103   Temp 97.7 F (36.5 C) (Oral)   Resp 18   Ht 5\' 7"  (1.702 m)   Wt (!) 209.1 kg   SpO2 98%   BMI 72.20 kg/m  Gen:   Awake, no distress   Resp:  Normal effort  MSK:   Moves extremities without difficulty  Other:    Medical Decision Making  Medically screening exam initiated at 4:36 PM.  Appropriate orders placed.  Tresa Endo Ayars was informed that the remainder of the evaluation will be completed by another provider, this initial triage assessment does not replace that evaluation, and the importance of remaining in the ED until their evaluation is complete.  Labs and imaging ordered   Gretta Began 10/29/23 1610

## 2023-11-23 ENCOUNTER — Ambulatory Visit (INDEPENDENT_AMBULATORY_CARE_PROVIDER_SITE_OTHER): Payer: 59 | Admitting: Clinical

## 2023-11-23 DIAGNOSIS — F4312 Post-traumatic stress disorder, chronic: Secondary | ICD-10-CM

## 2023-11-23 DIAGNOSIS — F331 Major depressive disorder, recurrent, moderate: Secondary | ICD-10-CM

## 2023-11-23 NOTE — Progress Notes (Signed)
 Time: 10:04am -10:58 am CPT Code: [id number] Diagnosis: F33.2, F43.12  Cassandra Ibarra was seen remotely using secure video conferencing. She was in her office at work and therapist was in her at office time of the appointment. Client is aware of risks of telehealth and consented to a virtual visit. Cassandra Ibarra arrived late to the appointment due to a work emergency, but therapist was present at scheduled session time. Session began by reviewing and updating [name]'s treatment plan. She provided input into and verbal consent to all goals and interventions. She continues to maintain self-care habits discussed in session, and reported continued improvements in mood. She is scheduled to be seen again in two weeks.     Treatment Plan Client Abilities/Strengths  Danica described herself as open to listen to insights from others.  Client Treatment Preferences:  Merilyn shared that she is ok with appointments during the daytime, but there is a chance she may need to change her appointments to better fit her work schedule. She prefers appointments on Mondays, Wednesdays, or Fridays.  Client Statement of Needs  Jurney shared that she is in need of a professional outlet for stress.  Treatment Level  She shared that she would probably benefit from biweekly sessions.   Symptoms  Problems Addressed  Liisa is going through a divorce that marks the end of a 10-year relationship, as well as the losses of several close family members. She described her childhood as "chaotic," with ongoing stress related to her father's health concerns.  Goals 1. Damani will be provided with support and, when appropriate, strategies to cope with her current situation. Objective Zabella will develop coping strategies and increase overall motivation.  Target Date: 11/22/2024 Frequency: Biweekly  Progress: 75 Modality: Individual   Related Interventions Yariana will be provided with opportunities to process her experiences in session Therapist  will help Weston to notice and disengage from maladaptive thoughts and behaviors using CBT-based strategies Khalise will have opportunities to process experiences from her childhood in order to better understand their current impact Therapist will provide referrals for additional resources as appropriate  Diagnosis Axis none Major Depression, Recurrent, Moderate (F33.2) PTSD, Chronic    Axis none    Conditions For Discharge Achievement of treatment goals and objectives   Intake  Presenting Problem   Sheneka presented seeking individual therapy. She shared that she has had one of the hardest years of her life. She suffered COVID in September, followed by pneumonia, followed by bronchitis. She and her wife separated but were still living together at time of intake. Since January 21, 2026her  grandmother died, then her father passed away three weeks later. She had not been in contact with either for mental health purposes for 10 years. Her ex-wife suffered a motorcycle accident two weeks before the initial session and Malu was caring for her during her recovery. Suicidal ideation and intent were denied. Symptoms  Fatigue/lack of energy, sleep disturbances including vivid dreams, she shared a general sense of having a low battery/emotional exhaustion, difficulty connecting with emotions and a sense of "pushing through" History of Problem   Emmalie suffers from polycystic ovarian syndrome. End of 2017/2018 she was taking different hormone based birth controls. 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 These lasted roughly two weeks. She reported that she "woke up and cried the whole day" for two weeks. She attended therapy during this time for about two months. She also began  taking antidepressant medication. She also attended therapy in 2012 following a bad breakup. She first began taking  antidepressants in 2018, but reported a history of "taking and stopping them." She has taken Lexapro, buspar, trazodone, and has spent the most time taking citalopram. She reported some fear of taking new medication due to her experiences with an uncle who took Wellbutrin and had a terrible suicidal ideation episode that resulted in him going missing for three days. She shared that she has similar medication reactions to her mother. She was not taking any prescription medications at time of intake. She is prescribed zetbound and Vyvanse but has not been able to access Vyvanse due to the shortage and has not been actively taking either. Chany revealed that she had caught her father stalking her home 6 months prior to his death. Recent Trigger     Marital and Family Information  She and her wife had been married for over 8 years, and together for 10 years prior to divorcing. She shared that she had a high school relationship that ended prior to college, several relationships in college, and met her wife and married her by 2.    Present family concerns/problems: Darnisha is very close with her mother. She has a sister who is 72 years older with whom she has a good relationship. Her father died in 10-16-2023. She has extended family  she sees on holidays. She has not communicated with anyone on her father's side for ten years except for one uncle. She described her father as an addict since before she was born. He underwent a quadruple bypass when Kyndra was 10, and became addicted to prescription painkillers following this. She described him as manipulative and narcisstic toward people he was close with. She reported frequent drama in family relationships having to do with her father's addictions, and aspects of her childhood as "chaotic." She shared that she had been "daddy's little girl" prior to maturing and becoming more aware of his challenges. She shared that she had only spoken with her father twice in  the 10 years leading to his death. She also reported writing him a long letter that he did not respond to, which was a hurtful experience for her. He reportedly died as a result of a heart attack three weeks after the death of Loyal's grandmother, who died following a fall.   Strengths/resources in the family/friends:  She shared that she participates in an active friend community online. She has met and planned trips with several people from the group. She is still close with two of her best friends from childhood. She also maintains a close relationship with her older sister and several work friends. However, she described herself as less social than pre-COVID. Since her divorce, Cassandra Ibarra has been involved in an online romantic relationship since late 2024.    Family background / ethnic factors: Kadesia shared that her wife was a cisgendered female for the beginning of their relationship, but transitioned to female in 2019. She described this as very difficult for her, and that there were "no warning signs." She shared that prior to the transition, her wife had been very masculine, and described the experience as being "like my husband died." Looking back, she wonders if they should have ended the relationship at that time. Julaine shard that her wife has a history of hiding the truth not to hurt people, but eventually revealed that she was no longer attracted to women. She reported that this revelation had prompted  her to grieve her husband and earlier stages of the relationship, but not her wife.    No needs/concerns related to ethnicity reported when asked: No  Education/Vocation   Interpersonal concerns/problems: None. 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 Personal strengths: Brittiany shared that she is continuing to function despite challenges, she actively looks for the positive. In the year since starting therapy, she has made significant  strides toward establishing healthy habits, including showering regularly, initiating a skincare routine, meal-prepping, cleaning her home, and losing 60 lbs. Military/work problems/concerns: None  Leisure Activities/Daily Functioning : no changes noted. Enjoys online social activities, including talking and gaming Legal Status  No Legal Problems: No Medical/Nutritional Concerns   Comments: polycystic ovarian syndrome, ADHD, fibromyalgia, questions whether she may be on the spectrum   Substance use/abuse/dependence: Malayla described herself as a social drinker, who drinks occasionally (every other week). She has previously "dabbled" with edibles, but found it difficult to find a good dose.    Comments:    Religion/Spirituality: Chole described herself as leaning toward agnosticism. She suspects she may have religious trauma from childhood.    General Behavior: WNL Attire: WNL Gait: not observed-telehealth Motor Activity: WNL Stream of Thought - Productivity: WNL Stream of thought - Progression: WNL Stream of thought - Language:  WNL Emotional tone and reactions - Mood: WNL  Emotional tone and reactions - Affect: WNL Mental trend/Content of thoughts - Perception: WNL Mental trend/Content of thoughts - Orientation: WNL Mental trend/Content of thoughts - Memory: WNL Mental trend/Content of thoughts - General knowledge: WNL Insight: WNL Judgment: WNL Intelligence: WNL Mental Status Comment: WNL Diagnostic Summary Major Depression, Recurrent, Moderate (F33.2), Post Traumatic Stress disorder, chronic (F43.12)               [name], PhD

## 2023-12-09 ENCOUNTER — Ambulatory Visit: Payer: 59 | Admitting: Clinical

## 2023-12-09 DIAGNOSIS — F4312 Post-traumatic stress disorder, chronic: Secondary | ICD-10-CM | POA: Diagnosis not present

## 2023-12-09 DIAGNOSIS — F331 Major depressive disorder, recurrent, moderate: Secondary | ICD-10-CM | POA: Diagnosis not present

## 2023-12-09 DIAGNOSIS — F332 Major depressive disorder, recurrent severe without psychotic features: Secondary | ICD-10-CM

## 2023-12-09 NOTE — Progress Notes (Signed)
 Time: 9:02am -9:56 am CPT Code: [id number] Diagnosis: F33.2, F43.12  Cassandra Ibarra was seen remotely using secure video conferencing. She was in her office at work and therapist was in her at office time of the appointment. Client is aware of risks of telehealth and consented to a virtual visit. Session focused on stressors that had arisen in her relationship. Therapist processed with her, encouraging her to consider how she can respond with patience and be gentle with herself when she has a response based on experiences from her marriage. She is scheduled to be seen again in two weeks.     Treatment Plan Client Abilities/Strengths  [name] described herself as open to listen to insights from others.  Client Treatment Preferences:  [name] shared that she is ok with appointments during the daytime, but there is a chance she may need to change her appointments to better fit her work schedule. She prefers appointments on Mondays, Wednesdays, or Fridays.  Client Statement of Needs  Neveah shared that she is in need of a professional outlet for stress.  Treatment Level  She shared that she would probably benefit from biweekly sessions.   Symptoms  Problems Addressed  [name] is going through a divorce that marks the end of a 10-year relationship, as well as the losses of several close family members. She described her childhood as "chaotic," with ongoing stress related to her father's health concerns.  Goals 1. Linet will be provided with support and, when appropriate, strategies to cope with her current situation. Objective [name] will develop coping strategies and increase overall motivation.  Target Date: 11/22/2024 Frequency: Biweekly  Progress: 75 Modality: Individual   Related Interventions [name] will be provided with opportunities to process her experiences in session Therapist will help [name] to notice and disengage from maladaptive thoughts and behaviors using CBT-based  strategies [name] will have opportunities to process experiences from her childhood in order to better understand their current impact Therapist will provide referrals for additional resources as appropriate  Diagnosis Axis none Major Depression, Recurrent, Moderate (F33.2) PTSD, Chronic    Axis none    Conditions For Discharge Achievement of treatment goals and objectives   Intake  Presenting Problem   [name] presented seeking individual therapy. She shared that she has had one of the hardest years of her life. She suffered COVID in September, followed by pneumonia, followed by bronchitis. She and her wife separated but were still living together at time of intake. Since 01/18/2026her  grandmother died, then her father passed away three weeks later. She had not been in contact with either for mental health purposes for 10 years. Her ex-wife suffered a motorcycle accident two weeks before the initial session and Isamar was caring for her during her recovery. Suicidal ideation and intent were denied. Symptoms  Fatigue/lack of energy, sleep disturbances including vivid dreams, she shared a general sense of having a low battery/emotional exhaustion, difficulty connecting with emotions and a sense of "pushing through" History of Problem   [name] suffers from polycystic ovarian syndrome. End of 2017/2018 she was taking different hormone based birth controls. 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 These lasted roughly two weeks. She reported that she "woke up and cried the whole day" for two weeks. She attended therapy during this time for about two months. She also began taking antidepressant medication. She also attended therapy in 2012 following a bad breakup. She first  began taking antidepressants in 2018, but reported a history of "taking and stopping them." She has taken Lexapro, buspar,  trazodone, and has spent the most time taking citalopram. She reported some fear of taking new medication due to her experiences with an uncle who took Wellbutrin and had a terrible suicidal ideation episode that resulted in him going missing for three days. She shared that she has similar medication reactions to her mother. She was not taking any prescription medications at time of intake. She is prescribed zetbound and Vyvanse but has not been able to access Vyvanse due to the shortage and has not been actively taking either. Kaydee revealed that she had caught her father stalking her home 6 months prior to his death. Recent Trigger     Marital and Family Information  She and her wife had been married for over 8 years, and together for 10 years prior to divorcing. She shared that she had a high school relationship that ended prior to college, several relationships in college, and met her wife and married her by 75.    Present family concerns/problems: Tristen is very close with her mother. She has a sister who is 66 years older with whom she has a good relationship. Her father died in 10/06/23. She has extended family  she sees on holidays. She has not communicated with anyone on her father's side for ten years except for one uncle. She described her father as an addict since before she was born. He underwent a quadruple bypass when [name] was 10, and became addicted to prescription painkillers following this. She described him as manipulative and narcisstic toward people he was close with. She reported frequent drama in family relationships having to do with her father's addictions, and aspects of her childhood as "chaotic." She shared that she had been "daddy's little girl" prior to maturing and becoming more aware of his challenges. She shared that she had only spoken with her father twice in the 10 years leading to his death. She also reported writing him a long letter that he did not respond to, which  was a hurtful experience for her. He reportedly died as a result of a heart attack three weeks after the death of [name]'s grandmother, who died following a fall.   Strengths/resources in the family/friends:  She shared that she participates in an active friend community online. She has met and planned trips with several people from the group. She is still close with two of her best friends from childhood. She also maintains a close relationship with her older sister and several work friends. However, she described herself as less social than pre-COVID. Since her divorce, Cassandra Ibarra has been involved in an online romantic relationship since late 2024.    Family background / ethnic factors: Terie shared that her wife was a cisgendered female for the beginning of their relationship, but transitioned to female in 2019. She described this as very difficult for her, and that there were "no warning signs." She shared that prior to the transition, her wife had been very masculine, and described the experience as being "like my husband died." Looking back, she wonders if they should have ended the relationship at that time. Brandi shard that her wife has a history of hiding the truth not to hurt people, but eventually revealed that she was no longer attracted to women. She reported that this revelation had prompted her to grieve her husband and earlier stages of the relationship, but not her wife.  No needs/concerns related to ethnicity reported when asked: No  Education/Vocation   Interpersonal concerns/problems: None. 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 Personal strengths: Ceazia shared that she is continuing to function despite challenges, she actively looks for the positive. In the year since starting therapy, she has made significant strides toward establishing healthy habits, including showering regularly, initiating a skincare routine,  meal-prepping, cleaning her home, and losing 60 lbs. Military/work problems/concerns: None  Leisure Activities/Daily Functioning : no changes noted. Enjoys online social activities, including talking and gaming Legal Status  No Legal Problems: No Medical/Nutritional Concerns   Comments: polycystic ovarian syndrome, ADHD, fibromyalgia, questions whether she may be on the spectrum   Substance use/abuse/dependence: Harnoor described herself as a social drinker, who drinks occasionally (every other week). She has previously "dabbled" with edibles, but found it difficult to find a good dose.    Comments:    Religion/Spirituality: Lamia described herself as leaning toward agnosticism. She suspects she may have religious trauma from childhood.    General Behavior: WNL Attire: WNL Gait: not observed-telehealth Motor Activity: WNL Stream of Thought - Productivity: WNL Stream of thought - Progression: WNL Stream of thought - Language:  WNL Emotional tone and reactions - Mood: WNL  Emotional tone and reactions - Affect: WNL Mental trend/Content of thoughts - Perception: WNL Mental trend/Content of thoughts - Orientation: WNL Mental trend/Content of thoughts - Memory: WNL Mental trend/Content of thoughts - General knowledge: WNL Insight: WNL Judgment: WNL Intelligence: WNL Mental Status Comment: WNL Diagnostic Summary Major Depression, Recurrent, Moderate (F33.2), Post Traumatic Stress disorder, chronic (F43.12)               [name], PhD               [name], PhD

## 2023-12-21 ENCOUNTER — Ambulatory Visit (INDEPENDENT_AMBULATORY_CARE_PROVIDER_SITE_OTHER): Payer: 59 | Admitting: Clinical

## 2023-12-21 DIAGNOSIS — F4312 Post-traumatic stress disorder, chronic: Secondary | ICD-10-CM | POA: Diagnosis not present

## 2023-12-21 DIAGNOSIS — F332 Major depressive disorder, recurrent severe without psychotic features: Secondary | ICD-10-CM

## 2023-12-21 DIAGNOSIS — F331 Major depressive disorder, recurrent, moderate: Secondary | ICD-10-CM | POA: Diagnosis not present

## 2023-12-21 NOTE — Progress Notes (Signed)
 Time: 9:02am -9:50 am CPT Code: [id number] Diagnosis: F33.2, F43.12  Cassandra Ibarra was seen remotely using secure video conferencing. She was in her office at work and therapist was in her at office time of the appointment. Client is aware of risks of telehealth and consented to a virtual visit. She reported upon self-care over her spring break, including cleaning out her clothes, purchasing new clothes, and getting her hair and nails done, connecting this to an increased desire for and comfort with self-expression as a result of her ongoing weight loss. She reported that her mood has been stable and positive. Session focused on processing developments in her relationships. She is scheduled to be seen again in two weeks.     Treatment Plan Client Abilities/Strengths  Emberly described herself as open to listen to insights from others.  Client Treatment Preferences:  Maidie shared that she is ok with appointments during the daytime, but there is a chance she may need to change her appointments to better fit her work schedule. She prefers appointments on Mondays, Wednesdays, or Fridays.  Client Statement of Needs  Khila shared that she is in need of a professional outlet for stress.  Treatment Level  She shared that she would probably benefit from biweekly sessions.   Symptoms  Problems Addressed  Mattea is going through a divorce that marks the end of a 10-year relationship, as well as the losses of several close family members. She described her childhood as "chaotic," with ongoing stress related to her father's health concerns.  Goals 1. Rusti will be provided with support and, when appropriate, strategies to cope with her current situation. Objective Naylani will develop coping strategies and increase overall motivation.  Target Date: 11/22/2024 Frequency: Biweekly  Progress: 75 Modality: Individual   Related Interventions Shaquera will be provided with opportunities to process her  experiences in session Therapist will help Beonka to notice and disengage from maladaptive thoughts and behaviors using CBT-based strategies Konica will have opportunities to process experiences from her childhood in order to better understand their current impact Therapist will provide referrals for additional resources as appropriate  Diagnosis Axis none Major Depression, Recurrent, Moderate (F33.2) PTSD, Chronic    Axis none    Conditions For Discharge Achievement of treatment goals and objectives   Intake  Presenting Problem   Shevelle presented seeking individual therapy. She shared that she has had one of the hardest years of her life. She suffered COVID in September, followed by pneumonia, followed by bronchitis. She and her wife separated but were still living together at time of intake. Since 01/17/26her  grandmother died, then her father passed away three weeks later. She had not been in contact with either for mental health purposes for 10 years. Her ex-wife suffered a motorcycle accident two weeks before the initial session and Jeanita was caring for her during her recovery. Suicidal ideation and intent were denied. Symptoms  Fatigue/lack of energy, sleep disturbances including vivid dreams, she shared a general sense of having a low battery/emotional exhaustion, difficulty connecting with emotions and a sense of "pushing through" History of Problem   Jaylah suffers from polycystic ovarian syndrome. End of 2017/2018 she was taking different hormone based birth controls. 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 These lasted roughly two weeks. She reported that she "woke up and cried the whole day" for two weeks. She attended therapy during this time for about  two months. She also began taking antidepressant medication. She also attended therapy in 2012 following a bad breakup.  She first began taking antidepressants in 2018, but reported a history of "taking and stopping them." She has taken Lexapro, buspar, trazodone, and has spent the most time taking citalopram . She reported some fear of taking new medication due to her experiences with an uncle who took Wellbutrin and had a terrible suicidal ideation episode that resulted in him going missing for three days. She shared that she has similar medication reactions to her mother. She was not taking any prescription medications at time of intake. She is prescribed zetbound and Vyvanse but has not been able to access Vyvanse due to the shortage and has not been actively taking either. Tahj revealed that she had caught her father stalking her home 6 months prior to his death. Recent Trigger     Marital and Family Information  She and her wife had been married for over 8 years, and together for 10 years prior to divorcing. She shared that she had a high school relationship that ended prior to college, several relationships in college, and met her wife and married her by 53.    Present family concerns/problems: Tykera is very close with her mother. She has a sister who is 51 years older with whom she has a good relationship. Her father died in 09/30/2023. She has extended family  she sees on holidays. She has not communicated with anyone on her father's side for ten years except for one uncle. She described her father as an addict since before she was born. He underwent a quadruple bypass when Bradley was 10, and became addicted to prescription painkillers following this. She described him as manipulative and narcisstic toward people he was close with. She reported frequent drama in family relationships having to do with her father's addictions, and aspects of her childhood as "chaotic." She shared that she had been "daddy's little girl" prior to maturing and becoming more aware of his challenges. She shared that she had only spoken with  her father twice in the 10 years leading to his death. She also reported writing him a long letter that he did not respond to, which was a hurtful experience for her. He reportedly died as a result of a heart attack three weeks after the death of Lashandra's grandmother, who died following a fall.   Strengths/resources in the family/friends:  She shared that she participates in an active friend community online. She has met and planned trips with several people from the group. She is still close with two of her best friends from childhood. She also maintains a close relationship with her older sister and several work friends. However, she described herself as less social than pre-COVID. Since her divorce, Cassandra Ibarra has been involved in an online romantic relationship since late 2024.    Family background / ethnic factors: Francenia shared that her wife was a cisgendered female for the beginning of their relationship, but transitioned to female in 2019. She described this as very difficult for her, and that there were "no warning signs." She shared that prior to the transition, her wife had been very masculine, and described the experience as being "like my husband died." Looking back, she wonders if they should have ended the relationship at that time. Otelia shard that her wife has a history of hiding the truth not to hurt people, but eventually revealed that she was no longer attracted to women. She reported  that this revelation had prompted her to grieve her husband and earlier stages of the relationship, but not her wife.    No needs/concerns related to ethnicity reported when asked: No  Education/Vocation   Interpersonal concerns/problems: None. 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 Personal strengths: Malloree shared that she is continuing to function despite challenges, she actively looks for the positive. In the year since starting therapy, she  has made significant strides toward establishing healthy habits, including showering regularly, initiating a skincare routine, meal-prepping, cleaning her home, and losing 60 lbs. Military/work problems/concerns: None  Leisure Activities/Daily Functioning : no changes noted. Enjoys online social activities, including talking and gaming Legal Status  No Legal Problems: No Medical/Nutritional Concerns   Comments: polycystic ovarian syndrome, ADHD, fibromyalgia, questions whether she may be on the spectrum   Substance use/abuse/dependence: Marjorie described herself as a social drinker, who drinks occasionally (every other week). She has previously "dabbled" with edibles, but found it difficult to find a good dose.    Comments:    Religion/Spirituality: Anabia described herself as leaning toward agnosticism. She suspects she may have religious trauma from childhood.    General Behavior: WNL Attire: WNL Gait: not observed-telehealth Motor Activity: WNL Stream of Thought - Productivity: WNL Stream of thought - Progression: WNL Stream of thought - Language:  WNL Emotional tone and reactions - Mood: WNL  Emotional tone and reactions - Affect: WNL Mental trend/Content of thoughts - Perception: WNL Mental trend/Content of thoughts - Orientation: WNL Mental trend/Content of thoughts - Memory: WNL Mental trend/Content of thoughts - General knowledge: WNL Insight: WNL Judgment: WNL Intelligence: WNL Mental Status Comment: WNL Diagnostic Summary Major Depression, Recurrent, Moderate (F33.2), Post Traumatic Stress disorder, chronic (F43.12)          Arlene Lacy, PhD               Arlene Lacy, PhD

## 2023-12-30 ENCOUNTER — Other Ambulatory Visit: Payer: Self-pay | Admitting: Nurse Practitioner

## 2023-12-30 DIAGNOSIS — N912 Amenorrhea, unspecified: Secondary | ICD-10-CM

## 2024-01-01 ENCOUNTER — Inpatient Hospital Stay: Admission: RE | Admit: 2024-01-01 | Source: Ambulatory Visit

## 2024-01-04 ENCOUNTER — Ambulatory Visit (INDEPENDENT_AMBULATORY_CARE_PROVIDER_SITE_OTHER): Payer: 59 | Admitting: Clinical

## 2024-01-04 DIAGNOSIS — F4312 Post-traumatic stress disorder, chronic: Secondary | ICD-10-CM | POA: Diagnosis not present

## 2024-01-04 DIAGNOSIS — F332 Major depressive disorder, recurrent severe without psychotic features: Secondary | ICD-10-CM

## 2024-01-04 NOTE — Progress Notes (Signed)
 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 She was in her office at work and therapist was in her at office time of the appointment. Client is aware of risks of telehealth and consented to a virtual visit. She reported that, with the exception of work-related stress, her mood continues to be stable and she has been able to maintain self-care practices. Session focused on dynamics in her relationship with her mother. Therapist provided psychoeducation on boundary setting, and suggested communication strategies. She is scheduled to be seen again in one weeks.     Treatment Plan Client Abilities/Strengths  Cassandra Ibarra described herself as open to listen to insights from others.  Client Treatment Preferences:  Cassandra Ibarra shared that she is ok with appointments during the daytime, but there is a chance she may need to change her appointments to better fit her work schedule. She prefers appointments on Mondays, Wednesdays, or Fridays.  Client Statement of Needs  Cassandra Ibarra shared that she is in need of a professional outlet for stress.  Treatment Level  She shared that she would probably benefit from biweekly sessions.   Symptoms  Problems Addressed  Cassandra Ibarra is going through a divorce that marks the end of a 10-year relationship, as well as the losses of several close family members. She described her childhood as "chaotic," with ongoing stress related to her father's health concerns.  Goals 1. Cassandra Ibarra will be provided with support and, when appropriate, strategies to cope with her current situation. Objective Cassandra Ibarra will develop coping strategies and increase overall motivation.  Target Date: 11/22/2024 Frequency: Biweekly  Progress: 75 Modality: Individual   Related Interventions Cassandra Ibarra will be provided with opportunities to process her experiences in session Therapist will help Cassandra Ibarra to notice and disengage from  maladaptive thoughts and behaviors using CBT-based strategies Cassandra Ibarra will have opportunities to process experiences from her childhood in order to better understand their current impact Therapist will provide referrals for additional resources as appropriate  Diagnosis Axis none Major Depression, Recurrent, Moderate (F33.2) PTSD, Chronic    Axis none    Conditions For Discharge Achievement of treatment goals and objectives   Intake  Presenting Problem   Cassandra Ibarra presented seeking individual therapy. She shared that she has had one of the hardest years of her life. She suffered COVID in September, followed by pneumonia, followed by bronchitis. She and her wife separated but were still living together at time of intake. Since Jan 26, 2026her  grandmother died, then her father passed away three weeks later. She had not been in contact with either for mental health purposes for 10 years. Her ex-wife suffered a motorcycle accident two weeks before the initial session and Cassandra Ibarra was caring for her during her recovery. Suicidal ideation and intent were denied. Symptoms  Fatigue/lack of energy, sleep disturbances including vivid dreams, she shared a general sense of having a low battery/emotional exhaustion, difficulty connecting with emotions and a sense of "pushing through" History of Problem   Cassandra Ibarra suffers from polycystic ovarian syndrome. End of 2017/2018 she was taking different hormone based birth controls. 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 These lasted roughly two weeks. She reported that she "woke up and cried the whole day" for two weeks. She attended therapy during this time for about two months. She also began taking antidepressant medication. She also attended therapy in 2012 following a  bad breakup. She first began taking antidepressants in 2018, but reported a history of "taking  and stopping them." She has taken Lexapro, buspar, trazodone, and has spent the most time taking citalopram . She reported some fear of taking new medication due to her experiences with an uncle who took Wellbutrin and had a terrible suicidal ideation episode that resulted in him going missing for three days. She shared that she has similar medication reactions to her mother. She was not taking any prescription medications at time of intake. She is prescribed zetbound and Vyvanse but has not been able to access Vyvanse due to the shortage and has not been actively taking either. Cassandra Ibarra revealed that she had caught her father stalking her home 6 months prior to his death. Recent Trigger     Marital and Family Information  She and her wife had been married for over 8 years, and together for 10 years prior to divorcing. She shared that she had a high school relationship that ended prior to college, several relationships in college, and met her wife and married her by 36.    Present family concerns/problems: Cassandra Ibarra is very close with her mother. She has a sister who is 72 years older with whom she has a good relationship. Her father died in 09/19/2023. She has extended family  she sees on holidays. She has not communicated with anyone on her father's side for ten years except for one uncle. She described her father as an addict since before she was born. He underwent a quadruple bypass when Cassandra Ibarra was 10, and became addicted to prescription painkillers following this. She described him as manipulative and narcisstic toward people he was close with. She reported frequent drama in family relationships having to do with her father's addictions, and aspects of her childhood as "chaotic." She shared that she had been "daddy's little girl" prior to maturing and becoming more aware of his challenges. She shared that she had only spoken with her father twice in the 10 years leading to his death. She also reported writing  him a long letter that he did not respond to, which was a hurtful experience for her. He reportedly died as a result of a heart attack three weeks after the death of Cassandra Ibarra's grandmother, who died following a fall.   Strengths/resources in the family/friends:  She shared that she participates in an active friend community online. She has met and planned trips with several people from the group. She is still close with two of her best friends from childhood. She also maintains a close relationship with her older sister and several work friends. However, she described herself as less social than pre-COVID. Since her divorce, Cassandra Ibarra has been involved in an online romantic relationship since late 2024.    Family background / ethnic factors: Mayerli shared that her wife was a cisgendered female for the beginning of their relationship, but transitioned to female in 2019. She described this as very difficult for her, and that there were "no warning signs." She shared that prior to the transition, her wife had been very masculine, and described the experience as being "like my husband died." Looking back, she wonders if they should have ended the relationship at that time. Cabela shard that her wife has a history of hiding the truth not to hurt people, but eventually revealed that she was no longer attracted to women. She reported that this revelation had prompted her to grieve her husband and earlier stages of the relationship,  but not her wife.    No needs/concerns related to ethnicity reported when asked: No  Education/Vocation   Interpersonal concerns/problems: None. 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 Personal strengths: Pairlee shared that she is continuing to function despite challenges, she actively looks for the positive. In the year since starting therapy, she has made significant strides toward establishing healthy habits, including  showering regularly, initiating a skincare routine, meal-prepping, cleaning her home, and losing 60 lbs. Military/work problems/concerns: None  Leisure Activities/Daily Functioning : no changes noted. Enjoys online social activities, including talking and gaming Legal Status  No Legal Problems: No Medical/Nutritional Concerns   Comments: polycystic ovarian syndrome, ADHD, fibromyalgia, questions whether she may be on the spectrum   Substance use/abuse/dependence: Para described herself as a social drinker, who drinks occasionally (every other week). She has previously "dabbled" with edibles, but found it difficult to find a good dose.    Comments:    Religion/Spirituality: Takila described herself as leaning toward agnosticism. She suspects she may have religious trauma from childhood.    General Behavior: WNL Attire: WNL Gait: not observed-telehealth Motor Activity: WNL Stream of Thought - Productivity: WNL Stream of thought - Progression: WNL Stream of thought - Language:  WNL Emotional tone and reactions - Mood: WNL  Emotional tone and reactions - Affect: WNL Mental trend/Content of thoughts - Perception: WNL Mental trend/Content of thoughts - Orientation: WNL Mental trend/Content of thoughts - Memory: WNL Mental trend/Content of thoughts - General knowledge: WNL Insight: WNL Judgment: WNL Intelligence: WNL Mental Status Comment: WNL Diagnostic Summary Major Depression, Recurrent, Moderate (F33.2), Post Traumatic Stress disorder, chronic (F43.12)   Arlene Lacy, PhD               Arlene Lacy, PhD

## 2024-01-14 ENCOUNTER — Ambulatory Visit: Admitting: Clinical

## 2024-01-14 DIAGNOSIS — F332 Major depressive disorder, recurrent severe without psychotic features: Secondary | ICD-10-CM

## 2024-01-14 DIAGNOSIS — F4312 Post-traumatic stress disorder, chronic: Secondary | ICD-10-CM

## 2024-01-14 NOTE — Progress Notes (Signed)
 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 She was in her office at work and therapist was in her at office time of the appointment. Client is aware of risks of telehealth and consented to a virtual visit. Session focused on processing stressors that had arisen at work. Therapist offered validation and support, encouraging [name] to prioritize her emotional well-being. She is scheduled to be seen again in two weeks.     Treatment Plan Client Abilities/Strengths  [name] described herself as open to listen to insights from others.  Client Treatment Preferences:  [name] shared that she is ok with appointments during the daytime, but there is a chance she may need to change her appointments to better fit her work schedule. She prefers appointments on Mondays, Wednesdays, or Fridays.  Client Statement of Needs  [name] shared that she is in need of a professional outlet for stress.  Treatment Level  She shared that she would probably benefit from biweekly sessions.   Symptoms  Problems Addressed  [name] is going through a divorce that marks the end of a 10-year relationship, as well as the losses of several close family members. She described her childhood as "chaotic," with ongoing stress related to her father's health concerns.  Goals 1. [name] will be provided with support and, when appropriate, strategies to cope with her current situation. Objective [name] will develop coping strategies and increase overall motivation.  Target Date: 11/22/2024 Frequency: Biweekly  Progress: 75 Modality: Individual   Related Interventions [name] will be provided with opportunities to process her experiences in session Therapist will help [name] to notice and disengage from maladaptive thoughts and behaviors using CBT-based strategies [name] will have opportunities to process experiences from her childhood in order to  better understand their current impact Therapist will provide referrals for additional resources as appropriate  Diagnosis Axis none Major Depression, Recurrent, Moderate (F33.2) PTSD, Chronic    Axis none    Conditions For Discharge Achievement of treatment goals and objectives   Intake  Presenting Problem   [name] presented seeking individual therapy. She shared that she has had one of the hardest years of her life. She suffered COVID in September, followed by pneumonia, followed by bronchitis. She and her wife separated but were still living together at time of intake. Since Jan 30, 2026her  grandmother died, then her father passed away three weeks later. She had not been in contact with either for mental health purposes for 10 years. Her ex-wife suffered a motorcycle accident two weeks before the initial session and [name] was caring for her during her recovery. Suicidal ideation and intent were denied. Symptoms  Fatigue/lack of energy, sleep disturbances including vivid dreams, she shared a general sense of having a low battery/emotional exhaustion, difficulty connecting with emotions and a sense of "pushing through" History of Problem   [name] suffers from polycystic ovarian syndrome. End of 2017/2018 she was taking different hormone based birth controls. 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 These lasted roughly two weeks. She reported that she "woke up and cried the whole day" for two weeks. She attended therapy during this time for about two months. She also began taking antidepressant medication. She also attended therapy in 2012 following a bad breakup. She first began taking antidepressants in 2018, but reported a history of "taking and stopping them." She has taken Lexapro,  buspar, trazodone, and has spent the most time taking citalopram . She reported some fear of taking new  medication due to her experiences with an uncle who took Wellbutrin and had a terrible suicidal ideation episode that resulted in him going missing for three days. She shared that she has similar medication reactions to her mother. She was not taking any prescription medications at time of intake. She is prescribed zetbound and Vyvanse but has not been able to access Vyvanse due to the shortage and has not been actively taking either. [name] revealed that she had caught her father stalking her home 6 months prior to his death. Recent Trigger     Marital and Family Information  She and her wife had been married for over 8 years, and together for 10 years prior to divorcing. She shared that she had a high school relationship that ended prior to college, several relationships in college, and met her wife and married her by 29.    Present family concerns/problems: Thelia is very close with her mother. She has a sister who is 70 years older with whom she has a good relationship. Her father died in 10-02-2023. She has extended family  she sees on holidays. She has not communicated with anyone on her father's side for ten years except for one uncle. She described her father as an addict since before she was born. He underwent a quadruple bypass when [name] was 10, and became addicted to prescription painkillers following this. She described him as manipulative and narcisstic toward people he was close with. She reported frequent drama in family relationships having to do with her father's addictions, and aspects of her childhood as "chaotic." She shared that she had been "daddy's little girl" prior to maturing and becoming more aware of his challenges. She shared that she had only spoken with her father twice in the 10 years leading to his death. She also reported writing him a long letter that he did not respond to, which was a hurtful experience for her. He reportedly died as a result of a heart attack three weeks  after the death of [name]'s grandmother, who died following a fall.   Strengths/resources in the family/friends:  She shared that she participates in an active friend community online. She has met and planned trips with several people from the group. She is still close with two of her best friends from childhood. She also maintains a close relationship with her older sister and several work friends. However, she described herself as less social than pre-COVID. Since her divorce, Cassandra Ibarra has been involved in an online romantic relationship since late 2024.    Family background / ethnic factors: Jerusha shared that her wife was a cisgendered female for the beginning of their relationship, but transitioned to female in 2019. She described this as very difficult for her, and that there were "no warning signs." She shared that prior to the transition, her wife had been very masculine, and described the experience as being "like my husband died." Looking back, she wonders if they should have ended the relationship at that time. Glori shard that her wife has a history of hiding the truth not to hurt people, but eventually revealed that she was no longer attracted to women. She reported that this revelation had prompted her to grieve her husband and earlier stages of the relationship, but not her wife.    No needs/concerns related to ethnicity reported when asked: No  Education/Vocation   Interpersonal concerns/problems:  None. 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 Personal strengths: Geoffrey shared that she is continuing to function despite challenges, she actively looks for the positive. In the year since starting therapy, she has made significant strides toward establishing healthy habits, including showering regularly, initiating a skincare routine, meal-prepping, cleaning her home, and losing 60 lbs. Military/work problems/concerns: None  Leisure  Activities/Daily Functioning : no changes noted. Enjoys online social activities, including talking and gaming Legal Status  No Legal Problems: No Medical/Nutritional Concerns   Comments: polycystic ovarian syndrome, ADHD, fibromyalgia, questions whether she may be on the spectrum   Substance use/abuse/dependence: Kaelah described herself as a social drinker, who drinks occasionally (every other week). She has previously "dabbled" with edibles, but found it difficult to find a good dose.    Comments:    Religion/Spirituality: Azizah described herself as leaning toward agnosticism. She suspects she may have religious trauma from childhood.    General Behavior: WNL Attire: WNL Gait: not observed-telehealth Motor Activity: WNL Stream of Thought - Productivity: WNL Stream of thought - Progression: WNL Stream of thought - Language:  WNL Emotional tone and reactions - Mood: WNL  Emotional tone and reactions - Affect: WNL Mental trend/Content of thoughts - Perception: WNL Mental trend/Content of thoughts - Orientation: WNL Mental trend/Content of thoughts - Memory: WNL Mental trend/Content of thoughts - General knowledge: WNL Insight: WNL Judgment: WNL Intelligence: WNL Mental Status Comment: WNL Diagnostic Summary Major Depression, Recurrent, Moderate (F33.2), Post Traumatic Stress disorder, chronic (F43.12)    Arlene Lacy, PhD               Arlene Lacy, PhD

## 2024-01-27 ENCOUNTER — Ambulatory Visit: Admitting: Clinical

## 2024-02-11 ENCOUNTER — Ambulatory Visit: Admitting: Clinical

## 2024-02-11 DIAGNOSIS — F4312 Post-traumatic stress disorder, chronic: Secondary | ICD-10-CM

## 2024-02-11 DIAGNOSIS — F332 Major depressive disorder, recurrent severe without psychotic features: Secondary | ICD-10-CM

## 2024-02-11 NOTE — Progress Notes (Signed)
 Time: 1:00 pm -1:54 pm CPT Code: [id number] Diagnosis: F33.2, F43.12  Cassandra Ibarra was seen remotely using secure video conferencing. She was in her office at work and therapist was in her at office time of the appointment. Client is aware of risks of telehealth and consented to a virtual visit. She reported upon several stressful events that had occurred in the previous weeks, and therapist offered validation and support. Cassandra Ibarra also reflected upon issues that had arisen in her relationship which she suspects may be due to trauma from her past relationship. Therapist suggested several exercises to calm her nervous system, including restorative yoga, deep breathing, and activating the DIVE reflect. She is scheduled to be seen again in two weeks.     Treatment Plan Client Abilities/Strengths  Cierra described herself as open to listen to insights from others.  Client Treatment Preferences:  Alexxa shared that she is ok with appointments during the daytime, but there is a chance she may need to change her appointments to better fit her work schedule. She prefers appointments on Mondays, Wednesdays, or Fridays.  Client Statement of Needs  Latina shared that she is in need of a professional outlet for stress.  Treatment Level  She shared that she would probably benefit from biweekly sessions.   Symptoms  Problems Addressed  Aika is going through a divorce that marks the end of a 10-year relationship, as well as the losses of several close family members. She described her childhood as "chaotic," with ongoing stress related to her father's health concerns.  Goals 1. Mieshia will be provided with support and, when appropriate, strategies to cope with her current situation. Objective Shlonda will develop coping strategies and increase overall motivation.  Target Date: 11/22/2024 Frequency: Biweekly  Progress: 75 Modality: Individual   Related Interventions Keliah will be provided with opportunities to  process her experiences in session Therapist will help Kateryna to notice and disengage from maladaptive thoughts and behaviors using CBT-based strategies Nehemiah will have opportunities to process experiences from her childhood in order to better understand their current impact Therapist will provide referrals for additional resources as appropriate  Diagnosis Axis none Major Depression, Recurrent, Moderate (F33.2) PTSD, Chronic    Axis none    Conditions For Discharge Achievement of treatment goals and objectives   Intake  Presenting Problem   Aslee presented seeking individual therapy. She shared that she has had one of the hardest years of her life. She suffered COVID in September, followed by pneumonia, followed by bronchitis. She and her wife separated but were still living together at time of intake. Since 01-11-26her  grandmother died, then her father passed away three weeks later. She had not been in contact with either for mental health purposes for 10 years. Her ex-wife suffered a motorcycle accident two weeks before the initial session and Dahiana was caring for her during her recovery. Suicidal ideation and intent were denied. Symptoms  Fatigue/lack of energy, sleep disturbances including vivid dreams, she shared a general sense of having a low battery/emotional exhaustion, difficulty connecting with emotions and a sense of "pushing through" History of Problem   Laykin suffers from polycystic ovarian syndrome. End of 2017/2018 she was taking different hormone based birth controls. 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 These lasted roughly two weeks. She reported that she "woke up and cried the whole day" for two weeks. She attended therapy during this time  for about two months. She also began taking antidepressant medication. She also attended therapy in 2012 following a  bad breakup. She first began taking antidepressants in 2018, but reported a history of "taking and stopping them." She has taken Lexapro, buspar, trazodone, and has spent the most time taking citalopram . She reported some fear of taking new medication due to her experiences with an uncle who took Wellbutrin and had a terrible suicidal ideation episode that resulted in him going missing for three days. She shared that she has similar medication reactions to her mother. She was not taking any prescription medications at time of intake. She is prescribed zetbound and Vyvanse but has not been able to access Vyvanse due to the shortage and has not been actively taking either. Nataliyah revealed that she had caught her father stalking her home 6 months prior to his death. Recent Trigger     Marital and Family Information  She and her wife had been married for over 8 years, and together for 10 years prior to divorcing. She shared that she had a high school relationship that ended prior to college, several relationships in college, and met her wife and married her by 55.    Present family concerns/problems: Lanny is very close with her mother. She has a sister who is 65 years older with whom she has a good relationship. Her father died in 10/13/23. She has extended family  she sees on holidays. She has not communicated with anyone on her father's side for ten years except for one uncle. She described her father as an addict since before she was born. He underwent a quadruple bypass when Kaianna was 10, and became addicted to prescription painkillers following this. She described him as manipulative and narcisstic toward people he was close with. She reported frequent drama in family relationships having to do with her father's addictions, and aspects of her childhood as "chaotic." She shared that she had been "daddy's little girl" prior to maturing and becoming more aware of his challenges. She shared that she had  only spoken with her father twice in the 10 years leading to his death. She also reported writing him a long letter that he did not respond to, which was a hurtful experience for her. He reportedly died as a result of a heart attack three weeks after the death of Jisele's grandmother, who died following a fall.   Strengths/resources in the family/friends:  She shared that she participates in an active friend community online. She has met and planned trips with several people from the group. She is still close with two of her best friends from childhood. She also maintains a close relationship with her older sister and several work friends. However, she described herself as less social than pre-COVID. Since her divorce, Cassandra Ibarra has been involved in an online romantic relationship since late 2024.    Family background / ethnic factors: Sharlet shared that her wife was a cisgendered female for the beginning of their relationship, but transitioned to female in 2019. She described this as very difficult for her, and that there were "no warning signs." She shared that prior to the transition, her wife had been very masculine, and described the experience as being "like my husband died." Looking back, she wonders if they should have ended the relationship at that time. Amara shard that her wife has a history of hiding the truth not to hurt people, but eventually revealed that she was no longer attracted to women.  She reported that this revelation had prompted her to grieve her husband and earlier stages of the relationship, but not her wife.    No needs/concerns related to ethnicity reported when asked: No  Education/Vocation   Interpersonal concerns/problems: None. 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 Personal strengths: Eilah shared that she is continuing to function despite challenges, she actively looks for the positive. In the year since  starting therapy, she has made significant strides toward establishing healthy habits, including showering regularly, initiating a skincare routine, meal-prepping, cleaning her home, and losing 60 lbs. Military/work problems/concerns: None  Leisure Activities/Daily Functioning : no changes noted. Enjoys online social activities, including talking and gaming Legal Status  No Legal Problems: No Medical/Nutritional Concerns   Comments: polycystic ovarian syndrome, ADHD, fibromyalgia, questions whether she may be on the spectrum   Substance use/abuse/dependence: Jossilyn described herself as a social drinker, who drinks occasionally (every other week). She has previously "dabbled" with edibles, but found it difficult to find a good dose.    Comments:    Religion/Spirituality: Aleksandra described herself as leaning toward agnosticism. She suspects she may have religious trauma from childhood.    General Behavior: WNL Attire: WNL Gait: not observed-telehealth Motor Activity: WNL Stream of Thought - Productivity: WNL Stream of thought - Progression: WNL Stream of thought - Language:  WNL Emotional tone and reactions - Mood: WNL  Emotional tone and reactions - Affect: WNL Mental trend/Content of thoughts - Perception: WNL Mental trend/Content of thoughts - Orientation: WNL Mental trend/Content of thoughts - Memory: WNL Mental trend/Content of thoughts - General knowledge: WNL Insight: WNL Judgment: WNL Intelligence: WNL Mental Status Comment: WNL Diagnostic Summary Major Depression, Recurrent, Moderate (F33.2), Post Traumatic Stress disorder, chronic (F43.12)    Arlene Lacy, PhD               Arlene Lacy, PhD

## 2024-02-25 ENCOUNTER — Ambulatory Visit: Admitting: Clinical

## 2024-02-25 NOTE — Progress Notes (Unsigned)
   [name], PhD

## 2024-03-07 ENCOUNTER — Ambulatory Visit (INDEPENDENT_AMBULATORY_CARE_PROVIDER_SITE_OTHER): Admitting: Clinical

## 2024-03-07 DIAGNOSIS — F332 Major depressive disorder, recurrent severe without psychotic features: Secondary | ICD-10-CM | POA: Diagnosis not present

## 2024-03-07 DIAGNOSIS — F4312 Post-traumatic stress disorder, chronic: Secondary | ICD-10-CM

## 2024-03-07 NOTE — Progress Notes (Signed)
 Time: 12:00 pm -12:54 pm CPT Code: [id number] Diagnosis: F33.2, F43.12  Cassandra Ibarra was seen remotely using secure video conferencing. She was in her home and therapist was in her at office time of the appointment. Client is aware of risks of telehealth and consented to a virtual visit. Cassandra Ibarra reported that her beloved pet cat had passed away suddenly since her last session, and she had been struggling since that time. Session focused on processing her grief, which has significantly disrupted her sleep. She has been able to find some comfort in a skincare routine, and therapist encouraged her to explore restructuring her sleep routine (of which her cat was a significant part), and explored with her how she can increase structure in her current routine. She is scheduled to be seen again in one week.     Treatment Plan Client Abilities/Strengths  Cassandra Ibarra described herself as open to listen to insights from others.  Client Treatment Preferences:  Cassandra Ibarra shared that she is ok with appointments during the daytime, but there is a chance she may need to change her appointments to better fit her work schedule. She prefers appointments on Mondays, Wednesdays, or Fridays.  Client Statement of Needs  Cassandra Ibarra shared that she is in need of a professional outlet for stress.  Treatment Level  She shared that she would probably benefit from biweekly sessions.   Symptoms  Problems Addressed  Cassandra Ibarra is going through a divorce that marks the end of a 10-year relationship, as well as the losses of several close family members. She described her childhood as "chaotic," with ongoing stress related to her father's health concerns.  Goals 1. Cassandra Ibarra will be provided with support and, when appropriate, strategies to cope with her current situation. Objective Cassandra Ibarra will develop coping strategies and increase overall motivation.  Target Date: 11/22/2024 Frequency: Biweekly  Progress: 75 Modality: Individual   Related  Interventions Cassandra Ibarra will be provided with opportunities to process her experiences in session Therapist will help Cassandra Ibarra to notice and disengage from maladaptive thoughts and behaviors using CBT-based strategies Cassandra Ibarra will have opportunities to process experiences from her childhood in order to better understand their current impact Therapist will provide referrals for additional resources as appropriate  Diagnosis Axis none Major Depression, Recurrent, Moderate (F33.2) PTSD, Chronic    Axis none    Conditions For Discharge Achievement of treatment goals and objectives   Intake  Presenting Problem   Cassandra Ibarra presented seeking individual therapy. She shared that she has had one of the hardest years of her life. She suffered COVID in September, followed by pneumonia, followed by bronchitis. She and her wife separated but were still living together at time of intake. Since 01/28/26her  grandmother died, then her father passed away three weeks later. She had not been in contact with either for mental health purposes for 10 years. Her ex-wife suffered a motorcycle accident two weeks before the initial session and Cassandra Ibarra was caring for her during her recovery. Suicidal ideation and intent were denied. Symptoms  Fatigue/lack of energy, sleep disturbances including vivid dreams, she shared a general sense of having a low battery/emotional exhaustion, difficulty connecting with emotions and a sense of "pushing through" History of Problem   Cassandra Ibarra suffers from polycystic ovarian syndrome. End of 2017/2018 she was taking different hormone based birth controls. 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 These lasted roughly two weeks. She reported that she "woke  up and cried the whole day" for two weeks. She attended therapy during this time for about two months. She also began taking  antidepressant medication. She also attended therapy in 2012 following a bad breakup. She first began taking antidepressants in 2018, but reported a history of "taking and stopping them." She has taken Lexapro, buspar, trazodone, and has spent the most time taking citalopram . She reported some fear of taking new medication due to her experiences with an uncle who took Wellbutrin and had a terrible suicidal ideation episode that resulted in him going missing for three days. She shared that she has similar medication reactions to her mother. She was not taking any prescription medications at time of intake. She is prescribed zetbound and Vyvanse but has not been able to access Vyvanse due to the shortage and has not been actively taking either. Cassandra Ibarra revealed that she had caught her father stalking her home 6 months prior to his death. Recent Trigger     Marital and Family Information  She and her wife had been married for over 8 years, and together for 10 years prior to divorcing. She shared that she had a high school relationship that ended prior to college, several relationships in college, and met her wife and married her by 54.    Present family concerns/problems: Cassandra Ibarra is very close with her mother. She has a sister who is 58 years older with whom she has a good relationship. Her father died in 08-Oct-2023. She has extended family  she sees on holidays. She has not communicated with anyone on her father's side for ten years except for one uncle. She described her father as an addict since before she was born. He underwent a quadruple bypass when Cassandra Ibarra was 10, and became addicted to prescription painkillers following this. She described him as manipulative and narcisstic toward people he was close with. She reported frequent drama in family relationships having to do with her father's addictions, and aspects of her childhood as "chaotic." She shared that she had been "daddy's little girl" prior to  maturing and becoming more aware of his challenges. She shared that she had only spoken with her father twice in the 10 years leading to his death. She also reported writing him a long letter that he did not respond to, which was a hurtful experience for her. He reportedly died as a result of a heart attack three weeks after the death of Cassandra Ibarra's grandmother, who died following a fall.   Strengths/resources in the family/friends:  She shared that she participates in an active friend community online. She has met and planned trips with several people from the group. She is still close with two of her best friends from childhood. She also maintains a close relationship with her older sister and several work friends. However, she described herself as less social than pre-COVID. Since her divorce, Cassandra Ibarra has been involved in an online romantic relationship since late 2024.    Family background / ethnic factors: Cassandra Ibarra shared that her wife was a cisgendered female for the beginning of their relationship, but transitioned to female in 2019. She described this as very difficult for her, and that there were "no warning signs." She shared that prior to the transition, her wife had been very masculine, and described the experience as being "like my husband died." Looking back, she wonders if they should have ended the relationship at that time. Cassandra Ibarra shard that her wife has a history of hiding the truth  not to hurt people, but eventually revealed that she was no longer attracted to women. She reported that this revelation had prompted her to grieve her husband and earlier stages of the relationship, but not her wife.    No needs/concerns related to ethnicity reported when asked: No  Education/Vocation   Interpersonal concerns/problems: None. 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 Personal strengths: Cassandra Ibarra shared that she is continuing to function  despite challenges, she actively looks for the positive. In the year since starting therapy, she has made significant strides toward establishing healthy habits, including showering regularly, initiating a skincare routine, meal-prepping, cleaning her home, and losing 60 lbs. Military/work problems/concerns: None  Leisure Activities/Daily Functioning : no changes noted. Enjoys online social activities, including talking and gaming Legal Status  No Legal Problems: No Medical/Nutritional Concerns   Comments: polycystic ovarian syndrome, ADHD, fibromyalgia, questions whether she may be on the spectrum   Substance use/abuse/dependence: Cassandra Ibarra described herself as a social drinker, who drinks occasionally (every other week). She has previously "dabbled" with edibles, but found it difficult to find a good dose.    Comments:    Religion/Spirituality: Cassandra Ibarra described herself as leaning toward agnosticism. She suspects she may have religious trauma from childhood.    General Behavior: WNL Attire: WNL Gait: not observed-telehealth Motor Activity: WNL Stream of Thought - Productivity: WNL Stream of thought - Progression: WNL Stream of thought - Language:  WNL Emotional tone and reactions - Mood: WNL  Emotional tone and reactions - Affect: WNL Mental trend/Content of thoughts - Perception: WNL Mental trend/Content of thoughts - Orientation: WNL Mental trend/Content of thoughts - Memory: WNL Mental trend/Content of thoughts - General knowledge: WNL Insight: WNL Judgment: WNL Intelligence: WNL Mental Status Comment: WNL Diagnostic Summary Major Depression, Recurrent, Moderate (F33.2), Post Traumatic Stress disorder, chronic (F43.12)  Cassandra Ibarra LITTIE Ponto, PhD               Cassandra Ibarra LITTIE Ponto, PhD

## 2024-03-15 ENCOUNTER — Ambulatory Visit (INDEPENDENT_AMBULATORY_CARE_PROVIDER_SITE_OTHER): Admitting: Clinical

## 2024-03-15 DIAGNOSIS — F4312 Post-traumatic stress disorder, chronic: Secondary | ICD-10-CM

## 2024-03-15 DIAGNOSIS — F331 Major depressive disorder, recurrent, moderate: Secondary | ICD-10-CM | POA: Diagnosis not present

## 2024-03-15 DIAGNOSIS — F332 Major depressive disorder, recurrent severe without psychotic features: Secondary | ICD-10-CM

## 2024-03-15 NOTE — Progress Notes (Signed)
 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 She was in her home and therapist was in her at office time of the appointment. Client is aware of risks of telehealth and consented to a virtual visit. Cassandra Ibarra reported an improvement in sleep and overall mood since her last session. She reflected upon having been able to maintain healthy habits while grieving her cat's death, as well as her significant progress over the last year. Session also included discussion of Cat's exploration of her identify and self-expression since her divorce.  She is scheduled to be seen again in one week.     Treatment Plan Client Abilities/Strengths  Cassandra Ibarra described herself as open to listen to insights from others.  Client Treatment Preferences:  Cassandra Ibarra shared that she is ok with appointments during the daytime, but there is a chance she may need to change her appointments to better fit her work schedule. She prefers appointments on Mondays, Wednesdays, or Fridays.  Client Statement of Needs  Cassandra Ibarra shared that she is in need of a professional outlet for stress.  Treatment Level  She shared that she would probably benefit from biweekly sessions.   Symptoms  Problems Addressed  Cassandra Ibarra is going through a divorce that marks the end of a 10-year relationship, as well as the losses of several close family members. She described her childhood as "chaotic," with ongoing stress related to her father's health concerns.  Goals 1. Cassandra Ibarra will be provided with support and, when appropriate, strategies to cope with her current situation. Objective Cassandra Ibarra will develop coping strategies and increase overall motivation.  Target Date: 11/22/2024 Frequency: Biweekly  Progress: 75 Modality: Individual   Related Interventions Cassandra Ibarra will be provided with opportunities to process her experiences in session Therapist will help Cassandra Ibarra to notice and  disengage from maladaptive thoughts and behaviors using CBT-based strategies Cassandra Ibarra will have opportunities to process experiences from her childhood in order to better understand their current impact Therapist will provide referrals for additional resources as appropriate  Diagnosis Axis none Major Depression, Recurrent, Moderate (F33.2) PTSD, Chronic    Axis none    Conditions For Discharge Achievement of treatment goals and objectives   Intake  Presenting Problem   Cassandra Ibarra presented seeking individual therapy. She shared that she has had one of the hardest years of her life. She suffered COVID in September, followed by pneumonia, followed by bronchitis. She and her wife separated but were still living together at time of intake. Since 01-25-26her  grandmother died, then her father passed away three weeks later. She had not been in contact with either for mental health purposes for 10 years. Her ex-wife suffered a motorcycle accident two weeks before the initial session and Cassandra Ibarra was caring for her during her recovery. Suicidal ideation and intent were denied. Symptoms  Fatigue/lack of energy, sleep disturbances including vivid dreams, she shared a general sense of having a low battery/emotional exhaustion, difficulty connecting with emotions and a sense of "pushing through" History of Problem   Cassandra Ibarra suffers from polycystic ovarian syndrome. End of 2017/2018 she was taking different hormone based birth controls. 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 These lasted roughly two weeks. She reported that she "woke up and cried the whole day" for two weeks. She attended therapy during this time for about two months. She also began taking antidepressant  medication. She also attended therapy in 2012 following a bad breakup. She first began taking antidepressants in 2018, but reported a  history of "taking and stopping them." She has taken Lexapro, buspar, trazodone, and has spent the most time taking citalopram . She reported some fear of taking new medication due to her experiences with an uncle who took Wellbutrin and had a terrible suicidal ideation episode that resulted in him going missing for three days. She shared that she has similar medication reactions to her mother. She was not taking any prescription medications at time of intake. She is prescribed zetbound and Vyvanse but has not been able to access Vyvanse due to the shortage and has not been actively taking either. Cassandra Ibarra revealed that she had caught her father stalking her home 6 months prior to his death. Recent Trigger     Marital and Family Information  She and her wife had been married for over 8 years, and together for 10 years prior to divorcing. She shared that she had a high school relationship that ended prior to college, several relationships in college, and met her wife and married her by 84.    Present family concerns/problems: Cassandra Ibarra is very close with her mother. She has a sister who is 3 years older with whom she has a good relationship. Her father died in 2023-10-21. She has extended family  she sees on holidays. She has not communicated with anyone on her father's side for ten years except for one uncle. She described her father as an addict since before she was born. He underwent a quadruple bypass when Cassandra Ibarra was 10, and became addicted to prescription painkillers following this. She described him as manipulative and narcisstic toward people he was close with. She reported frequent drama in family relationships having to do with her father's addictions, and aspects of her childhood as "chaotic." She shared that she had been "daddy's little girl" prior to maturing and becoming more aware of his challenges. She shared that she had only spoken with her father twice in the 10 years leading to his death. She  also reported writing him a long letter that he did not respond to, which was a hurtful experience for her. He reportedly died as a result of a heart attack three weeks after the death of Cassandra Ibarra's grandmother, who died following a fall.   Strengths/resources in the family/friends:  She shared that she participates in an active friend community online. She has met and planned trips with several people from the group. She is still close with two of her best friends from childhood. She also maintains a close relationship with her older sister and several work friends. However, she described herself as less social than pre-COVID. Since her divorce, Cassandra Ibarra has been involved in an online romantic relationship since late 2024.    Family background / ethnic factors: Cassandra Ibarra shared that her wife was a cisgendered female for the beginning of their relationship, but transitioned to female in 2019. She described this as very difficult for her, and that there were "no warning signs." She shared that prior to the transition, her wife had been very masculine, and described the experience as being "like my husband died." Looking back, she wonders if they should have ended the relationship at that time. Cassandra Ibarra shard that her wife has a history of hiding the truth not to hurt people, but eventually revealed that she was no longer attracted to women. She reported that this revelation had prompted her to  grieve her husband and earlier stages of the relationship, but not her wife.    No needs/concerns related to ethnicity reported when asked: No  Education/Vocation   Interpersonal concerns/problems: None. 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 Personal strengths: Cassandra Ibarra shared that she is continuing to function despite challenges, she actively looks for the positive. In the year since starting therapy, she has made significant strides toward establishing healthy  habits, including showering regularly, initiating a skincare routine, meal-prepping, cleaning her home, and losing 60 lbs. Military/work problems/concerns: None  Leisure Activities/Daily Functioning : no changes noted. Enjoys online social activities, including talking and gaming Legal Status  No Legal Problems: No Medical/Nutritional Concerns   Comments: polycystic ovarian syndrome, ADHD, fibromyalgia, questions whether she may be on the spectrum   Substance use/abuse/dependence: Cassandra Ibarra described herself as a social drinker, who drinks occasionally (every other week). She has previously "dabbled" with edibles, but found it difficult to find a good dose.    Comments:    Religion/Spirituality: Cassandra Ibarra described herself as leaning toward agnosticism. She suspects she may have religious trauma from childhood.    General Behavior: WNL Attire: WNL Gait: not observed-telehealth Motor Activity: WNL Stream of Thought - Productivity: WNL Stream of thought - Progression: WNL Stream of thought - Language:  WNL Emotional tone and reactions - Mood: WNL  Emotional tone and reactions - Affect: WNL Mental trend/Content of thoughts - Perception: WNL Mental trend/Content of thoughts - Orientation: WNL Mental trend/Content of thoughts - Memory: WNL Mental trend/Content of thoughts - General knowledge: WNL Insight: WNL Judgment: WNL Intelligence: WNL Mental Status Comment: WNL Diagnostic Summary Major Depression, Recurrent, Moderate (F33.2), Post Traumatic Stress disorder, chronic (F43.12)   Cassandra LITTIE Ponto, PhD               Cassandra LITTIE Ponto, PhD

## 2024-03-21 ENCOUNTER — Ambulatory Visit (INDEPENDENT_AMBULATORY_CARE_PROVIDER_SITE_OTHER): Admitting: Clinical

## 2024-03-21 DIAGNOSIS — F4312 Post-traumatic stress disorder, chronic: Secondary | ICD-10-CM | POA: Diagnosis not present

## 2024-03-21 DIAGNOSIS — F332 Major depressive disorder, recurrent severe without psychotic features: Secondary | ICD-10-CM

## 2024-03-21 NOTE — Progress Notes (Signed)
 Time: 12:04 pm -12:58 pm CPT Code: [id number]-04 Diagnosis: F33.2, F43.12  Cassandra Ibarra was seen remotely using secure video conferencing. She was in her home and therapist was in her at office time of the appointment. Client is aware of risks of telehealth and consented to a virtual visit. Session focused on issues that had arisen related to her mother expressing a plan for a man to move into her home and pay rent in exchange of caregiving of his health needs. Cassandra Ibarra expressed her concerns for her relationship with her mother. Therapist engaged her in consideration of communication strategies and healthy boundary setting. She is scheduled to be seen again in two week.     Treatment Plan Client Abilities/Strengths  Cassandra Ibarra described herself as open to listen to insights from others.  Client Treatment Preferences:  Cassandra Ibarra shared that she is ok with appointments during the daytime, but there is a chance she may need to change her appointments to better fit her work schedule. She prefers appointments on Mondays, Wednesdays, or Fridays.  Client Statement of Needs  Cassandra Ibarra shared that she is in need of a professional outlet for stress.  Treatment Level  She shared that she would probably benefit from biweekly sessions.   Symptoms  Problems Addressed  Cassandra Ibarra is going through a divorce that marks the end of a 10-year relationship, as well as the losses of several close family members. She described her childhood as "chaotic," with ongoing stress related to her father's health concerns.  Goals 1. Cassandra Ibarra will be provided with support and, when appropriate, strategies to cope with her current situation. Objective Cassandra Ibarra will develop coping strategies and increase overall motivation.  Target Date: 11/22/2024 Frequency: Biweekly  Progress: 75 Modality: Individual   Related Interventions Cassandra Ibarra will be provided with opportunities to process her experiences in session Therapist will help Cassandra Ibarra to notice and  disengage from maladaptive thoughts and behaviors using CBT-based strategies Cassandra Ibarra will have opportunities to process experiences from her childhood in order to better understand their current impact Therapist will provide referrals for additional resources as appropriate  Diagnosis Axis none Major Depression, Recurrent, Moderate (F33.2) PTSD, Chronic    Axis none    Conditions For Discharge Achievement of treatment goals and objectives   Intake  Presenting Problem   Cassandra Ibarra presented seeking individual therapy. She shared that she has had one of the hardest years of her life. She suffered COVID in September, followed by pneumonia, followed by bronchitis. She and her wife separated but were still living together at time of intake. Since 01-26-2026her  grandmother died, then her father passed away three weeks later. She had not been in contact with either for mental health purposes for 10 years. Her ex-wife suffered a motorcycle accident two weeks before the initial session and Cassandra Ibarra was caring for her during her recovery. Suicidal ideation and intent were denied. Symptoms  Fatigue/lack of energy, sleep disturbances including vivid dreams, she shared a general sense of having a low battery/emotional exhaustion, difficulty connecting with emotions and a sense of "pushing through" History of Problem   Cassandra Ibarra suffers from polycystic ovarian syndrome. End of 2017/2018 she was taking different hormone based birth controls. 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 These lasted roughly two weeks. She reported that she "woke up and cried the whole day" for two weeks. She attended therapy during this time for about two months. She also began taking  antidepressant medication. She also attended therapy in 2012 following a bad breakup. She first began taking antidepressants in 2018, but reported a  history of "taking and stopping them." She has taken Lexapro, buspar, trazodone, and has spent the most time taking citalopram . She reported some fear of taking new medication due to her experiences with an uncle who took Wellbutrin and had a terrible suicidal ideation episode that resulted in him going missing for three days. She shared that she has similar medication reactions to her mother. She was not taking any prescription medications at time of intake. She is prescribed zetbound and Vyvanse but has not been able to access Vyvanse due to the shortage and has not been actively taking either. Cassandra Ibarra revealed that she had caught her father stalking her home 6 months prior to his death. Recent Trigger     Marital and Family Information  She and her wife had been married for over 8 years, and together for 10 years prior to divorcing. She shared that she had a high school relationship that ended prior to college, several relationships in college, and met her wife and married her by 25.    Present family concerns/problems: Cassandra Ibarra is very close with her mother. She has a sister who is 34 years older with whom she has a good relationship. Her father died in 20-Oct-2023. She has extended family  she sees on holidays. She has not communicated with anyone on her father's side for ten years except for one uncle. She described her father as an addict since before she was born. He underwent a quadruple bypass when Cassandra Ibarra was 10, and became addicted to prescription painkillers following this. She described him as manipulative and narcisstic toward people he was close with. She reported frequent drama in family relationships having to do with her father's addictions, and aspects of her childhood as "chaotic." She shared that she had been "daddy's little girl" prior to maturing and becoming more aware of his challenges. She shared that she had only spoken with her father twice in the 10 years leading to his death. She  also reported writing him a long letter that he did not respond to, which was a hurtful experience for her. He reportedly died as a result of a heart attack three weeks after the death of Cassandra Ibarra's grandmother, who died following a fall.   Strengths/resources in the family/friends:  She shared that she participates in an active friend community online. She has met and planned trips with several people from the group. She is still close with two of her best friends from childhood. She also maintains a close relationship with her older sister and several work friends. However, she described herself as less social than pre-COVID. Since her divorce, Cassandra Ibarra has been involved in an online romantic relationship since late 2024.    Family background / ethnic factors: Cassandra Ibarra shared that her wife was a cisgendered female for the beginning of their relationship, but transitioned to female in 2019. She described this as very difficult for her, and that there were "no warning signs." She shared that prior to the transition, her wife had been very masculine, and described the experience as being "like my husband died." Looking back, she wonders if they should have ended the relationship at that time. Cassandra Ibarra shard that her wife has a history of hiding the truth not to hurt people, but eventually revealed that she was no longer attracted to women. She reported that this revelation had prompted her  to grieve her husband and earlier stages of the relationship, but not her wife.    No needs/concerns related to ethnicity reported when asked: No  Education/Vocation   Interpersonal concerns/problems: None. 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 Personal strengths: Cassandra Ibarra shared that she is continuing to function despite challenges, she actively looks for the positive. In the year since starting therapy, she has made significant strides toward establishing healthy  habits, including showering regularly, initiating a skincare routine, meal-prepping, cleaning her home, and losing 60 lbs. Military/work problems/concerns: None  Leisure Activities/Daily Functioning : no changes noted. Enjoys online social activities, including talking and gaming Legal Status  No Legal Problems: No Medical/Nutritional Concerns   Comments: polycystic ovarian syndrome, ADHD, fibromyalgia, questions whether she may be on the spectrum   Substance use/abuse/dependence: Cassandra Ibarra described herself as a social drinker, who drinks occasionally (every other week). She has previously "dabbled" with edibles, but found it difficult to find a good dose.    Comments:    Religion/Spirituality: Cassandra Ibarra described herself as leaning toward agnosticism. She suspects she may have religious trauma from childhood.    General Behavior: WNL Attire: WNL Gait: not observed-telehealth Motor Activity: WNL Stream of Thought - Productivity: WNL Stream of thought - Progression: WNL Stream of thought - Language:  WNL Emotional tone and reactions - Mood: WNL  Emotional tone and reactions - Affect: WNL Mental trend/Content of thoughts - Perception: WNL Mental trend/Content of thoughts - Orientation: WNL Mental trend/Content of thoughts - Memory: WNL Mental trend/Content of thoughts - General knowledge: WNL Insight: WNL Judgment: WNL Intelligence: WNL Mental Status Comment: WNL Diagnostic Summary Major Depression, Recurrent, Moderate (F33.2), Post Traumatic Stress disorder, chronic (F43.12)   Cassandra LITTIE Ponto, Cassandra Ibarra               Cassandra Ibarra, PhDTime: 8:00 am -8:58 am CPT Code: [id number]-04 Diagnosis: F33.2, F43.12  Cassandra Ibarra was seen remotely using secure video conferencing. She was in her home and therapist was in her at office time of the appointment. Client is aware of risks of telehealth and consented to a virtual visit. Cassandra Ibarra reported an improvement in sleep and overall mood since  her last session. She reflected upon having been able to maintain healthy habits while grieving her cat's death, as well as her significant progress over the last year. Session also included discussion of Cat's exploration of her identify and self-expression since her divorce.  She is scheduled to be seen again in one week.     Treatment Plan Client Abilities/Strengths  Cassandra Ibarra described herself as open to listen to insights from others.  Client Treatment Preferences:  Cassandra Ibarra shared that she is ok with appointments during the daytime, but there is a chance she may need to change her appointments to better fit her work schedule. She prefers appointments on Mondays, Wednesdays, or Fridays.  Client Statement of Needs  Dystany shared that she is in need of a professional outlet for stress.  Treatment Level  She shared that she would probably benefit from biweekly sessions.   Symptoms  Problems Addressed  Nimco is going through a divorce that marks the end of a 10-year relationship, as well as the losses of several close family members. She described her childhood as "chaotic," with ongoing stress related to her father's health concerns.  Goals 1. Rebekkah will be provided with support and, when appropriate, strategies to cope with her current situation. Objective Sorayah will  develop coping strategies and increase overall motivation.  Target Date: 11/22/2024 Frequency: Biweekly  Progress: 75 Modality: Individual   Related Interventions Kathline will be provided with opportunities to process her experiences in session Therapist will help Namine to notice and disengage from maladaptive thoughts and behaviors using CBT-based strategies Kealey will have opportunities to process experiences from her childhood in order to better understand their current impact Therapist will provide referrals for additional resources as appropriate  Diagnosis Axis none Major Depression, Recurrent, Moderate  (F33.2) PTSD, Chronic    Axis none    Conditions For Discharge Achievement of treatment goals and objectives   Intake  Presenting Problem   Rakia presented seeking individual therapy. She shared that she has had one of the hardest years of her life. She suffered COVID in September, followed by pneumonia, followed by bronchitis. She and her wife separated but were still living together at time of intake. Since 2026/01/03her  grandmother died, then her father passed away three weeks later. She had not been in contact with either for mental health purposes for 10 years. Her ex-wife suffered a motorcycle accident two weeks before the initial session and Sheala was caring for her during her recovery. Suicidal ideation and intent were denied. Symptoms  Fatigue/lack of energy, sleep disturbances including vivid dreams, she shared a general sense of having a low battery/emotional exhaustion, difficulty connecting with emotions and a sense of "pushing through" History of Problem   Jasemine suffers from polycystic ovarian syndrome. End of 2017/2018 she was taking different hormone based birth controls. 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 These lasted roughly two weeks. She reported that she "woke up and cried the whole day" for two weeks. She attended therapy during this time for about two months. She also began taking antidepressant medication. She also attended therapy in 2012 following a bad breakup. She first began taking antidepressants in 2018, but reported a history of "taking and stopping them." She has taken Lexapro, buspar, trazodone, and has spent the most time taking citalopram . She reported some fear of taking new medication due to her experiences with an uncle who took Wellbutrin and had a terrible suicidal ideation episode that resulted in him going missing for three days. She shared  that she has similar medication reactions to her mother. She was not taking any prescription medications at time of intake. She is prescribed zetbound and Vyvanse but has not been able to access Vyvanse due to the shortage and has not been actively taking either. Dulce revealed that she had caught her father stalking her home 6 months prior to his death. Recent Trigger     Marital and Family Information  She and her wife had been married for over 8 years, and together for 10 years prior to divorcing. She shared that she had a high school relationship that ended prior to college, several relationships in college, and met her wife and married her by 48.    Present family concerns/problems: Blessyn is very close with her mother. She has a sister who is 74 years older with whom she has a good relationship. Her father died in 2023-10-04. She has extended family  she sees on holidays. She has not communicated with anyone on her father's side for ten years except for one uncle. She described her father as an addict since before she was born. He underwent a quadruple bypass when Minka  was 10, and became addicted to prescription painkillers following this. She described him as manipulative and narcisstic toward people he was close with. She reported frequent drama in family relationships having to do with her father's addictions, and aspects of her childhood as "chaotic." She shared that she had been "daddy's little girl" prior to maturing and becoming more aware of his challenges. She shared that she had only spoken with her father twice in the 10 years leading to his death. She also reported writing him a long letter that he did not respond to, which was a hurtful experience for her. He reportedly died as a result of a heart attack three weeks after the death of Chidera's grandmother, who died following a fall.   Strengths/resources in the family/friends:  She shared that she participates in an active friend  community online. She has met and planned trips with several people from the group. She is still close with two of her best friends from childhood. She also maintains a close relationship with her older sister and several work friends. However, she described herself as less social than pre-COVID. Since her divorce, Cassandra Ibarra has been involved in an online romantic relationship since late 2024.    Family background / ethnic factors: Kanitra shared that her wife was a cisgendered female for the beginning of their relationship, but transitioned to female in 2019. She described this as very difficult for her, and that there were "no warning signs." She shared that prior to the transition, her wife had been very masculine, and described the experience as being "like my husband died." Looking back, she wonders if they should have ended the relationship at that time. Shanele shard that her wife has a history of hiding the truth not to hurt people, but eventually revealed that she was no longer attracted to women. She reported that this revelation had prompted her to grieve her husband and earlier stages of the relationship, but not her wife.    No needs/concerns related to ethnicity reported when asked: No  Education/Vocation   Interpersonal concerns/problems: None. 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 Personal strengths: Ayane shared that she is continuing to function despite challenges, she actively looks for the positive. In the year since starting therapy, she has made significant strides toward establishing healthy habits, including showering regularly, initiating a skincare routine, meal-prepping, cleaning her home, and losing 60 lbs. Military/work problems/concerns: None  Leisure Activities/Daily Functioning : no changes noted. Enjoys online social activities, including talking and gaming Legal Status  No Legal Problems: No Medical/Nutritional  Concerns   Comments: polycystic ovarian syndrome, ADHD, fibromyalgia, questions whether she may be on the spectrum   Substance use/abuse/dependence: Novella described herself as a social drinker, who drinks occasionally (every other week). She has previously "dabbled" with edibles, but found it difficult to find a good dose.    Comments:    Religion/Spirituality: Lateasha described herself as leaning toward agnosticism. She suspects she may have religious trauma from childhood.    General Behavior: WNL Attire: WNL Gait: not observed-telehealth Motor Activity: WNL Stream of Thought - Productivity: WNL Stream of thought - Progression: WNL Stream of thought - Language:  WNL Emotional tone and reactions - Mood: WNL  Emotional tone and reactions - Affect: WNL Mental trend/Content of thoughts - Perception: WNL Mental trend/Content of thoughts - Orientation: WNL Mental trend/Content of thoughts - Memory: WNL Mental trend/Content of thoughts - General knowledge: WNL  Insight: WNL Judgment: WNL Intelligence: WNL Mental Status Comment: WNL Diagnostic Summary Major Depression, Recurrent, Moderate (F33.2), Post Traumatic Stress disorder, chronic (F43.12)     Cassandra LITTIE Ponto, Cassandra Ibarra               Cassandra LITTIE Ponto, Cassandra Ibarra

## 2024-04-04 ENCOUNTER — Ambulatory Visit: Admitting: Clinical

## 2024-04-08 ENCOUNTER — Ambulatory Visit: Admitting: Clinical

## 2024-04-08 DIAGNOSIS — F331 Major depressive disorder, recurrent, moderate: Secondary | ICD-10-CM

## 2024-04-08 DIAGNOSIS — F4312 Post-traumatic stress disorder, chronic: Secondary | ICD-10-CM | POA: Diagnosis not present

## 2024-04-08 NOTE — Progress Notes (Signed)
 Time: 1:02 pm -1:57 pm CPT Code: [id number] Diagnosis: F33.1, F43.12  Cassandra Ibarra was seen remotely using secure video conferencing. She was in her home and therapist was in her at office time of the appointment. Client is aware of risks of telehealth and consented to a virtual visit. Cassandra Ibarra reported upon several major steps since her last session. She had adopted two kittens, and has maintained her healthy habits despite several stressful events this summer. She reflected upon stress related to her relationship, and therapist suggested communication strategies. She is scheduled to be seen again in two weeks.     Treatment Plan Client Abilities/Strengths  Cassandra Ibarra described herself as open to listen to insights from others.  Client Treatment Preferences:  Cassandra Ibarra shared that she is ok with appointments during the daytime, but there is a chance she may need to change her appointments to better fit her work schedule. She prefers appointments on Mondays, Wednesdays, or Fridays.  Client Statement of Needs  Cassandra Ibarra shared that she is in need of a professional outlet for stress.  Treatment Level  She shared that she would probably benefit from biweekly sessions.   Symptoms  Problems Addressed  Cassandra Ibarra is going through a divorce that marks the end of a 10-year relationship, as well as the losses of several close family members. She described her childhood as "chaotic," with ongoing stress related to her father's health concerns.  Goals 1. Cassandra Ibarra will be provided with support and, when appropriate, strategies to cope with her current situation. Objective Cassandra Ibarra will develop coping strategies and increase overall motivation.  Target Date: 11/22/2024 Frequency: Biweekly  Progress: 75 Modality: Individual   Related Interventions Cassandra Ibarra will be provided with opportunities to process her experiences in session Therapist will help Cassandra Ibarra to notice and disengage from maladaptive thoughts and behaviors using  CBT-based strategies Cassandra Ibarra will have opportunities to process experiences from her childhood in order to better understand their current impact Therapist will provide referrals for additional resources as appropriate  Diagnosis Axis none Major Depression, Recurrent, Moderate (F33.2) PTSD, Chronic    Axis none    Conditions For Discharge Achievement of treatment goals and objectives   Intake  Presenting Problem   Cassandra Ibarra presented seeking individual therapy. She shared that she has had one of the hardest years of her life. She suffered COVID in September, followed by pneumonia, followed by bronchitis. She and her wife separated but were still living together at time of intake. Since Jan 24, 2026her  grandmother died, then her father passed away three weeks later. She had not been in contact with either for mental health purposes for 10 years. Her ex-wife suffered a motorcycle accident two weeks before the initial session and Cassandra Ibarra was caring for her during her recovery. Suicidal ideation and intent were denied. Symptoms  Fatigue/lack of energy, sleep disturbances including vivid dreams, she shared a general sense of having a low battery/emotional exhaustion, difficulty connecting with emotions and a sense of "pushing through" History of Problem   Cassandra Ibarra suffers from polycystic ovarian syndrome. End of 2017/2018 she was taking different hormone based birth controls. 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 These lasted roughly two weeks. She reported that she "woke up and cried the whole day" for two weeks. She attended therapy during this time for about two months. She also began taking antidepressant medication. She also attended therapy in 2012 following a bad breakup. She first began  taking antidepressants in 2018, but reported a history of "taking and stopping them." She has taken  Lexapro, buspar, trazodone, and has spent the most time taking citalopram . She reported some fear of taking new medication due to her experiences with an uncle who took Wellbutrin and had a terrible suicidal ideation episode that resulted in him going missing for three days. She shared that she has similar medication reactions to her mother. She was not taking any prescription medications at time of intake. She is prescribed zetbound and Vyvanse but has not been able to access Vyvanse due to the shortage and has not been actively taking either. Cassandra Ibarra revealed that she had caught her father stalking her home 6 months prior to his death. Recent Trigger     Marital and Family Information  She and her wife had been married for over 8 years, and together for 10 years prior to divorcing. She shared that she had a high school relationship that ended prior to college, several relationships in college, and met her wife and married her by 45.    Present family concerns/problems: Cassandra Ibarra is very close with her mother. She has a sister who is 57 years older with whom she has a good relationship. Her father died in 09-24-2023. She has extended family  she sees on holidays. She has not communicated with anyone on her father's side for ten years except for one uncle. She described her father as an addict since before she was born. He underwent a quadruple bypass when Cassandra Ibarra was 10, and became addicted to prescription painkillers following this. She described him as manipulative and narcisstic toward people he was close with. She reported frequent drama in family relationships having to do with her father's addictions, and aspects of her childhood as "chaotic." She shared that she had been "daddy's little girl" prior to maturing and becoming more aware of his challenges. She shared that she had only spoken with her father twice in the 10 years leading to his death. She also reported writing him a long letter that he did not  respond to, which was a hurtful experience for her. He reportedly died as a result of a heart attack three weeks after the death of Cassandra Ibarra's grandmother, who died following a fall.   Strengths/resources in the family/friends:  She shared that she participates in an active friend community online. She has met and planned trips with several people from the group. She is still close with two of her best friends from childhood. She also maintains a close relationship with her older sister and several work friends. However, she described herself as less social than pre-COVID. Since her divorce, Cassandra Ibarra has been involved in an online romantic relationship since late 2024.    Family background / ethnic factors: Cassandra Ibarra shared that her wife was a cisgendered female for the beginning of their relationship, but transitioned to female in 2019. She described this as very difficult for her, and that there were "no warning signs." She shared that prior to the transition, her wife had been very masculine, and described the experience as being "like my husband died." Looking back, she wonders if they should have ended the relationship at that time. Merna shard that her wife has a history of hiding the truth not to hurt people, but eventually revealed that she was no longer attracted to women. She reported that this revelation had prompted her to grieve her husband and earlier stages of the relationship, but not her wife.  No needs/concerns related to ethnicity reported when asked: No  Education/Vocation   Interpersonal concerns/problems: None. 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 Personal strengths: Cassandra Ibarra shared that she is continuing to function despite challenges, she actively looks for the positive. In the year since starting therapy, she has made significant strides toward establishing healthy habits, including showering regularly, initiating a  skincare routine, meal-prepping, cleaning her home, and losing 60 lbs. Military/work problems/concerns: None  Leisure Activities/Daily Functioning : no changes noted. Enjoys online social activities, including talking and gaming Legal Status  No Legal Problems: No Medical/Nutritional Concerns   Comments: polycystic ovarian syndrome, ADHD, fibromyalgia, questions whether she may be on the spectrum   Substance use/abuse/dependence: Cassandra Ibarra described herself as a social drinker, who drinks occasionally (every other week). She has previously "dabbled" with edibles, but found it difficult to find a good dose.    Comments:    Religion/Spirituality: Cassandra Ibarra described herself as leaning toward agnosticism. She suspects she may have religious trauma from childhood.    General Behavior: WNL Attire: WNL Gait: not observed-telehealth Motor Activity: WNL Stream of Thought - Productivity: WNL Stream of thought - Progression: WNL Stream of thought - Language:  WNL Emotional tone and reactions - Mood: WNL  Emotional tone and reactions - Affect: WNL Mental trend/Content of thoughts - Perception: WNL Mental trend/Content of thoughts - Orientation: WNL Mental trend/Content of thoughts - Memory: WNL Mental trend/Content of thoughts - General knowledge: WNL Insight: WNL Judgment: WNL Intelligence: WNL Mental Status Comment: WNL Diagnostic Summary Major Depression, Recurrent, Moderate (F33.2), Post Traumatic Stress disorder, chronic (F43.12)   Cassandra LITTIE Ponto, Cassandra Ibarra               Cassandra Ibarra, PhDTime: 8:00 am -8:58 am CPT Code: [id number]-04 Diagnosis: F33.2, F43.12  Cassandra Ibarra was seen remotely using secure video conferencing. She was in her home and therapist was in her at office time of the appointment. Client is aware of risks of telehealth and consented to a virtual visit. Cassandra Ibarra reported an improvement in sleep and overall mood since her last session. She reflected upon having been  able to maintain healthy habits while grieving her cat's death, as well as her significant progress over the last year. Session also included discussion of Cat's exploration of her identify and self-expression since her divorce.  She is scheduled to be seen again in one week.     Treatment Plan Client Abilities/Strengths  Teryn described herself as open to listen to insights from others.  Client Treatment Preferences:  Karra shared that she is ok with appointments during the daytime, but there is a chance she may need to change her appointments to better fit her work schedule. She prefers appointments on Mondays, Wednesdays, or Fridays.  Client Statement of Needs  Stephana shared that she is in need of a professional outlet for stress.  Treatment Level  She shared that she would probably benefit from biweekly sessions.   Symptoms  Problems Addressed  Lashaundra is going through a divorce that marks the end of a 10-year relationship, as well as the losses of several close family members. She described her childhood as "chaotic," with ongoing stress related to her father's health concerns.  Goals 1. Ladasha will be provided with support and, when appropriate, strategies to cope with her current situation. Objective Cassandra Ibarra will develop coping strategies and increase overall motivation.  Target Date: 11/22/2024 Frequency: Biweekly  Progress: 75 Modality:  Individual   Related Interventions Cassandra Ibarra will be provided with opportunities to process her experiences in session Therapist will help Cassandra Ibarra to notice and disengage from maladaptive thoughts and behaviors using CBT-based strategies Cassandra Ibarra will have opportunities to process experiences from her childhood in order to better understand their current impact Therapist will provide referrals for additional resources as appropriate  Diagnosis Axis none Major Depression, Recurrent, Moderate (F33.2) PTSD, Chronic    Axis none    Conditions  For Discharge Achievement of treatment goals and objectives   Intake  Presenting Problem   Brookelyn presented seeking individual therapy. She shared that she has had one of the hardest years of her life. She suffered COVID in September, followed by pneumonia, followed by bronchitis. She and her wife separated but were still living together at time of intake. Since 01-18-26her  grandmother died, then her father passed away three weeks later. She had not been in contact with either for mental health purposes for 10 years. Her ex-wife suffered a motorcycle accident two weeks before the initial session and Briahna was caring for her during her recovery. Suicidal ideation and intent were denied. Symptoms  Fatigue/lack of energy, sleep disturbances including vivid dreams, she shared a general sense of having a low battery/emotional exhaustion, difficulty connecting with emotions and a sense of "pushing through" History of Problem   Cambri suffers from polycystic ovarian syndrome. End of 2017/2018 she was taking different hormone based birth controls. 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 These lasted roughly two weeks. She reported that she "woke up and cried the whole day" for two weeks. She attended therapy during this time for about two months. She also began taking antidepressant medication. She also attended therapy in 2012 following a bad breakup. She first began taking antidepressants in 2018, but reported a history of "taking and stopping them." She has taken Lexapro, buspar, trazodone, and has spent the most time taking citalopram . She reported some fear of taking new medication due to her experiences with an uncle who took Wellbutrin and had a terrible suicidal ideation episode that resulted in him going missing for three days. She shared that she has similar medication reactions to her  mother. She was not taking any prescription medications at time of intake. She is prescribed zetbound and Vyvanse but has not been able to access Vyvanse due to the shortage and has not been actively taking either. Latarra revealed that she had caught her father stalking her home 6 months prior to his death. Recent Trigger     Marital and Family Information  She and her wife had been married for over 8 years, and together for 10 years prior to divorcing. She shared that she had a high school relationship that ended prior to college, several relationships in college, and met her wife and married her by 34.    Present family concerns/problems: Letisha is very close with her mother. She has a sister who is 52 years older with whom she has a good relationship. Her father died in 2023/09/29. She has extended family  she sees on holidays. She has not communicated with anyone on her father's side for ten years except for one uncle. She described her father as an addict since before she was born. He underwent a quadruple bypass when Addie was 10, and became addicted to prescription painkillers following this. She described him as manipulative and narcisstic  toward people he was close with. She reported frequent drama in family relationships having to do with her father's addictions, and aspects of her childhood as "chaotic." She shared that she had been "daddy's little girl" prior to maturing and becoming more aware of his challenges. She shared that she had only spoken with her father twice in the 10 years leading to his death. She also reported writing him a long letter that he did not respond to, which was a hurtful experience for her. He reportedly died as a result of a heart attack three weeks after the death of Mafalda's grandmother, who died following a fall.   Strengths/resources in the family/friends:  She shared that she participates in an active friend community online. She has met and planned trips with  several people from the group. She is still close with two of her best friends from childhood. She also maintains a close relationship with her older sister and several work friends. However, she described herself as less social than pre-COVID. Since her divorce, Cassandra Ibarra has been involved in an online romantic relationship since late 2024.    Family background / ethnic factors: Resha shared that her wife was a cisgendered female for the beginning of their relationship, but transitioned to female in 2019. She described this as very difficult for her, and that there were "no warning signs." She shared that prior to the transition, her wife had been very masculine, and described the experience as being "like my husband died." Looking back, she wonders if they should have ended the relationship at that time. Janazia shard that her wife has a history of hiding the truth not to hurt people, but eventually revealed that she was no longer attracted to women. She reported that this revelation had prompted her to grieve her husband and earlier stages of the relationship, but not her wife.    No needs/concerns related to ethnicity reported when asked: No  Education/Vocation   Interpersonal concerns/problems: None. 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 Personal strengths: Gema shared that she is continuing to function despite challenges, she actively looks for the positive. In the year since starting therapy, she has made significant strides toward establishing healthy habits, including showering regularly, initiating a skincare routine, meal-prepping, cleaning her home, and losing 60 lbs. Military/work problems/concerns: None  Leisure Activities/Daily Functioning : no changes noted. Enjoys online social activities, including talking and gaming Legal Status  No Legal Problems: No Medical/Nutritional Concerns   Comments: polycystic ovarian syndrome,  ADHD, fibromyalgia, questions whether she may be on the spectrum   Substance use/abuse/dependence: Dayja described herself as a social drinker, who drinks occasionally (every other week). She has previously "dabbled" with edibles, but found it difficult to find a good dose.    Comments:    Religion/Spirituality: Cassandra Ibarra described herself as leaning toward agnosticism. She suspects she may have religious trauma from childhood.    General Behavior: WNL Attire: WNL Gait: not observed-telehealth Motor Activity: WNL Stream of Thought - Productivity: WNL Stream of thought - Progression: WNL Stream of thought - Language:  WNL Emotional tone and reactions - Mood: WNL  Emotional tone and reactions - Affect: WNL Mental trend/Content of thoughts - Perception: WNL Mental trend/Content of thoughts - Orientation: WNL Mental trend/Content of thoughts - Memory: WNL Mental trend/Content of thoughts - General knowledge: WNL Insight: WNL Judgment: WNL Intelligence: WNL Mental Status Comment: WNL Diagnostic Summary Major Depression, Recurrent, Moderate (F33.2),  Post Traumatic Stress disorder, chronic (F43.12)     Cassandra LITTIE Ponto, Cassandra Ibarra               Cassandra LITTIE Ponto, Cassandra Ibarra

## 2024-04-21 ENCOUNTER — Ambulatory Visit: Admitting: Clinical

## 2024-04-28 ENCOUNTER — Ambulatory Visit (INDEPENDENT_AMBULATORY_CARE_PROVIDER_SITE_OTHER): Admitting: Clinical

## 2024-04-28 DIAGNOSIS — F331 Major depressive disorder, recurrent, moderate: Secondary | ICD-10-CM

## 2024-04-28 DIAGNOSIS — F4312 Post-traumatic stress disorder, chronic: Secondary | ICD-10-CM

## 2024-04-28 NOTE — Progress Notes (Signed)
 Time: 11:02 am -11:32 am CPT Code: [id number] Diagnosis: F33.1, F43.12  Cassandra Ibarra was seen remotely using secure video conferencing. She was in her home and therapist was in her at office time of the appointment. Client is aware of risks of telehealth and consented to a virtual visit. 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 She reflected upon changes in interpersonal dynamics and expectations of her role at work, as well as stressors that have arisen in her relationship. She reported that she continues to practice self-care, and this has been a positive. Therapist offered validation and support. She is scheduled to be seen again in two weeks.     Treatment Plan Client Abilities/Strengths  Cassandra Ibarra described herself as open to listen to insights from others.  Client Treatment Preferences:  Cassandra Ibarra shared that she is ok with appointments during the daytime, but there is a chance she may need to change her appointments to better fit her work schedule. She prefers appointments on Mondays, Wednesdays, or Fridays.  Client Statement of Needs  Cassandra Ibarra shared that she is in need of a professional outlet for stress.  Treatment Level  She shared that she would probably benefit from biweekly sessions.   Symptoms  Problems Addressed  Cassandra Ibarra is going through a divorce that marks the end of a 10-year relationship, as well as the losses of several close family members. She described her childhood as "chaotic," with ongoing stress related to her father's health concerns.  Goals 1. Cassandra Ibarra will be provided with support and, when appropriate, strategies to cope with her current situation. Objective Cassandra Ibarra will develop coping strategies and increase overall motivation.  Target Date: 11/22/2024 Frequency: Biweekly  Progress: 75 Modality: Individual   Related Interventions Cassandra Ibarra will be provided with opportunities to process her experiences in session Therapist will help  Cassandra Ibarra to notice and disengage from maladaptive thoughts and behaviors using CBT-based strategies Cassandra Ibarra will have opportunities to process experiences from her childhood in order to better understand their current impact Therapist will provide referrals for additional resources as appropriate  Diagnosis Axis none Major Depression, Recurrent, Moderate (F33.2) PTSD, Chronic    Axis none    Conditions For Discharge Achievement of treatment goals and objectives   Intake  Presenting Problem   Cassandra Ibarra presented seeking individual therapy. She shared that she has had one of the hardest years of her life. She suffered COVID in September, followed by pneumonia, followed by bronchitis. She and her wife separated but were still living together at time of intake. Since 01-30-26her  grandmother died, then her father passed away three weeks later. She had not been in contact with either for mental health purposes for 10 years. Her ex-wife suffered a motorcycle accident two weeks before the initial session and Cassandra Ibarra was caring for her during her recovery. Suicidal ideation and intent were denied. Symptoms  Fatigue/lack of energy, sleep disturbances including vivid dreams, she shared a general sense of having a low battery/emotional exhaustion, difficulty connecting with emotions and a sense of "pushing through" History of Problem   Cassandra Ibarra suffers from polycystic ovarian syndrome. End of 2017/2018 she was taking different hormone based birth controls. 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 These lasted roughly two weeks. She reported that she "woke up and cried the whole day" for two weeks. She attended therapy during this time for about two months. She also began  taking antidepressant medication. She also attended therapy in 2012 following a bad breakup. She first began taking antidepressants in  2018, but reported a history of "taking and stopping them." She has taken Lexapro, buspar, trazodone, and has spent the most time taking citalopram . She reported some fear of taking new medication due to her experiences with an uncle who took Wellbutrin and had a terrible suicidal ideation episode that resulted in him going missing for three days. She shared that she has similar medication reactions to her mother. She was not taking any prescription medications at time of intake. She is prescribed zetbound and Vyvanse but has not been able to access Vyvanse due to the shortage and has not been actively taking either. Cassandra Ibarra revealed that she had caught her father stalking her home 6 months prior to his death. Recent Trigger     Marital and Family Information  She and her wife had been married for over 8 years, and together for 10 years prior to divorcing. She shared that she had a high school relationship that ended prior to college, several relationships in college, and met her wife and married her by 44.    Present family concerns/problems: Cassandra Ibarra is very close with her mother. She has a sister who is 9 years older with whom she has a good relationship. Her father died in Sep 05, 2023. She has extended family  she sees on holidays. She has not communicated with anyone on her father's side for ten years except for one uncle. She described her father as an addict since before she was born. He underwent a quadruple bypass when Cassandra Ibarra was 10, and became addicted to prescription painkillers following this. She described him as manipulative and narcisstic toward people he was close with. She reported frequent drama in family relationships having to do with her father's addictions, and aspects of her childhood as "chaotic." She shared that she had been "daddy's little girl" prior to maturing and becoming more aware of his challenges. She shared that she had only spoken with her father twice in the 10 years leading  to his death. She also reported writing him a long letter that he did not respond to, which was a hurtful experience for her. He reportedly died as a result of a heart attack three weeks after the death of Rever's grandmother, who died following a fall.   Strengths/resources in the family/friends:  She shared that she participates in an active friend community online. She has met and planned trips with several people from the group. She is still close with two of her best friends from childhood. She also maintains a close relationship with her older sister and several work friends. However, she described herself as less social than pre-COVID. Since her divorce, Cassandra Ibarra has been involved in an online romantic relationship since late 2024.    Family background / ethnic factors: Aleighya shared that her wife was a cisgendered female for the beginning of their relationship, but transitioned to female in 2019. She described this as very difficult for her, and that there were "no warning signs." She shared that prior to the transition, her wife had been very masculine, and described the experience as being "like my husband died." Looking back, she wonders if they should have ended the relationship at that time. Jalaine shard that her wife has a history of hiding the truth not to hurt people, but eventually revealed that she was no longer attracted to women. She reported that this revelation had prompted  her to grieve her husband and earlier stages of the relationship, but not her wife.    No needs/concerns related to ethnicity reported when asked: No  Education/Vocation   Interpersonal concerns/problems: None. 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 Personal strengths: Allena shared that she is continuing to function despite challenges, she actively looks for the positive. In the year since starting therapy, she has made significant strides toward  establishing healthy habits, including showering regularly, initiating a skincare routine, meal-prepping, cleaning her home, and losing 60 lbs. Military/work problems/concerns: None  Leisure Activities/Daily Functioning : no changes noted. Enjoys online social activities, including talking and gaming Legal Status  No Legal Problems: No Medical/Nutritional Concerns   Comments: polycystic ovarian syndrome, ADHD, fibromyalgia, questions whether she may be on the spectrum   Substance use/abuse/dependence: Angalina described herself as a social drinker, who drinks occasionally (every other week). She has previously "dabbled" with edibles, but found it difficult to find a good dose.    Comments:    Religion/Spirituality: Philisha described herself as leaning toward agnosticism. She suspects she may have religious trauma from childhood.    General Behavior: WNL Attire: WNL Gait: not observed-telehealth Motor Activity: WNL Stream of Thought - Productivity: WNL Stream of thought - Progression: WNL Stream of thought - Language:  WNL Emotional tone and reactions - Mood: WNL  Emotional tone and reactions - Affect: WNL Mental trend/Content of thoughts - Perception: WNL Mental trend/Content of thoughts - Orientation: WNL Mental trend/Content of thoughts - Memory: WNL Mental trend/Content of thoughts - General knowledge: WNL Insight: WNL Judgment: WNL Intelligence: WNL Mental Status Comment: WNL Diagnostic Summary Major Depression, Recurrent, Moderate (F33.2), Post Traumatic Stress disorder, chronic (F43.12)   Andriette LITTIE Ponto, PhD               Andriette LITTIE Ponto, PhDTime: 8:00 am -8:58 am CPT Code: [id number]-04 Diagnosis: F33.2, F43.12  Cassandra Ibarra was seen remotely using secure video conferencing. She was in her home and therapist was in her at office time of the appointment. Client is aware of risks of telehealth and consented to a virtual visit. Cassandra Ibarra reported an improvement in sleep  and overall mood since her last session. She reflected upon having been able to maintain healthy habits while grieving her cat's death, as well as her significant progress over the last year. Session also included discussion of Cat's exploration of her identify and self-expression since her divorce.  She is scheduled to be seen again in one week.     Treatment Plan Client Abilities/Strengths  Cassandra Ibarra described herself as open to listen to insights from others.  Client Treatment Preferences:  Cassandra Ibarra shared that she is ok with appointments during the daytime, but there is a chance she may need to change her appointments to better fit her work schedule. She prefers appointments on Mondays, Wednesdays, or Fridays.  Client Statement of Needs  Cassandra Ibarra shared that she is in need of a professional outlet for stress.  Treatment Level  She shared that she would probably benefit from biweekly sessions.   Symptoms  Problems Addressed  Cassandra Ibarra is going through a divorce that marks the end of a 10-year relationship, as well as the losses of several close family members. She described her childhood as "chaotic," with ongoing stress related to her father's health concerns.  Goals 1. Cassandra Ibarra will be provided with support and, when appropriate, strategies to cope with her current situation. Objective Cassandra Ibarra  will develop coping strategies and increase overall motivation.  Target Date: 11/22/2024 Frequency: Biweekly  Progress: 75 Modality: Individual   Related Interventions Cassandra Ibarra will be provided with opportunities to process her experiences in session Therapist will help Cassandra Ibarra to notice and disengage from maladaptive thoughts and behaviors using CBT-based strategies Cassandra Ibarra will have opportunities to process experiences from her childhood in order to better understand their current impact Therapist will provide referrals for additional resources as appropriate  Diagnosis Axis none Major Depression,  Recurrent, Moderate (F33.2) PTSD, Chronic    Axis none    Conditions For Discharge Achievement of treatment goals and objectives   Intake  Presenting Problem   Lamiracle presented seeking individual therapy. She shared that she has had one of the hardest years of her life. She suffered COVID in September, followed by pneumonia, followed by bronchitis. She and her wife separated but were still living together at time of intake. Since 01/30/2026her  grandmother died, then her father passed away three weeks later. She had not been in contact with either for mental health purposes for 10 years. Her ex-wife suffered a motorcycle accident two weeks before the initial session and Timothy was caring for her during her recovery. Suicidal ideation and intent were denied. Symptoms  Fatigue/lack of energy, sleep disturbances including vivid dreams, she shared a general sense of having a low battery/emotional exhaustion, difficulty connecting with emotions and a sense of "pushing through" History of Problem   Yulieth suffers from polycystic ovarian syndrome. End of 2017/2018 she was taking different hormone based birth controls. 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 These lasted roughly two weeks. She reported that she "woke up and cried the whole day" for two weeks. She attended therapy during this time for about two months. She also began taking antidepressant medication. She also attended therapy in 2012 following a bad breakup. She first began taking antidepressants in 2018, but reported a history of "taking and stopping them." She has taken Lexapro, buspar, trazodone, and has spent the most time taking citalopram . She reported some fear of taking new medication due to her experiences with an uncle who took Wellbutrin and had a terrible suicidal ideation episode that resulted in him going missing for  three days. She shared that she has similar medication reactions to her mother. She was not taking any prescription medications at time of intake. She is prescribed zetbound and Vyvanse but has not been able to access Vyvanse due to the shortage and has not been actively taking either. Keiondra revealed that she had caught her father stalking her home 6 months prior to his death. Recent Trigger     Marital and Family Information  She and her wife had been married for over 8 years, and together for 10 years prior to divorcing. She shared that she had a high school relationship that ended prior to college, several relationships in college, and met her wife and married her by 65.    Present family concerns/problems: Raha is very close with her mother. She has a sister who is 45 years older with whom she has a good relationship. Her father died in October 02, 2023. She has extended family  she sees on holidays. She has not communicated with anyone on her father's side for ten years except for one uncle. She described her father as an addict since before she was born. He underwent a quadruple bypass when  Darnisha was 10, and became addicted to prescription painkillers following this. She described him as manipulative and narcisstic toward people he was close with. She reported frequent drama in family relationships having to do with her father's addictions, and aspects of her childhood as "chaotic." She shared that she had been "daddy's little girl" prior to maturing and becoming more aware of his challenges. She shared that she had only spoken with her father twice in the 10 years leading to his death. She also reported writing him a long letter that he did not respond to, which was a hurtful experience for her. He reportedly died as a result of a heart attack three weeks after the death of Candy's grandmother, who died following a fall.   Strengths/resources in the family/friends:  She shared that she participates  in an active friend community online. She has met and planned trips with several people from the group. She is still close with two of her best friends from childhood. She also maintains a close relationship with her older sister and several work friends. However, she described herself as less social than pre-COVID. Since her divorce, Cassandra Ibarra has been involved in an online romantic relationship since late 2024.    Family background / ethnic factors: Estell shared that her wife was a cisgendered female for the beginning of their relationship, but transitioned to female in 2019. She described this as very difficult for her, and that there were "no warning signs." She shared that prior to the transition, her wife had been very masculine, and described the experience as being "like my husband died." Looking back, she wonders if they should have ended the relationship at that time. Vaunda shard that her wife has a history of hiding the truth not to hurt people, but eventually revealed that she was no longer attracted to women. She reported that this revelation had prompted her to grieve her husband and earlier stages of the relationship, but not her wife.    No needs/concerns related to ethnicity reported when asked: No  Education/Vocation   Interpersonal concerns/problems: None. 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 Personal strengths: Jacqlyn shared that she is continuing to function despite challenges, she actively looks for the positive. In the year since starting therapy, she has made significant strides toward establishing healthy habits, including showering regularly, initiating a skincare routine, meal-prepping, cleaning her home, and losing 60 lbs. Military/work problems/concerns: None  Leisure Activities/Daily Functioning : no changes noted. Enjoys online social activities, including talking and gaming Legal Status  No Legal Problems:  No Medical/Nutritional Concerns   Comments: polycystic ovarian syndrome, ADHD, fibromyalgia, questions whether she may be on the spectrum   Substance use/abuse/dependence: Naylee described herself as a social drinker, who drinks occasionally (every other week). She has previously "dabbled" with edibles, but found it difficult to find a good dose.    Comments:    Religion/Spirituality: Jovani described herself as leaning toward agnosticism. She suspects she may have religious trauma from childhood.    General Behavior: WNL Attire: WNL Gait: not observed-telehealth Motor Activity: WNL Stream of Thought - Productivity: WNL Stream of thought - Progression: WNL Stream of thought - Language:  WNL Emotional tone and reactions - Mood: WNL  Emotional tone and reactions - Affect: WNL Mental trend/Content of thoughts - Perception: WNL Mental trend/Content of thoughts - Orientation: WNL Mental trend/Content of thoughts - Memory: WNL Mental trend/Content of thoughts - General knowledge:  WNL Insight: WNL Judgment: WNL Intelligence: WNL Mental Status Comment: WNL Diagnostic Summary Major Depression, Recurrent, Moderate (F33.2), Post Traumatic Stress disorder, chronic (F43.12)     Andriette LITTIE Ponto, PhD               Andriette LITTIE Ponto, PhD

## 2024-05-13 ENCOUNTER — Ambulatory Visit (INDEPENDENT_AMBULATORY_CARE_PROVIDER_SITE_OTHER): Admitting: Clinical

## 2024-05-13 DIAGNOSIS — F4312 Post-traumatic stress disorder, chronic: Secondary | ICD-10-CM

## 2024-05-13 DIAGNOSIS — F331 Major depressive disorder, recurrent, moderate: Secondary | ICD-10-CM

## 2024-05-13 NOTE — Progress Notes (Signed)
 Time: 10:07 am -11:30 am CPT Code: [id number] Diagnosis: F33.1, F43.12  Cassandra Ibarra was seen remotely using secure video conferencing. She was in her home and therapist was in her at office time of the appointment. Client is aware of risks of telehealth and consented to a virtual visit. Session focused on developments in her relationship. Therapist offered an opportunity to process, and encouraged Cassandra Ibarra to consider her personal limits in terms of what she is willing to endure.  She is scheduled to be seen again in two weeks, and therapist encouraged her to reach out if she needs to be seen sooner.    Treatment Plan Client Abilities/Strengths  Cassandra Ibarra described herself as open to listen to insights from others.  Client Treatment Preferences:  Cassandra Ibarra shared that she is ok with appointments during the daytime, but there is a chance she may need to change her appointments to better fit her work schedule. She prefers appointments on Mondays, Wednesdays, or Fridays.  Client Statement of Needs  Cassandra Ibarra shared that she is in need of a professional outlet for stress.  Treatment Level  She shared that she would probably benefit from biweekly sessions.   Symptoms  Problems Addressed  Cassandra Ibarra is going through a divorce that marks the end of a 10-year relationship, as well as the losses of several close family members. She described her childhood as "chaotic," with ongoing stress related to her father's health concerns.  Goals 1. Cassandra Ibarra will be provided with support and, when appropriate, strategies to cope with her current situation. Objective Kadience will develop coping strategies and increase overall motivation.  Target Date: 11/22/2024 Frequency: Biweekly  Progress: 75 Modality: Individual   Related Interventions Cassandra Ibarra will be provided with opportunities to process her experiences in session Therapist will help Cassandra Ibarra to notice and disengage from maladaptive thoughts and behaviors using CBT-based  strategies Cassandra Ibarra will have opportunities to process experiences from her childhood in order to better understand their current impact Therapist will provide referrals for additional resources as appropriate  Diagnosis Axis none Major Depression, Recurrent, Moderate (F33.2) PTSD, Chronic    Axis none    Conditions For Discharge Achievement of treatment goals and objectives   Intake  Presenting Problem   Cassandra Ibarra presented seeking individual therapy. She shared that she has had one of the hardest years of her life. She suffered COVID in September, followed by pneumonia, followed by bronchitis. She and her wife separated but were still living together at time of intake. Since 01/28/2026her  grandmother died, then her father passed away three weeks later. She had not been in contact with either for mental health purposes for 10 years. Her ex-wife suffered a motorcycle accident two weeks before the initial session and Cassandra Ibarra was caring for her during her recovery. Suicidal ideation and intent were denied. Symptoms  Fatigue/lack of energy, sleep disturbances including vivid dreams, she shared a general sense of having a low battery/emotional exhaustion, difficulty connecting with emotions and a sense of "pushing through" History of Problem   Cassandra Ibarra suffers from polycystic ovarian syndrome. End of 2017/2018 she was taking different hormone based birth controls. 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 These lasted roughly two weeks. She reported that she "woke up and cried the whole day" for two weeks. She attended therapy during this time for about two months. She also began taking antidepressant medication. She also attended therapy in 2012 following a bad  breakup. She first began taking antidepressants in 2018, but reported a history of "taking and stopping them." She has taken Lexapro, buspar,  trazodone, and has spent the most time taking citalopram . She reported some fear of taking new medication due to her experiences with an uncle who took Wellbutrin and had a terrible suicidal ideation episode that resulted in him going missing for three days. She shared that she has similar medication reactions to her mother. She was not taking any prescription medications at time of intake. She is prescribed zetbound and Vyvanse but has not been able to access Vyvanse due to the shortage and has not been actively taking either. Cassandra Ibarra revealed that she had caught her father stalking her home 6 months prior to his death. Recent Trigger     Marital and Family Information  She and her wife had been married for over 8 years, and together for 10 years prior to divorcing. She shared that she had a high school relationship that ended prior to college, several relationships in college, and met her wife and married her by 20.    Present family concerns/problems: Cassandra Ibarra is very close with her mother. She has a sister who is 92 years older with whom she has a good relationship. Her father died in 2023/09/27. She has extended family  she sees on holidays. She has not communicated with anyone on her father's side for ten years except for one uncle. She described her father as an addict since before she was born. He underwent a quadruple bypass when Cassandra Ibarra was 10, and became addicted to prescription painkillers following this. She described him as manipulative and narcisstic toward people he was close with. She reported frequent drama in family relationships having to do with her father's addictions, and aspects of her childhood as "chaotic." She shared that she had been "daddy's little girl" prior to maturing and becoming more aware of his challenges. She shared that she had only spoken with her father twice in the 10 years leading to his death. She also reported writing him a long letter that he did not respond to, which  was a hurtful experience for her. He reportedly died as a result of a heart attack three weeks after the death of Cassandra Ibarra's grandmother, who died following a fall.   Strengths/resources in the family/friends:  She shared that she participates in an active friend community online. She has met and planned trips with several people from the group. She is still close with two of her best friends from childhood. She also maintains a close relationship with her older sister and several work friends. However, she described herself as less social than pre-COVID. Since her divorce, Cassandra Ibarra has been involved in an online romantic relationship since late 2024.    Family background / ethnic factors: Cassandra Ibarra shared that her wife was a cisgendered female for the beginning of their relationship, but transitioned to female in 2019. She described this as very difficult for her, and that there were "no warning signs." She shared that prior to the transition, her wife had been very masculine, and described the experience as being "like my husband died." Looking back, she wonders if they should have ended the relationship at that time. Cassandra Ibarra shard that her wife has a history of hiding the truth not to hurt people, but eventually revealed that she was no longer attracted to women. She reported that this revelation had prompted her to grieve her husband and earlier stages of the relationship, but  not her wife.    No needs/concerns related to ethnicity reported when asked: No  Education/Vocation   Interpersonal concerns/problems: None. 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 Personal strengths: Kenzlee shared that she is continuing to function despite challenges, she actively looks for the positive. In the year since starting therapy, she has made significant strides toward establishing healthy habits, including showering regularly, initiating a skincare routine,  meal-prepping, cleaning her home, and losing 60 lbs. Military/work problems/concerns: None  Leisure Activities/Daily Functioning : no changes noted. Enjoys online social activities, including talking and gaming Legal Status  No Legal Problems: No Medical/Nutritional Concerns   Comments: polycystic ovarian syndrome, ADHD, fibromyalgia, questions whether she may be on the spectrum   Substance use/abuse/dependence: Shauna described herself as a social drinker, who drinks occasionally (every other week). She has previously "dabbled" with edibles, but found it difficult to find a good dose.    Comments:    Religion/Spirituality: Lovella described herself as leaning toward agnosticism. She suspects she may have religious trauma from childhood.    General Behavior: WNL Attire: WNL Gait: not observed-telehealth Motor Activity: WNL Stream of Thought - Productivity: WNL Stream of thought - Progression: WNL Stream of thought - Language:  WNL Emotional tone and reactions - Mood: WNL  Emotional tone and reactions - Affect: WNL Mental trend/Content of thoughts - Perception: WNL Mental trend/Content of thoughts - Orientation: WNL Mental trend/Content of thoughts - Memory: WNL Mental trend/Content of thoughts - General knowledge: WNL Insight: WNL Judgment: WNL Intelligence: WNL Mental Status Comment: WNL Diagnostic Summary Major Depression, Recurrent, Moderate (F33.2), Post Traumatic Stress disorder, chronic (F43.12)   Andriette LITTIE Ponto, PhD               Andriette LITTIE Ponto, PhDTime: 8:00 am -8:58 am CPT Code: 09162E-04 Diagnosis: F33.2, F43.12  Cassandra Ibarra was seen remotely using secure video conferencing. She was in her home and therapist was in her at office time of the appointment. Client is aware of risks of telehealth and consented to a virtual visit. Cassandra Ibarra reported an improvement in sleep and overall mood since her last session. She reflected upon having been able to maintain  healthy habits while grieving her cat's death, as well as her significant progress over the last year. Session also included discussion of Cat's exploration of her identify and self-expression since her divorce.  She is scheduled to be seen again in one week.     Treatment Plan Client Abilities/Strengths  Cassandra Ibarra described herself as open to listen to insights from others.  Client Treatment Preferences:  Cassandra Ibarra shared that she is ok with appointments during the daytime, but there is a chance she may need to change her appointments to better fit her work schedule. She prefers appointments on Mondays, Wednesdays, or Fridays.  Client Statement of Needs  Cassandra Ibarra shared that she is in need of a professional outlet for stress.  Treatment Level  She shared that she would probably benefit from biweekly sessions.   Symptoms  Problems Addressed  Cassandra Ibarra is going through a divorce that marks the end of a 10-year relationship, as well as the losses of several close family members. She described her childhood as "chaotic," with ongoing stress related to her father's health concerns.  Goals 1. Cassandra Ibarra will be provided with support and, when appropriate, strategies to cope with her current situation. Objective Cassandra Ibarra will develop coping strategies and increase overall motivation.  Target Date: 11/22/2024  Frequency: Biweekly  Progress: 75 Modality: Individual   Related Interventions Cassandra Ibarra will be provided with opportunities to process her experiences in session Therapist will help Cassandra Ibarra to notice and disengage from maladaptive thoughts and behaviors using CBT-based strategies Cassandra Ibarra will have opportunities to process experiences from her childhood in order to better understand their current impact Therapist will provide referrals for additional resources as appropriate  Diagnosis Axis none Major Depression, Recurrent, Moderate (F33.2) PTSD, Chronic    Axis none    Conditions For  Discharge Achievement of treatment goals and objectives   Intake  Presenting Problem   Cassandra Ibarra presented seeking individual therapy. She shared that she has had one of the hardest years of her life. She suffered COVID in September, followed by pneumonia, followed by bronchitis. She and her wife separated but were still living together at time of intake. Since 2026/01/28her  grandmother died, then her father passed away three weeks later. She had not been in contact with either for mental health purposes for 10 years. Her ex-wife suffered a motorcycle accident two weeks before the initial session and Cassandra Ibarra was caring for her during her recovery. Suicidal ideation and intent were denied. Symptoms  Fatigue/lack of energy, sleep disturbances including vivid dreams, she shared a general sense of having a low battery/emotional exhaustion, difficulty connecting with emotions and a sense of "pushing through" History of Problem   Cassandra Ibarra suffers from polycystic ovarian syndrome. End of 2017/2018 she was taking different hormone based birth controls. 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 These lasted roughly two weeks. She reported that she "woke up and cried the whole day" for two weeks. She attended therapy during this time for about two months. She also began taking antidepressant medication. She also attended therapy in 2012 following a bad breakup. She first began taking antidepressants in 2018, but reported a history of "taking and stopping them." She has taken Lexapro, buspar, trazodone, and has spent the most time taking citalopram . She reported some fear of taking new medication due to her experiences with an uncle who took Wellbutrin and had a terrible suicidal ideation episode that resulted in him going missing for three days. She shared that she has similar medication reactions to her mother.  She was not taking any prescription medications at time of intake. She is prescribed zetbound and Vyvanse but has not been able to access Vyvanse due to the shortage and has not been actively taking either. Cassandra Ibarra revealed that she had caught her father stalking her home 6 months prior to his death. Recent Trigger     Marital and Family Information  She and her wife had been married for over 8 years, and together for 10 years prior to divorcing. She shared that she had a high school relationship that ended prior to college, several relationships in college, and met her wife and married her by 4.    Present family concerns/problems: Hanah is very close with her mother. She has a sister who is 80 years older with whom she has a good relationship. Her father died in 2023/10/15. She has extended family  she sees on holidays. She has not communicated with anyone on her father's side for ten years except for one uncle. She described her father as an addict since before she was born. He underwent a quadruple bypass when Dene was 10, and became addicted to prescription painkillers following this. She  described him as manipulative and narcisstic toward people he was close with. She reported frequent drama in family relationships having to do with her father's addictions, and aspects of her childhood as "chaotic." She shared that she had been "daddy's little girl" prior to maturing and becoming more aware of his challenges. She shared that she had only spoken with her father twice in the 10 years leading to his death. She also reported writing him a long letter that he did not respond to, which was a hurtful experience for her. He reportedly died as a result of a heart attack three weeks after the death of Shellyann's grandmother, who died following a fall.   Strengths/resources in the family/friends:  She shared that she participates in an active friend community online. She has met and planned trips with several  people from the group. She is still close with two of her best friends from childhood. She also maintains a close relationship with her older sister and several work friends. However, she described herself as less social than pre-COVID. Since her divorce, Cassandra Ibarra has been involved in an online romantic relationship since late 2024.    Family background / ethnic factors: Danessa shared that her wife was a cisgendered female for the beginning of their relationship, but transitioned to female in 2019. She described this as very difficult for her, and that there were "no warning signs." She shared that prior to the transition, her wife had been very masculine, and described the experience as being "like my husband died." Looking back, she wonders if they should have ended the relationship at that time. Merrilyn shard that her wife has a history of hiding the truth not to hurt people, but eventually revealed that she was no longer attracted to women. She reported that this revelation had prompted her to grieve her husband and earlier stages of the relationship, but not her wife.    No needs/concerns related to ethnicity reported when asked: No  Education/Vocation   Interpersonal concerns/problems: None. 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 Personal strengths: Aubry shared that she is continuing to function despite challenges, she actively looks for the positive. In the year since starting therapy, she has made significant strides toward establishing healthy habits, including showering regularly, initiating a skincare routine, meal-prepping, cleaning her home, and losing 60 lbs. Military/work problems/concerns: None  Leisure Activities/Daily Functioning : no changes noted. Enjoys online social activities, including talking and gaming Legal Status  No Legal Problems: No Medical/Nutritional Concerns   Comments: polycystic ovarian syndrome, ADHD,  fibromyalgia, questions whether she may be on the spectrum   Substance use/abuse/dependence: Envy described herself as a social drinker, who drinks occasionally (every other week). She has previously "dabbled" with edibles, but found it difficult to find a good dose.    Comments:    Religion/Spirituality: Lacreshia described herself as leaning toward agnosticism. She suspects she may have religious trauma from childhood.    General Behavior: WNL Attire: WNL Gait: not observed-telehealth Motor Activity: WNL Stream of Thought - Productivity: WNL Stream of thought - Progression: WNL Stream of thought - Language:  WNL Emotional tone and reactions - Mood: WNL  Emotional tone and reactions - Affect: WNL Mental trend/Content of thoughts - Perception: WNL Mental trend/Content of thoughts - Orientation: WNL Mental trend/Content of thoughts - Memory: WNL Mental trend/Content of thoughts - General knowledge: WNL Insight: WNL Judgment: WNL Intelligence: WNL Mental Status Comment: WNL Diagnostic  Summary Major Depression, Recurrent, Moderate (F33.2), Post Traumatic Stress disorder, chronic (F43.12)     Andriette LITTIE Ponto, PhD               Andriette LITTIE Ponto, PhD               Andriette LITTIE Ponto, PhD

## 2024-05-27 ENCOUNTER — Ambulatory Visit: Admitting: Clinical

## 2024-05-27 NOTE — Progress Notes (Unsigned)
   [name], PhD

## 2024-06-13 ENCOUNTER — Ambulatory Visit: Admitting: Clinical

## 2024-06-13 NOTE — Progress Notes (Unsigned)
   [name], PhD

## 2024-06-27 ENCOUNTER — Ambulatory Visit (INDEPENDENT_AMBULATORY_CARE_PROVIDER_SITE_OTHER): Admitting: Clinical

## 2024-06-27 DIAGNOSIS — F4312 Post-traumatic stress disorder, chronic: Secondary | ICD-10-CM

## 2024-06-27 DIAGNOSIS — F331 Major depressive disorder, recurrent, moderate: Secondary | ICD-10-CM

## 2024-06-27 NOTE — Progress Notes (Signed)
 Time: 10:03 am -10:59 am CPT Code: [id number]-04 Diagnosis: F33.1, F43.12  Cassandra Ibarra was seen remotely using secure video conferencing. She was in her home and therapist was in her at office time of the appointment. Client is aware of risks of telehealth and consented to a virtual visit. She had no-showed her last appointment, and attributed this to issues that had arisen at her job. Session focused on a series of developments since her most recent appointment, including her relationship of a year coming to an end, initiating dating someone new, and work-related issues. Through this, Cassandra Ibarra has been able to maintain her healthy routine. Therapist offered validation and support, working with her to better understand her patterns in relationships. She is scheduled to be seen again in one month.    Treatment Plan Client Abilities/Strengths  Cassandra Ibarra described herself as open to listen to insights from others.  Client Treatment Preferences:  Cassandra Ibarra shared that she is ok with appointments during the daytime, but there is a chance she may need to change her appointments to better fit her work schedule. She prefers appointments on Mondays, Wednesdays, or Fridays.  Client Statement of Needs  Cassandra Ibarra shared that she is in need of a professional outlet for stress.  Treatment Level  She shared that she would probably benefit from biweekly sessions.   Symptoms  Problems Addressed  Cassandra Ibarra is going through a divorce that marks the end of a 10-year relationship, as well as the losses of several close family members. She described her childhood as "chaotic," with ongoing stress related to her father's health concerns.  Goals 1. Cassandra Ibarra will be provided with support and, when appropriate, strategies to cope with her current situation. Objective Cassandra Ibarra will develop coping strategies and increase overall motivation.  Target Date: 11/22/2024 Frequency: Biweekly  Progress: 75 Modality: Individual   Related  Interventions Cassandra Ibarra will be provided with opportunities to process her experiences in session Therapist will help Cassandra Ibarra to notice and disengage from maladaptive thoughts and behaviors using CBT-based strategies Cassandra Ibarra will have opportunities to process experiences from her childhood in order to better understand their current impact Therapist will provide referrals for additional resources as appropriate  Diagnosis Axis none Major Depression, Recurrent, Moderate (F33.2) PTSD, Chronic    Axis none    Conditions For Discharge Achievement of treatment goals and objectives   Intake  Presenting Problem   Cassandra Ibarra presented seeking individual therapy. She shared that she has had one of the hardest years of her life. She suffered COVID in September, followed by pneumonia, followed by bronchitis. She and her wife separated but were still living together at time of intake. Since January 19, 2026her  grandmother died, then her father passed away three weeks later. She had not been in contact with either for mental health purposes for 10 years. Her ex-wife suffered a motorcycle accident two weeks before the initial session and Cassandra Ibarra was caring for her during her recovery. Suicidal ideation and intent were denied. Symptoms  Fatigue/lack of energy, sleep disturbances including vivid dreams, she shared a general sense of having a low battery/emotional exhaustion, difficulty connecting with emotions and a sense of "pushing through" History of Problem   Cassandra Ibarra suffers from polycystic ovarian syndrome. End of 2017/2018 she was taking different hormone based birth controls. 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 These lasted roughly two weeks. She reported that she "woke up and cried the whole day" for  two weeks. She attended therapy during this time for about two months. She also began taking  antidepressant medication. She also attended therapy in 2012 following a bad breakup. She first began taking antidepressants in 2018, but reported a history of "taking and stopping them." She has taken Lexapro, buspar, trazodone, and has spent the most time taking citalopram . She reported some fear of taking new medication due to her experiences with an uncle who took Wellbutrin and had a terrible suicidal ideation episode that resulted in him going missing for three days. She shared that she has similar medication reactions to her mother. She was not taking any prescription medications at time of intake. She is prescribed zetbound and Vyvanse but has not been able to access Vyvanse due to the shortage and has not been actively taking either. Cassandra Ibarra revealed that she had caught her father stalking her home 6 months prior to his death. Recent Trigger     Marital and Family Information  She and her wife had been married for over 8 years, and together for 10 years prior to divorcing. She shared that she had a high school relationship that ended prior to college, several relationships in college, and met her wife and married her by 72.    Present family concerns/problems: Cassandra Ibarra is very close with her mother. She has a sister who is 5 years older with whom she has a good relationship. Her father died in Sep 26, 2023. She has extended family  she sees on holidays. She has not communicated with anyone on her father's side for ten years except for one uncle. She described her father as an addict since before she was born. He underwent a quadruple bypass when Cassandra Ibarra was 10, and became addicted to prescription painkillers following this. She described him as manipulative and narcisstic toward people he was close with. She reported frequent drama in family relationships having to do with her father's addictions, and aspects of her childhood as "chaotic." She shared that she had been "daddy's little girl" prior to  maturing and becoming more aware of his challenges. She shared that she had only spoken with her father twice in the 10 years leading to his death. She also reported writing him a long letter that he did not respond to, which was a hurtful experience for her. He reportedly died as a result of a heart attack three weeks after the death of Avaleigh's grandmother, who died following a fall.   Strengths/resources in the family/friends:  She shared that she participates in an active friend community online. She has met and planned trips with several people from the group. She is still close with two of her best friends from childhood. She also maintains a close relationship with her older sister and several work friends. However, she described herself as less social than pre-COVID. Since her divorce, Cassandra Ibarra has been involved in an online romantic relationship since late 2024.    Family background / ethnic factors: Alizabeth shared that her wife was a cisgendered female for the beginning of their relationship, but transitioned to female in 2019. She described this as very difficult for her, and that there were "no warning signs." She shared that prior to the transition, her wife had been very masculine, and described the experience as being "like my husband died." Looking back, she wonders if they should have ended the relationship at that time. Kenyona shard that her wife has a history of hiding the truth not to hurt people, but eventually revealed  that she was no longer attracted to women. She reported that this revelation had prompted her to grieve her husband and earlier stages of the relationship, but not her wife.    No needs/concerns related to ethnicity reported when asked: No  Education/Vocation   Interpersonal concerns/problems: None. 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 Personal strengths: Joydan shared that she is continuing to function  despite challenges, she actively looks for the positive. In the year since starting therapy, she has made significant strides toward establishing healthy habits, including showering regularly, initiating a skincare routine, meal-prepping, cleaning her home, and losing 60 lbs. Military/work problems/concerns: None  Leisure Activities/Daily Functioning : no changes noted. Enjoys online social activities, including talking and gaming Legal Status  No Legal Problems: No Medical/Nutritional Concerns   Comments: polycystic ovarian syndrome, ADHD, fibromyalgia, questions whether she may be on the spectrum   Substance use/abuse/dependence: Martha described herself as a social drinker, who drinks occasionally (every other week). She has previously "dabbled" with edibles, but found it difficult to find a good dose.    Comments:    Religion/Spirituality: Makaria described herself as leaning toward agnosticism. She suspects she may have religious trauma from childhood.    General Behavior: WNL Attire: WNL Gait: not observed-telehealth Motor Activity: WNL Stream of Thought - Productivity: WNL Stream of thought - Progression: WNL Stream of thought - Language:  WNL Emotional tone and reactions - Mood: WNL  Emotional tone and reactions - Affect: WNL Mental trend/Content of thoughts - Perception: WNL Mental trend/Content of thoughts - Orientation: WNL Mental trend/Content of thoughts - Memory: WNL Mental trend/Content of thoughts - General knowledge: WNL Insight: WNL Judgment: WNL Intelligence: WNL Mental Status Comment: WNL Diagnostic Summary Major Depression, Recurrent, Moderate (F33.2), Post Traumatic Stress disorder, chronic (F43.12)   Andriette LITTIE Ponto, PhD               Andriette LITTIE Ponto, PhDTime: 8:00 am -8:58 am CPT Code: 09162E-04 Diagnosis: F33.2, F43.12  Cassandra Ibarra was seen remotely using secure video conferencing. She was in her home and therapist was in her at office time  of the appointment. Client is aware of risks of telehealth and consented to a virtual visit. Cassandra Ibarra reported an improvement in sleep and overall mood since her last session. She reflected upon having been able to maintain healthy habits while grieving her cat's death, as well as her significant progress over the last year. Session also included discussion of Cat's exploration of her identify and self-expression since her divorce.  She is scheduled to be seen again in one week.     Treatment Plan Client Abilities/Strengths  Chivonne described herself as open to listen to insights from others.  Client Treatment Preferences:  Juliene shared that she is ok with appointments during the daytime, but there is a chance she may need to change her appointments to better fit her work schedule. She prefers appointments on Mondays, Wednesdays, or Fridays.  Client Statement of Needs  Albana shared that she is in need of a professional outlet for stress.  Treatment Level  She shared that she would probably benefit from biweekly sessions.   Symptoms  Problems Addressed  Shandy is going through a divorce that marks the end of a 10-year relationship, as well as the losses of several close family members. She described her childhood as "chaotic," with ongoing stress related to her father's health concerns.  Goals 1. Tavia will be  provided with support and, when appropriate, strategies to cope with her current situation. Objective Meily will develop coping strategies and increase overall motivation.  Target Date: 11/22/2024 Frequency: Biweekly  Progress: 75 Modality: Individual   Related Interventions Cassandra Ibarra will be provided with opportunities to process her experiences in session Therapist will help Brynley to notice and disengage from maladaptive thoughts and behaviors using CBT-based strategies Dannette will have opportunities to process experiences from her childhood in order to better understand their  current impact Therapist will provide referrals for additional resources as appropriate  Diagnosis Axis none Major Depression, Recurrent, Moderate (F33.2) PTSD, Chronic    Axis none    Conditions For Discharge Achievement of treatment goals and objectives   Intake  Presenting Problem   Kaileena presented seeking individual therapy. She shared that she has had one of the hardest years of her life. She suffered COVID in September, followed by pneumonia, followed by bronchitis. She and her wife separated but were still living together at time of intake. Since 01-17-2026her  grandmother died, then her father passed away three weeks later. She had not been in contact with either for mental health purposes for 10 years. Her ex-wife suffered a motorcycle accident two weeks before the initial session and Daneille was caring for her during her recovery. Suicidal ideation and intent were denied. Symptoms  Fatigue/lack of energy, sleep disturbances including vivid dreams, she shared a general sense of having a low battery/emotional exhaustion, difficulty connecting with emotions and a sense of "pushing through" History of Problem   Elda suffers from polycystic ovarian syndrome. End of 2017/2018 she was taking different hormone based birth controls. 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 These lasted roughly two weeks. She reported that she "woke up and cried the whole day" for two weeks. She attended therapy during this time for about two months. She also began taking antidepressant medication. She also attended therapy in 2012 following a bad breakup. She first began taking antidepressants in 2018, but reported a history of "taking and stopping them." She has taken Lexapro, buspar, trazodone, and has spent the most time taking citalopram . She reported some fear of taking new medication due to her  experiences with an uncle who took Wellbutrin and had a terrible suicidal ideation episode that resulted in him going missing for three days. She shared that she has similar medication reactions to her mother. She was not taking any prescription medications at time of intake. She is prescribed zetbound and Vyvanse but has not been able to access Vyvanse due to the shortage and has not been actively taking either. Tifanny revealed that she had caught her father stalking her home 6 months prior to his death. Recent Trigger     Marital and Family Information  She and her wife had been married for over 8 years, and together for 10 years prior to divorcing. She shared that she had a high school relationship that ended prior to college, several relationships in college, and met her wife and married her by 71.    Present family concerns/problems: Ellarae is very close with her mother. She has a sister who is 40 years older with whom she has a good relationship. Her father died in 09/22/23. She has extended family  she sees on holidays. She has not communicated with anyone on her father's side for ten years except for one uncle. She described her  father as an addict since before she was born. He underwent a quadruple bypass when Daniela was 10, and became addicted to prescription painkillers following this. She described him as manipulative and narcisstic toward people he was close with. She reported frequent drama in family relationships having to do with her father's addictions, and aspects of her childhood as "chaotic." She shared that she had been "daddy's little girl" prior to maturing and becoming more aware of his challenges. She shared that she had only spoken with her father twice in the 10 years leading to his death. She also reported writing him a long letter that he did not respond to, which was a hurtful experience for her. He reportedly died as a result of a heart attack three weeks after the death of  Neesa's grandmother, who died following a fall.   Strengths/resources in the family/friends:  She shared that she participates in an active friend community online. She has met and planned trips with several people from the group. She is still close with two of her best friends from childhood. She also maintains a close relationship with her older sister and several work friends. However, she described herself as less social than pre-COVID. Since her divorce, Cassandra Ibarra has been involved in an online romantic relationship since late 2024.    Family background / ethnic factors: Jenniger shared that her wife was a cisgendered female for the beginning of their relationship, but transitioned to female in 2019. She described this as very difficult for her, and that there were "no warning signs." She shared that prior to the transition, her wife had been very masculine, and described the experience as being "like my husband died." Looking back, she wonders if they should have ended the relationship at that time. Dnya shard that her wife has a history of hiding the truth not to hurt people, but eventually revealed that she was no longer attracted to women. She reported that this revelation had prompted her to grieve her husband and earlier stages of the relationship, but not her wife.    No needs/concerns related to ethnicity reported when asked: No  Education/Vocation   Interpersonal concerns/problems: None. 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 Personal strengths: Annastyn shared that she is continuing to function despite challenges, she actively looks for the positive. In the year since starting therapy, she has made significant strides toward establishing healthy habits, including showering regularly, initiating a skincare routine, meal-prepping, cleaning her home, and losing 60 lbs. Military/work problems/concerns: None  Leisure Activities/Daily  Functioning : no changes noted. Enjoys online social activities, including talking and gaming Legal Status  No Legal Problems: No Medical/Nutritional Concerns   Comments: polycystic ovarian syndrome, ADHD, fibromyalgia, questions whether she may be on the spectrum   Substance use/abuse/dependence: Kaicee described herself as a social drinker, who drinks occasionally (every other week). She has previously "dabbled" with edibles, but found it difficult to find a good dose.    Comments:    Religion/Spirituality: Camelia described herself as leaning toward agnosticism. She suspects she may have religious trauma from childhood.    General Behavior: WNL Attire: WNL Gait: not observed-telehealth Motor Activity: WNL Stream of Thought - Productivity: WNL Stream of thought - Progression: WNL Stream of thought - Language:  WNL Emotional tone and reactions - Mood: WNL  Emotional tone and reactions - Affect: WNL Mental trend/Content of thoughts - Perception: WNL Mental trend/Content of thoughts - Orientation:  WNL Mental trend/Content of thoughts - Memory: WNL Mental trend/Content of thoughts - General knowledge: WNL Insight: WNL Judgment: WNL Intelligence: WNL Mental Status Comment: WNL Diagnostic Summary Major Depression, Recurrent, Moderate (F33.2), Post Traumatic Stress disorder, chronic (F43.12)    Andriette LITTIE Ponto, PhD

## 2024-07-21 ENCOUNTER — Encounter: Payer: Self-pay | Admitting: Family Medicine

## 2024-07-21 ENCOUNTER — Ambulatory Visit: Admitting: Family Medicine

## 2024-07-21 VITALS — BP 110/70 | HR 115 | Ht 66.5 in | Wt 389.0 lb

## 2024-07-21 DIAGNOSIS — M7742 Metatarsalgia, left foot: Secondary | ICD-10-CM

## 2024-07-21 DIAGNOSIS — E559 Vitamin D deficiency, unspecified: Secondary | ICD-10-CM

## 2024-07-21 DIAGNOSIS — S39012A Strain of muscle, fascia and tendon of lower back, initial encounter: Secondary | ICD-10-CM

## 2024-07-21 DIAGNOSIS — F32A Depression, unspecified: Secondary | ICD-10-CM

## 2024-07-21 DIAGNOSIS — F909 Attention-deficit hyperactivity disorder, unspecified type: Secondary | ICD-10-CM

## 2024-07-21 DIAGNOSIS — Z6841 Body Mass Index (BMI) 40.0 and over, adult: Secondary | ICD-10-CM

## 2024-07-21 DIAGNOSIS — F419 Anxiety disorder, unspecified: Secondary | ICD-10-CM

## 2024-07-21 DIAGNOSIS — E282 Polycystic ovarian syndrome: Secondary | ICD-10-CM

## 2024-07-21 DIAGNOSIS — M797 Fibromyalgia: Secondary | ICD-10-CM

## 2024-07-23 ENCOUNTER — Encounter: Payer: Self-pay | Admitting: Family Medicine

## 2024-07-23 DIAGNOSIS — F429 Obsessive-compulsive disorder, unspecified: Secondary | ICD-10-CM | POA: Insufficient documentation

## 2024-07-23 DIAGNOSIS — R195 Other fecal abnormalities: Secondary | ICD-10-CM | POA: Insufficient documentation

## 2024-07-23 DIAGNOSIS — E559 Vitamin D deficiency, unspecified: Secondary | ICD-10-CM | POA: Insufficient documentation

## 2024-07-23 DIAGNOSIS — K269 Duodenal ulcer, unspecified as acute or chronic, without hemorrhage or perforation: Secondary | ICD-10-CM | POA: Insufficient documentation

## 2024-07-23 DIAGNOSIS — F4312 Post-traumatic stress disorder, chronic: Secondary | ICD-10-CM | POA: Insufficient documentation

## 2024-07-23 MED ORDER — ALPRAZOLAM 0.25 MG PO TABS: 0.2500 mg | ORAL_TABLET | Freq: Every day | ORAL | 0 refills | Status: AC | PRN

## 2024-07-23 MED ORDER — AMPHETAMINE-DEXTROAMPHETAMINE 30 MG PO TABS: 1.0000 | ORAL_TABLET | Freq: Every day | ORAL | 0 refills | Status: DC

## 2024-07-23 MED ORDER — SPIRONOLACTONE 50 MG PO TABS: 50.0000 mg | ORAL_TABLET | Freq: Every day | ORAL | 0 refills | Status: AC

## 2024-07-23 MED ORDER — CYCLOBENZAPRINE HCL 10 MG PO TABS: 10.0000 mg | ORAL_TABLET | Freq: Two times a day (BID) | ORAL | 0 refills | Status: AC | PRN

## 2024-07-23 MED ORDER — CITALOPRAM HYDROBROMIDE 40 MG PO TABS: 40.0000 mg | ORAL_TABLET | Freq: Every day | ORAL | 0 refills | Status: AC

## 2024-07-23 NOTE — Progress Notes (Signed)
 Assessment & Plan:   1. Metatarsalgia of left foot (Primary) New. Point tenderness metatarsal 4th-5th. No obvious skin changes. Bony prominence. Starting to improve with proper shoes. We reviewed the importance of proper shoes, wearing them even when at home. We will continue with weight loss efforts. She has already been very successful. If worsening, will consider imaging/Sports Medicine.   2. Acute myofascial strain of lumbar region, initial encounter New. Left lumbar. No point ttp. Pain worse with side bending and rotation. Reviewed symptomatic care. Okay muscle relaxer to be used sparingly prn. Discussed sedation side effects and to avoid with other sedating medications. Reviewed proper lifting and posture.   3. Attention deficit hyperactivity disorder (ADHD), unspecified ADHD type Controlled on current treatment. No side effects and concerns. Reviewed PDMP.  - amphetamine -dextroamphetamine  (ADDERALL) 30 MG tablet; Take 1 tablet by mouth daily.  Dispense: 30 tablet; Refill: 0  4. PCOS (polycystic ovarian syndrome) Actively working on weight loss. She has not been consistent with Zepbound . I encouraged her to use weekly. Continue spironolactone  for edema and hirsutism.  - spironolactone  (ALDACTONE ) 50 MG tablet; Take 1 tablet (50 mg total) by mouth daily.  Dispense: 90 tablet; Refill: 0  5. Fibromyalgia Reinforced lifestyle treatments - gentle movement, stress reductions, restorative sleep. - cyclobenzaprine  (FLEXERIL ) 10 MG tablet; Take 1 tablet (10 mg total) by mouth 2 (two) times daily as needed for muscle spasms.  Dispense: 30 tablet; Refill: 0  6. Anxiety and depression Stable. Therapist is Dr. Tenny.  - citalopram  (CELEXA ) 40 MG tablet; Take 1 tablet (40 mg total) by mouth daily.  Dispense: 90 tablet; Refill: 0 - ALPRAZolam  (XANAX ) 0.25 MG tablet; Take 1 tablet (0.25 mg total) by mouth daily as needed for anxiety.  Dispense: 30 tablet; Refill: 0  8. Morbid obesity with body  mass index (BMI) of 60.0 to 69.9 in adult [facility]) Lifestyle and medication discussions as above. We will get back on track.  Medications Discontinued During This Encounter  Medication Reason   naproxen  (NAPROSYN ) 375 MG tablet Completed Course   ibuprofen  (ADVIL ) 800 MG tablet Completed Course   benzonatate  (TESSALON ) 100 MG capsule Completed Course   predniSONE  (STERAPRED UNI-PAK 21 TAB) 10 MG (21) TBPK tablet Completed Course   cyclobenzaprine  (FLEXERIL ) 10 MG tablet Reorder   spironolactone  (ALDACTONE ) 50 MG tablet Reorder   amphetamine -dextroamphetamine  (ADDERALL) 30 MG tablet Reorder   citalopram  (CELEXA ) 40 MG tablet Reorder   ALPRAZolam  (XANAX ) 0.25 MG tablet Reorder       Geni Shutter, DO  [place] [phone] (phone) [phone] (fax)  Subjective:   Patient is well known to me from my previous clinic and is now establishing care with me as their primary care provider.  All past medical history, surgical history, allergies, family history, immunizations andmedications were updated in the EMR today and reviewed under the history and medication portions of their EMR. Review of Systems: Negative, with the exception of above mentioned in HPI.  Current Outpatient Medications:    albuterol  (VENTOLIN  HFA) 108 (90 Base) MCG/ACT inhaler, Inhale 2 puffs into the lungs every 6 (six) hours as needed for wheezing or shortness of breath., Disp: 6.7 g, Rfl: 0   Drospirenone ([name]) 4 MG TABS, 1 tablet Orally Once a day; Duration: 84 days, Disp: , Rfl:    Fiber Adult Gummies 2 g CHEW, Chew 2 gummies Orally once a day, Disp: , Rfl:    Multiple Vitamin (MULTI VITAMIN) TABS, 1 tablet Orally Once a  day, Disp: , Rfl:    Multiple Vitamins-Minerals (EMERGEN-C VITAMIN C) CHEW, Take 2 chewables by mouth once daily., Disp: , Rfl:    polyethylene glycol powder (MIRALAX) 17 GM/SCOOP powder, 1 scoop mixed with 8 ounces of fluid Orally Once a day As needed,  Disp: , Rfl:    tirzepatide  (ZEPBOUND ) 15 MG/0.5ML Pen, Inject 15 mg into the skin once a week., Disp: 2 mL, Rfl: 1   Vitamin D , Ergocalciferol , (DRISDOL ) 1.25 MG (50000 UNIT) CAPS capsule, TAKE 1 CAPSULE BY MOUTH 1 TIME WEEKLY Orally once a week; Duration: 84 days, Disp: , Rfl:    ALPRAZolam  (XANAX ) 0.25 MG tablet, Take 1 tablet (0.25 mg total) by mouth daily as needed for anxiety., Disp: 30 tablet, Rfl: 0   amphetamine -dextroamphetamine  (ADDERALL) 30 MG tablet, Take 1 tablet by mouth daily., Disp: 30 tablet, Rfl: 0   citalopram  (CELEXA ) 40 MG tablet, Take 1 tablet (40 mg total) by mouth daily., Disp: 90 tablet, Rfl: 0   cyclobenzaprine  (FLEXERIL ) 10 MG tablet, Take 1 tablet (10 mg total) by mouth 2 (two) times daily as needed for muscle spasms., Disp: 30 tablet, Rfl: 0   spironolactone  (ALDACTONE ) 50 MG tablet, Take 1 tablet (50 mg total) by mouth daily., Disp: 90 tablet, Rfl: 0   Objective:   VITALS: BP 110/70 (BP Location: Right Arm, Cuff Size: Large)   Pulse (!) 115   Ht 5' 6.5 (1.689 m)   Wt (!) 389 lb (176.4 kg)   SpO2 96%   BMI 61.85 kg/m   Wt Readings from Last 3 Encounters:  07/21/24 (!) 389 lb (176.4 kg)  10/29/23 (!) 461 lb (209.1 kg)  09/04/23 (!) 468 lb (212.3 kg)   PHYSICAL EXAM:  General: The patient is well-nourished, in no acute distress. The vital signs are documented above. Cardiac: There is a regular rate and rhythm.  Pulmonary: There is good air exchange bilaterally without wheezing or rales. Skin: There are no ulcers or rashes noted. Psychiatric: The patient has a normal affect. Musculoskeletal: See A/P.   LABS: Lab Results  Component Value Date   CREATININE 0.74 09/04/2023   BUN 8 09/04/2023   NA 141 09/04/2023   K 3.8 09/04/2023   CL 105 09/04/2023   CO2 25 09/04/2023   Lab Results  Component Value Date   ALT 30 02/08/2019   AST 24 02/08/2019   ALKPHOS 60 02/08/2019   BILITOT 0.5 02/08/2019   Lab Results  Component Value Date   HGBA1C 5.6  05/18/2015   No results found for: INSULIN Lab Results  Component Value Date   TSH 5.110 (H) 06/20/2019   Lab Results  Component Value Date   CHOL 158 05/18/2015   HDL 36.00 (L) 05/18/2015   LDLCALC 96 05/18/2015   TRIG 133.0 05/18/2015   CHOLHDL 4 05/18/2015   Lab Results  Component Value Date   VD25OH 9.75 (L) 05/18/2015   Lab Results  Component Value Date   WBC 7.0 09/04/2023   HGB 13.3 09/04/2023   HCT 41.5 09/04/2023   MCV 85.0 09/04/2023   PLT 288 09/04/2023

## 2024-07-25 MED ORDER — LISDEXAMFETAMINE DIMESYLATE 50 MG PO CAPS: 50.0000 mg | ORAL_CAPSULE | Freq: Every day | ORAL | 0 refills | Status: DC

## 2024-07-25 NOTE — Telephone Encounter (Signed)
Please see message from pt below.  Thank you

## 2024-07-25 NOTE — Addendum Note (Signed)
 Addended by: PRENTISS FRIEZE R on: 07/25/2024 12:24 PM   Modules accepted: Orders

## 2024-08-05 ENCOUNTER — Ambulatory Visit: Admitting: Clinical

## 2024-08-05 DIAGNOSIS — F4312 Post-traumatic stress disorder, chronic: Secondary | ICD-10-CM

## 2024-08-05 DIAGNOSIS — F331 Major depressive disorder, recurrent, moderate: Secondary | ICD-10-CM

## 2024-08-05 NOTE — Progress Notes (Signed)
 Time: 12:03 pm -12:50 pm CPT Code: [id number]-04 Diagnosis: F33.1, F43.12  Cassandra Ibarra was seen remotely using secure video conferencing. She was in her home and therapist was in her at office time of the appointment. Client is aware of risks of telehealth and consented to a virtual visit. Cassandra Ibarra reported continued significant stress related to her job. She noted, however, that she has not felt depressed, and has been able to maintain self-care practices. Therapist engaged her in consideration of her options and what she is able to do under the current circumstances to cope and care for herself. She is scheduled to be seen again in two weeks.    Treatment Plan Client Abilities/Strengths  Cassandra Ibarra described herself as open to listen to insights from others.  Client Treatment Preferences:  Cassandra Ibarra shared that she is ok with appointments during the daytime, but there is a chance she may need to change her appointments to better fit her work schedule. She prefers appointments on Mondays, Wednesdays, or Fridays.  Client Statement of Needs  Cassandra Ibarra shared that she is in need of a professional outlet for stress.  Treatment Level  She shared that she would probably benefit from biweekly sessions.   Symptoms  Problems Addressed  Cassandra Ibarra is going through a divorce that marks the end of a 10-year relationship, as well as the losses of several close family members. She described her childhood as "chaotic," with ongoing stress related to her father's health concerns.  Goals 1. Cassandra Ibarra will be provided with support and, when appropriate, strategies to cope with her current situation. Objective Cassandra Ibarra will develop coping strategies and increase overall motivation.  Target Date: 11/22/2024 Frequency: Biweekly  Progress: 75 Modality: Individual   Related Interventions Cassandra Ibarra will be provided with opportunities to process her experiences in session Therapist will help Cassandra Ibarra to notice and disengage from maladaptive  thoughts and behaviors using CBT-based strategies Cassandra Ibarra will have opportunities to process experiences from her childhood in order to better understand their current impact Therapist will provide referrals for additional resources as appropriate  Diagnosis Axis none Major Depression, Recurrent, Moderate (F33.2) PTSD, Chronic    Axis none    Conditions For Discharge Achievement of treatment goals and objectives   Intake  Presenting Problem   Cassandra Ibarra presented seeking individual therapy. She shared that she has had one of the hardest years of her life. She suffered COVID in September, followed by pneumonia, followed by bronchitis. She and her wife separated but were still living together at time of intake. Since 01/08/2026her  grandmother died, then her father passed away three weeks later. She had not been in contact with either for mental health purposes for 10 years. Her ex-wife suffered a motorcycle accident two weeks before the initial session and Cassandra Ibarra was caring for her during her recovery. Suicidal ideation and intent were denied. Symptoms  Fatigue/lack of energy, sleep disturbances including vivid dreams, she shared a general sense of having a low battery/emotional exhaustion, difficulty connecting with emotions and a sense of "pushing through" History of Problem   Cassandra Ibarra suffers from polycystic ovarian syndrome. End of 2017/2018 she was taking different hormone based birth controls. 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 These lasted roughly two weeks. She reported that she "woke up and cried the whole day" for two weeks. She attended therapy during this time for about two months. She also began taking antidepressant medication. She also attended  therapy in 2012 following a bad breakup. She first began taking antidepressants in 2018, but reported a history of "taking and  stopping them." She has taken Lexapro, buspar, trazodone, and has spent the most time taking citalopram . She reported some fear of taking new medication due to her experiences with an uncle who took Wellbutrin and had a terrible suicidal ideation episode that resulted in him going missing for three days. She shared that she has similar medication reactions to her mother. She was not taking any prescription medications at time of intake. She is prescribed zetbound and Vyvanse  but has not been able to access Vyvanse  due to the shortage and has not been actively taking either. Cassandra Ibarra revealed that she had caught her father stalking her home 6 months prior to his death. Recent Trigger     Marital and Family Information  She and her wife had been married for over 8 years, and together for 10 years prior to divorcing. She shared that she had a high school relationship that ended prior to college, several relationships in college, and met her wife and married her by 37.    Present family concerns/problems: Cassandra Ibarra is very close with her mother. She has a sister who is 54 years older with whom she has a good relationship. Her father died in Sep 12, 2023. She has extended family  she sees on holidays. She has not communicated with anyone on her father's side for ten years except for one uncle. She described her father as an addict since before she was born. He underwent a quadruple bypass when Cassandra Ibarra was 10, and became addicted to prescription painkillers following this. She described him as manipulative and narcisstic toward people he was close with. She reported frequent drama in family relationships having to do with her father's addictions, and aspects of her childhood as "chaotic." She shared that she had been "daddy's little girl" prior to maturing and becoming more aware of his challenges. She shared that she had only spoken with her father twice in the 10 years leading to his death. She also reported writing him  a long letter that he did not respond to, which was a hurtful experience for her. He reportedly died as a result of a heart attack three weeks after the death of Cassandra Ibarra's grandmother, who died following a fall.   Strengths/resources in the family/friends:  She shared that she participates in an active friend community online. She has met and planned trips with several people from the group. She is still close with two of her best friends from childhood. She also maintains a close relationship with her older sister and several work friends. However, she described herself as less social than pre-COVID. Since her divorce, Cassandra Ibarra has been involved in an online romantic relationship since late 2024.    Family background / ethnic factors: Terrye shared that her wife was a cisgendered female for the beginning of their relationship, but transitioned to female in 2019. She described this as very difficult for her, and that there were "no warning signs." She shared that prior to the transition, her wife had been very masculine, and described the experience as being "like my husband died." Looking back, she wonders if they should have ended the relationship at that time. Briele shard that her wife has a history of hiding the truth not to hurt people, but eventually revealed that she was no longer attracted to women. She reported that this revelation had prompted her to grieve her husband and  earlier stages of the relationship, but not her wife.    No needs/concerns related to ethnicity reported when asked: No  Education/Vocation   Interpersonal concerns/problems: None. 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 Personal strengths: Dorthula shared that she is continuing to function despite challenges, she actively looks for the positive. In the year since starting therapy, she has made significant strides toward establishing healthy habits, including showering  regularly, initiating a skincare routine, meal-prepping, cleaning her home, and losing 60 lbs. Military/work problems/concerns: None  Leisure Activities/Daily Functioning : no changes noted. Enjoys online social activities, including talking and gaming Legal Status  No Legal Problems: No Medical/Nutritional Concerns   Comments: polycystic ovarian syndrome, ADHD, fibromyalgia, questions whether she may be on the spectrum   Substance use/abuse/dependence: Cassandra Ibarra described herself as a social drinker, who drinks occasionally (every other week). She has previously "dabbled" with edibles, but found it difficult to find a good dose.    Comments:    Religion/Spirituality: Cassandra Ibarra described herself as leaning toward agnosticism. She suspects she may have religious trauma from childhood.    General Behavior: WNL Attire: WNL Gait: not observed-telehealth Motor Activity: WNL Stream of Thought - Productivity: WNL Stream of thought - Progression: WNL Stream of thought - Language:  WNL Emotional tone and reactions - Mood: WNL  Emotional tone and reactions - Affect: WNL Mental trend/Content of thoughts - Perception: WNL Mental trend/Content of thoughts - Orientation: WNL Mental trend/Content of thoughts - Memory: WNL Mental trend/Content of thoughts - General knowledge: WNL Insight: WNL Judgment: WNL Intelligence: WNL Mental Status Comment: WNL Diagnostic Summary Major Depression, Recurrent, Moderate (F33.2), Post Traumatic Stress disorder, chronic (F43.12)   Cassandra Ibarra Cassandra Ponto, Cassandra Ibarra               Cassandra Ibarra Cassandra Ibarra, PhDTime: 8:00 am -8:58 am CPT Code: 09162E-04 Diagnosis: F33.2, F43.12  Cassandra Ibarra was seen remotely using secure video conferencing. She was in her home and therapist was in her at office time of the appointment. Client is aware of risks of telehealth and consented to a virtual visit. Cassandra Ibarra reported an improvement in sleep and overall mood since her last session. She  reflected upon having been able to maintain healthy habits while grieving her cat's death, as well as her significant progress over the last year. Session also included discussion of Cat's exploration of her identify and self-expression since her divorce.  She is scheduled to be seen again in one week.     Treatment Plan Client Abilities/Strengths  Cassandra Ibarra described herself as open to listen to insights from others.  Client Treatment Preferences:  Cassandra Ibarra shared that she is ok with appointments during the daytime, but there is a chance she may need to change her appointments to better fit her work schedule. She prefers appointments on Mondays, Wednesdays, or Fridays.  Client Statement of Needs  Cassandra Ibarra shared that she is in need of a professional outlet for stress.  Treatment Level  She shared that she would probably benefit from biweekly sessions.   Symptoms  Problems Addressed  Cassandra Ibarra is going through a divorce that marks the end of a 10-year relationship, as well as the losses of several close family members. She described her childhood as "chaotic," with ongoing stress related to her father's health concerns.  Goals 1. Cassandra Ibarra will be provided with support and, when appropriate, strategies to cope with her current situation. Objective Cassandra Ibarra will develop coping strategies and increase  overall motivation.  Target Date: 11/22/2024 Frequency: Biweekly  Progress: 75 Modality: Individual   Related Interventions Cassandra Ibarra will be provided with opportunities to process her experiences in session Therapist will help Cassandra Ibarra to notice and disengage from maladaptive thoughts and behaviors using CBT-based strategies Noni will have opportunities to process experiences from her childhood in order to better understand their current impact Therapist will provide referrals for additional resources as appropriate  Diagnosis Axis none Major Depression, Recurrent, Moderate (F33.2) PTSD, Chronic     Axis none    Conditions For Discharge Achievement of treatment goals and objectives   Intake  Presenting Problem   Noemy presented seeking individual therapy. She shared that she has had one of the hardest years of her life. She suffered COVID in September, followed by pneumonia, followed by bronchitis. She and her wife separated but were still living together at time of intake. Since 01-25-2026her  grandmother died, then her father passed away three weeks later. She had not been in contact with either for mental health purposes for 10 years. Her ex-wife suffered a motorcycle accident two weeks before the initial session and Marrissa was caring for her during her recovery. Suicidal ideation and intent were denied. Symptoms  Fatigue/lack of energy, sleep disturbances including vivid dreams, she shared a general sense of having a low battery/emotional exhaustion, difficulty connecting with emotions and a sense of "pushing through" History of Problem   Florina suffers from polycystic ovarian syndrome. End of 2017/2018 she was taking different hormone based birth controls. 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 These lasted roughly two weeks. She reported that she "woke up and cried the whole day" for two weeks. She attended therapy during this time for about two months. She also began taking antidepressant medication. She also attended therapy in 2012 following a bad breakup. She first began taking antidepressants in 2018, but reported a history of "taking and stopping them." She has taken Lexapro, buspar, trazodone, and has spent the most time taking citalopram . She reported some fear of taking new medication due to her experiences with an uncle who took Wellbutrin and had a terrible suicidal ideation episode that resulted in him going missing for three days. She shared that she has similar  medication reactions to her mother. She was not taking any prescription medications at time of intake. She is prescribed zetbound and Vyvanse  but has not been able to access Vyvanse  due to the shortage and has not been actively taking either. Monta revealed that she had caught her father stalking her home 6 months prior to his death. Recent Trigger     Marital and Family Information  She and her wife had been married for over 8 years, and together for 10 years prior to divorcing. She shared that she had a high school relationship that ended prior to college, several relationships in college, and met her wife and married her by 76.    Present family concerns/problems: Graciela is very close with her mother. She has a sister who is 34 years older with whom she has a good relationship. Her father died in 10-03-2023. She has extended family  she sees on holidays. She has not communicated with anyone on her father's side for ten years except for one uncle. She described her father as an addict since before she was born. He underwent a quadruple bypass when Nivea was 10, and became addicted  to prescription painkillers following this. She described him as manipulative and narcisstic toward people he was close with. She reported frequent drama in family relationships having to do with her father's addictions, and aspects of her childhood as "chaotic." She shared that she had been "daddy's little girl" prior to maturing and becoming more aware of his challenges. She shared that she had only spoken with her father twice in the 10 years leading to his death. She also reported writing him a long letter that he did not respond to, which was a hurtful experience for her. He reportedly died as a result of a heart attack three weeks after the death of Landri's grandmother, who died following a fall.   Strengths/resources in the family/friends:  She shared that she participates in an active friend community online. She  has met and planned trips with several people from the group. She is still close with two of her best friends from childhood. She also maintains a close relationship with her older sister and several work friends. However, she described herself as less social than pre-COVID. Since her divorce, Cassandra Ibarra has been involved in an online romantic relationship since late 2024.    Family background / ethnic factors: Palmer shared that her wife was a cisgendered female for the beginning of their relationship, but transitioned to female in 2019. She described this as very difficult for her, and that there were "no warning signs." She shared that prior to the transition, her wife had been very masculine, and described the experience as being "like my husband died." Looking back, she wonders if they should have ended the relationship at that time. Elke shard that her wife has a history of hiding the truth not to hurt people, but eventually revealed that she was no longer attracted to women. She reported that this revelation had prompted her to grieve her husband and earlier stages of the relationship, but not her wife.    No needs/concerns related to ethnicity reported when asked: No  Education/Vocation   Interpersonal concerns/problems: None. 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 Personal strengths: Georgia shared that she is continuing to function despite challenges, she actively looks for the positive. In the year since starting therapy, she has made significant strides toward establishing healthy habits, including showering regularly, initiating a skincare routine, meal-prepping, cleaning her home, and losing 60 lbs. Military/work problems/concerns: None  Leisure Activities/Daily Functioning : no changes noted. Enjoys online social activities, including talking and gaming Legal Status  No Legal Problems: No Medical/Nutritional Concerns   Comments:  polycystic ovarian syndrome, ADHD, fibromyalgia, questions whether she may be on the spectrum   Substance use/abuse/dependence: Breniyah described herself as a social drinker, who drinks occasionally (every other week). She has previously "dabbled" with edibles, but found it difficult to find a good dose.    Comments:    Religion/Spirituality: Stormey described herself as leaning toward agnosticism. She suspects she may have religious trauma from childhood.    General Behavior: WNL Attire: WNL Gait: not observed-telehealth Motor Activity: WNL Stream of Thought - Productivity: WNL Stream of thought - Progression: WNL Stream of thought - Language:  WNL Emotional tone and reactions - Mood: WNL  Emotional tone and reactions - Affect: WNL Mental trend/Content of thoughts - Perception: WNL Mental trend/Content of thoughts - Orientation: WNL Mental trend/Content of thoughts - Memory: WNL Mental trend/Content of thoughts - General knowledge: WNL Insight: WNL Judgment: WNL Intelligence:  WNL Mental Status Comment: WNL Diagnostic Summary Major Depression, Recurrent, Moderate (F33.2), Post Traumatic Stress disorder, chronic (F43.12)    Cassandra Ibarra Cassandra Ponto, Cassandra Ibarra               Cassandra Ibarra Cassandra Ponto, Cassandra Ibarra

## 2024-08-16 ENCOUNTER — Ambulatory Visit: Admitting: Clinical

## 2024-08-16 DIAGNOSIS — F4312 Post-traumatic stress disorder, chronic: Secondary | ICD-10-CM

## 2024-08-16 DIAGNOSIS — F331 Major depressive disorder, recurrent, moderate: Secondary | ICD-10-CM

## 2024-08-16 NOTE — Progress Notes (Signed)
 Time: 2:03 pm -2:57 pm CPT Code: [id number] Diagnosis: F33.1, F43.12  Cassandra Ibarra was seen remotely using secure video conferencing. She was in her office and therapist was in her at office time of the appointment. Cassandra Ibarra reflected upon increasing work-related stress. She clarified that she is not currently in a depressive episode, but fears falling back into one. Therapist engaged her in consideration of differences between her current situations and situations that have triggered depressive episodes, and engaged her in consideration of action steps. Cassandra Ibarra created a plan to rest over the holidays and after doing so, draft a letter querying what will be removed from her current workload to allow her to take on additional responsibilities. Cassandra Ibarra is aware of risks of telehealth and consented to a virtual visit. She is scheduled to be seen again in three weeks.    Treatment Plan Cassandra Ibarra Abilities/Strengths  Cassandra Ibarra described herself as open to listen to insights from others.  Cassandra Ibarra Treatment Preferences:  Cassandra Ibarra shared that she is ok with appointments during the daytime, but there is a chance she may need to change her appointments to better fit her work schedule. She prefers appointments on Mondays, Wednesdays, or Fridays.  Cassandra Ibarra Statement of Needs  Cassandra Ibarra shared that she is in need of a professional outlet for stress.  Treatment Level  She shared that she would probably benefit from biweekly sessions.   Symptoms  Problems Addressed  Cassandra Ibarra is going through a divorce that marks the end of a 10-year relationship, as well as the losses of several close family members. She described her childhood as chaotic, with ongoing stress related to her father's health concerns.  Goals 1. Cassandra Ibarra will be provided with support and, when appropriate, strategies to cope with her current situation. Objective Cassandra Ibarra will develop coping strategies and increase overall motivation.  Target Date: 11/22/2024 Frequency:  Biweekly  Progress: 75 Modality: Individual   Related Interventions Cassandra Ibarra will be provided with opportunities to process her experiences in session Therapist will help Cassandra Ibarra to notice and disengage from maladaptive thoughts and behaviors using CBT-based strategies Cassandra Ibarra will have opportunities to process experiences from her childhood in order to better understand their current impact Therapist will provide referrals for additional resources as appropriate  Diagnosis Axis none Major Depression, Recurrent, Moderate (F33.2) PTSD, Chronic    Axis none    Conditions For Discharge Achievement of treatment goals and objectives   Intake  Presenting Problem   Cassandra Ibarra presented seeking individual therapy. She shared that she has had one of the hardest years of her life. She suffered COVID in September, followed by pneumonia, followed by bronchitis. She and her wife separated but were still living together at time of intake. Since Jan 14, 2026her  grandmother died, then her father passed away three weeks later. She had not been in contact with either for mental health purposes for 10 years. Her ex-wife suffered a motorcycle accident two weeks before the initial session and Cassandra Ibarra was caring for her during her recovery. Suicidal ideation and intent were denied. Symptoms  Fatigue/lack of energy, sleep disturbances including vivid dreams, she shared a general sense of having a low battery/emotional exhaustion, difficulty connecting with emotions and a sense of pushing through History of Problem   Cassandra Ibarra suffers from polycystic ovarian syndrome. End of 2017/2018 she was taking different hormone based birth controls. 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 These lasted roughly two weeks. She reported  that she woke up and cried the whole day for two weeks. She attended therapy during this time  for about two months. She also began taking antidepressant medication. She also attended therapy in 2012 following a bad breakup. She first began taking antidepressants in 2018, but reported a history of taking and stopping them. She has taken Lexapro, buspar, trazodone, and has spent the most time taking citalopram . She reported some fear of taking new medication due to her experiences with an uncle who took Wellbutrin and had a terrible suicidal ideation episode that resulted in him going missing for three days. She shared that she has similar medication reactions to her mother. She was not taking any prescription medications at time of intake. She is prescribed zetbound and Vyvanse  but has not been able to access Vyvanse  due to the shortage and has not been actively taking either. Cassandra Ibarra revealed that she had caught her father stalking her home 6 months prior to his death. Recent Trigger     Marital and Family Information  She and her wife had been married for over 8 years, and together for 10 years prior to divorcing. She shared that she had a high school relationship that ended prior to college, several relationships in college, and met her wife and married her by 61.    Present family concerns/problems: Cassandra Ibarra is very close with her mother. She has a sister who is 39 years older with whom she has a good relationship. Her father died in 2023-09-27. She has extended family  she sees on holidays. She has not communicated with anyone on her father's side for ten years except for one uncle. She described her father as an addict since before she was born. He underwent a quadruple bypass when Cassandra Ibarra was 10, and became addicted to prescription painkillers following this. She described him as manipulative and narcisstic toward people he was close with. She reported frequent drama in family relationships having to do with her father's addictions, and aspects of her childhood as chaotic. She shared that she  had been daddy's little girl prior to maturing and becoming more aware of his challenges. She shared that she had only spoken with her father twice in the 10 years leading to his death. She also reported writing him a long letter that he did not respond to, which was a hurtful experience for her. He reportedly died as a result of a heart attack three weeks after the death of Rosan's grandmother, who died following a fall.   Strengths/resources in the family/friends:  She shared that she participates in an active friend community online. She has met and planned trips with several people from the group. She is still close with two of her best friends from childhood. She also maintains a close relationship with her older sister and several work friends. However, she described herself as less social than pre-COVID. Since her divorce, Cassandra Ibarra has been involved in an online romantic relationship since late 2024.    Family background / ethnic factors: Koya shared that her wife was a cisgendered female for the beginning of their relationship, but transitioned to female in 2019. She described this as very difficult for her, and that there were no warning signs. She shared that prior to the transition, her wife had been very masculine, and described the experience as being like my husband died. Looking back, she wonders if they should have ended the relationship at that time. Mina shard that her wife has a history of  hiding the truth not to hurt people, but eventually revealed that she was no longer attracted to women. She reported that this revelation had prompted her to grieve her husband and earlier stages of the relationship, but not her wife.    No needs/concerns related to ethnicity reported when asked: No  Education/Vocation   Interpersonal concerns/problems: None. 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 Personal strengths: Noriah shared  that she is continuing to function despite challenges, she actively looks for the positive. In the year since starting therapy, she has made significant strides toward establishing healthy habits, including showering regularly, initiating a skincare routine, meal-prepping, cleaning her home, and losing 60 lbs. Military/work problems/concerns: None  Leisure Activities/Daily Functioning : no changes noted. Enjoys online social activities, including talking and gaming Legal Status  No Legal Problems: No Medical/Nutritional Concerns   Comments: polycystic ovarian syndrome, ADHD, fibromyalgia, questions whether she may be on the spectrum   Substance use/abuse/dependence: Adriana described herself as a social drinker, who drinks occasionally (every other week). She has previously dabbled with edibles, but found it difficult to find a good dose.    Comments:    Religion/Spirituality: Anaclara described herself as leaning toward agnosticism. She suspects she may have religious trauma from childhood.    General Behavior: WNL Attire: WNL Gait: not observed-telehealth Motor Activity: WNL Stream of Thought - Productivity: WNL Stream of thought - Progression: WNL Stream of thought - Language:  WNL Emotional tone and reactions - Mood: WNL  Emotional tone and reactions - Affect: WNL Mental trend/Content of thoughts - Perception: WNL Mental trend/Content of thoughts - Orientation: WNL Mental trend/Content of thoughts - Memory: WNL Mental trend/Content of thoughts - General knowledge: WNL Insight: WNL Judgment: WNL Intelligence: WNL Mental Status Comment: WNL Diagnostic Summary Major Depression, Recurrent, Moderate (F33.2), Post Traumatic Stress disorder, chronic (F43.12)   Cassandra LITTIE Ponto, Cassandra Ibarra               Cassandra Ibarra, PhDTime: 8:00 am -8:58 am CPT Code: [id number]-04 Diagnosis: F33.2, F43.12  Cassandra Ibarra was seen remotely using secure video conferencing. She was in her home and  therapist was in her at office time of the appointment. Cassandra Ibarra is aware of risks of telehealth and consented to a virtual visit. Cassandra Ibarra reported an improvement in sleep and overall mood since her last session. She reflected upon having been able to maintain healthy habits while grieving her cat's death, as well as her significant progress over the last year. Session also included discussion of Cat's exploration of her identify and self-expression since her divorce.  She is scheduled to be seen again in one week.     Treatment Plan Cassandra Ibarra Abilities/Strengths  Cassandra Ibarra described herself as open to listen to insights from others.  Cassandra Ibarra Treatment Preferences:  Cassandra Ibarra shared that she is ok with appointments during the daytime, but there is a chance she may need to change her appointments to better fit her work schedule. She prefers appointments on Mondays, Wednesdays, or Fridays.  Cassandra Ibarra Statement of Needs  Cassandra Ibarra shared that she is in need of a professional outlet for stress.  Treatment Level  She shared that she would probably benefit from biweekly sessions.   Symptoms  Problems Addressed  Cassandra Ibarra is going through a divorce that marks the end of a 10-year relationship, as well as the losses of several close family members. She described her childhood as chaotic, with ongoing stress related to  her father's health concerns.  Goals 1. Cassandra Ibarra will be provided with support and, when appropriate, strategies to cope with her current situation. Objective Cassandra Ibarra will develop coping strategies and increase overall motivation.  Target Date: 11/22/2024 Frequency: Biweekly  Progress: 75 Modality: Individual   Related Interventions Cassandra Ibarra will be provided with opportunities to process her experiences in session Therapist will help Kashayla to notice and disengage from maladaptive thoughts and behaviors using CBT-based strategies Amena will have opportunities to process experiences from her childhood  in order to better understand their current impact Therapist will provide referrals for additional resources as appropriate  Diagnosis Axis none Major Depression, Recurrent, Moderate (F33.2) PTSD, Chronic    Axis none    Conditions For Discharge Achievement of treatment goals and objectives   Intake  Presenting Problem   Cassandra Ibarra presented seeking individual therapy. She shared that she has had one of the hardest years of her life. She suffered COVID in September, followed by pneumonia, followed by bronchitis. She and her wife separated but were still living together at time of intake. Since 01-29-26her  grandmother died, then her father passed away three weeks later. She had not been in contact with either for mental health purposes for 10 years. Her ex-wife suffered a motorcycle accident two weeks before the initial session and Cassandra Ibarra was caring for her during her recovery. Suicidal ideation and intent were denied. Symptoms  Fatigue/lack of energy, sleep disturbances including vivid dreams, she shared a general sense of having a low battery/emotional exhaustion, difficulty connecting with emotions and a sense of pushing through History of Problem   Cassandra Ibarra suffers from polycystic ovarian syndrome. End of 2017/2018 she was taking different hormone based birth controls. 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 These lasted roughly two weeks. She reported that she woke up and cried the whole day for two weeks. She attended therapy during this time for about two months. She also began taking antidepressant medication. She also attended therapy in 2012 following a bad breakup. She first began taking antidepressants in 2018, but reported a history of taking and stopping them. She has taken Lexapro, buspar, trazodone, and has spent the most time taking citalopram . She reported some fear of  taking new medication due to her experiences with an uncle who took Wellbutrin and had a terrible suicidal ideation episode that resulted in him going missing for three days. She shared that she has similar medication reactions to her mother. She was not taking any prescription medications at time of intake. She is prescribed zetbound and Vyvanse  but has not been able to access Vyvanse  due to the shortage and has not been actively taking either. Cassandra Ibarra revealed that she had caught her father stalking her home 6 months prior to his death. Recent Trigger     Marital and Family Information  She and her wife had been married for over 8 years, and together for 10 years prior to divorcing. She shared that she had a high school relationship that ended prior to college, several relationships in college, and met her wife and married her by 89.    Present family concerns/problems: Cassandra Ibarra is very close with her mother. She has a sister who is 34 years older with whom she has a good relationship. Her father died in 2023-10-22. She has extended family  she sees on holidays. She has not communicated with anyone on her father's side  for ten years except for one uncle. She described her father as an addict since before she was born. He underwent a quadruple bypass when Cassandra Ibarra was 10, and became addicted to prescription painkillers following this. She described him as manipulative and narcisstic toward people he was close with. She reported frequent drama in family relationships having to do with her father's addictions, and aspects of her childhood as chaotic. She shared that she had been daddy's little girl prior to maturing and becoming more aware of his challenges. She shared that she had only spoken with her father twice in the 10 years leading to his death. She also reported writing him a long letter that he did not respond to, which was a hurtful experience for her. He reportedly died as a result of a heart attack  three weeks after the death of Cassandra Ibarra's grandmother, who died following a fall.   Strengths/resources in the family/friends:  She shared that she participates in an active friend community online. She has met and planned trips with several people from the group. She is still close with two of her best friends from childhood. She also maintains a close relationship with her older sister and several work friends. However, she described herself as less social than pre-COVID. Since her divorce, Cassandra Ibarra has been involved in an online romantic relationship since late 2024.    Family background / ethnic factors: Cassandra Ibarra shared that her wife was a cisgendered female for the beginning of their relationship, but transitioned to female in 2019. She described this as very difficult for her, and that there were no warning signs. She shared that prior to the transition, her wife had been very masculine, and described the experience as being like my husband died. Looking back, she wonders if they should have ended the relationship at that time. Cassandra Ibarra shard that her wife has a history of hiding the truth not to hurt people, but eventually revealed that she was no longer attracted to women. She reported that this revelation had prompted her to grieve her husband and earlier stages of the relationship, but not her wife.    No needs/concerns related to ethnicity reported when asked: No  Education/Vocation   Interpersonal concerns/problems: None. 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 Personal strengths: Chenise shared that she is continuing to function despite challenges, she actively looks for the positive. In the year since starting therapy, she has made significant strides toward establishing healthy habits, including showering regularly, initiating a skincare routine, meal-prepping, cleaning her home, and losing 60 lbs. Military/work problems/concerns:  None  Leisure Activities/Daily Functioning : no changes noted. Enjoys online social activities, including talking and gaming Legal Status  No Legal Problems: No Medical/Nutritional Concerns   Comments: polycystic ovarian syndrome, ADHD, fibromyalgia, questions whether she may be on the spectrum   Substance use/abuse/dependence: Narda described herself as a social drinker, who drinks occasionally (every other week). She has previously dabbled with edibles, but found it difficult to find a good dose.    Comments:    Religion/Spirituality: Adine described herself as leaning toward agnosticism. She suspects she may have religious trauma from childhood.    General Behavior: WNL Attire: WNL Gait: not observed-telehealth Motor Activity: WNL Stream of Thought - Productivity: WNL Stream of thought - Progression: WNL Stream of thought - Language:  WNL Emotional tone and reactions - Mood: WNL  Emotional tone and reactions - Affect: WNL Mental trend/Content of  thoughts - Perception: WNL Mental trend/Content of thoughts - Orientation: WNL Mental trend/Content of thoughts - Memory: WNL Mental trend/Content of thoughts - General knowledge: WNL Insight: WNL Judgment: WNL Intelligence: WNL Mental Status Comment: WNL Diagnostic Summary Major Depression, Recurrent, Moderate (F33.2), Post Traumatic Stress disorder, chronic (F43.12)     Cassandra LITTIE Ponto, Cassandra Ibarra               Cassandra LITTIE Ponto, Cassandra Ibarra

## 2024-08-30 ENCOUNTER — Encounter: Payer: Self-pay | Admitting: Family Medicine

## 2024-08-30 ENCOUNTER — Ambulatory Visit: Admitting: Family Medicine

## 2024-08-30 VITALS — BP 122/74 | HR 122 | Temp 98.4°F | Ht 67.0 in | Wt 390.2 lb

## 2024-08-30 DIAGNOSIS — E559 Vitamin D deficiency, unspecified: Secondary | ICD-10-CM

## 2024-08-30 DIAGNOSIS — E282 Polycystic ovarian syndrome: Secondary | ICD-10-CM

## 2024-08-30 DIAGNOSIS — Z6841 Body Mass Index (BMI) 40.0 and over, adult: Secondary | ICD-10-CM

## 2024-08-30 DIAGNOSIS — M797 Fibromyalgia: Secondary | ICD-10-CM

## 2024-08-30 DIAGNOSIS — F900 Attention-deficit hyperactivity disorder, predominantly inattentive type: Secondary | ICD-10-CM

## 2024-08-30 DIAGNOSIS — F909 Attention-deficit hyperactivity disorder, unspecified type: Secondary | ICD-10-CM

## 2024-08-30 DIAGNOSIS — Z79899 Other long term (current) drug therapy: Secondary | ICD-10-CM

## 2024-08-30 DIAGNOSIS — M5416 Radiculopathy, lumbar region: Secondary | ICD-10-CM

## 2024-08-30 MED ORDER — GABAPENTIN 100 MG PO CAPS: 100.0000 mg | ORAL_CAPSULE | Freq: Three times a day (TID) | ORAL | 3 refills | Status: DC

## 2024-08-30 MED ORDER — LISDEXAMFETAMINE DIMESYLATE 50 MG PO CAPS: 50.0000 mg | ORAL_CAPSULE | Freq: Every day | ORAL | 0 refills | Status: AC

## 2024-08-30 NOTE — Progress Notes (Unsigned)
 "    Diagnoses and Orders:   1. PCOS (polycystic ovarian syndrome)   2. Fibromyalgia   3. Vitamin D  deficiency   4. Medication management   5. Attention deficit hyperactivity disorder (ADHD), unspecified ADHD type    Meds ordered this encounter  Medications   gabapentin (NEURONTIN) 100 MG capsule    Sig: Take 1 capsule (100 mg total) by mouth 3 (three) times daily.    Dispense:  90 capsule    Refill:  3   lisdexamfetamine  (VYVANSE ) 50 MG capsule    Sig: Take 1 capsule (50 mg total) by mouth daily.    Dispense:  30 capsule    Refill:  0   Orders Placed This Encounter  Procedures   CBC with Differential/Platelet   Comprehensive metabolic panel with GFR   TSH   T4, free   Vitamin B12   VITAMIN D  25 Hydroxy (Vit-D Deficiency, Fractures)   Lipid panel   Testos,Total,Free and SHBG (Female)   Assessment & Plan:   Assessment and Plan Assessment & Plan     Geni Shutter, DO, MS, FAAFP, Dipl. [facility] [address] [place], [address] Dept: [phone] Dept Fax: [phone]  Subjective:   History of Present Illness   Review of Systems: Negative, with the exception of above mentioned in HPI.  History:   Reviewed by clinician on day of visit: allergies, medications, problem list, medical history, surgical history, family history, social history, and previous encounter notes.  {History (Optional):23778}  Medications:   Show/hide medication list[1] Allergies[2]  Objective:   BP 122/74 (BP Location: Left Wrist, Patient Position: Sitting, Cuff Size: Large)   Pulse (!) 122   Temp 98.4 F (36.9 C) (Oral)   Ht 5' 7 (1.702 m)   Wt (!) 390 lb 3.2 oz (177 kg)   SpO2 97%   BMI 61.11 kg/m  {Insert last BP/Wt (optional):23777}{See vitals history (optional):1}    Physical Exam  {Insert previous labs (optional):23779} {See past labs  Heme  Chem  Endocrine  Serology  Results Review  (optional):1}  Results for orders placed or performed during the hospital encounter of 10/29/23  Pregnancy, urine   Collection Time: 10/29/23  4:54 PM  Result Value Ref Range   Preg Test, Ur NEGATIVE NEGATIVE    Attestations:   {EW ATTESTATIONS:34266} {ACUTE VISIT ATTESTATION:34267} {EW MEDICARE SCREENING AND COUNSELING:34331}    [1]  Outpatient Medications Prior to Visit  Medication Sig Note   albuterol  (VENTOLIN  HFA) 108 (90 Base) MCG/ACT inhaler Inhale 2 puffs into the lungs every 6 (six) hours as needed for wheezing or shortness of breath.    ALPRAZolam  (XANAX ) 0.25 MG tablet Take 1 tablet (0.25 mg total) by mouth daily as needed for anxiety.    amphetamine -dextroamphetamine  (ADDERALL) 30 MG tablet Take 1 tablet by mouth daily.    citalopram  (CELEXA ) 40 MG tablet Take 1 tablet (40 mg total) by mouth daily.    cyclobenzaprine  (FLEXERIL ) 10 MG tablet Take 1 tablet (10 mg total) by mouth 2 (two) times daily as needed for muscle spasms.    Drospirenone ([name]) 4 MG TABS 1 tablet Orally Once a day; Duration: 84 days    Fiber Adult Gummies 2 g CHEW Chew 2 gummies Orally once a day    Multiple Vitamin (MULTI VITAMIN) TABS 1 tablet Orally Once a day    Multiple Vitamins-Minerals (EMERGEN-C VITAMIN C) CHEW Take 2 chewables by mouth once daily.    polyethylene glycol powder (MIRALAX)  17 GM/SCOOP powder 1 scoop mixed with 8 ounces of fluid Orally Once a day As needed    spironolactone  (ALDACTONE ) 50 MG tablet Take 1 tablet (50 mg total) by mouth daily.    tirzepatide  (ZEPBOUND ) 15 MG/0.5ML Pen Inject 15 mg into the skin once a week.    Vitamin D , Ergocalciferol , (DRISDOL ) 1.25 MG (50000 UNIT) CAPS capsule TAKE 1 CAPSULE BY MOUTH 1 TIME WEEKLY Orally once a week; Duration: 84 days    [DISCONTINUED] lisdexamfetamine  (VYVANSE ) 50 MG capsule Take 1 capsule (50 mg total) by mouth daily. 08/30/2024: Has not started yet due to Rx mix up insurance is only covering the Adderall for now   No  facility-administered medications prior to visit.  [2]  Allergies Allergen Reactions   Ocella [Drospirenone-Ethinyl Estradiol ] Shortness Of Breath   Adhesive [Tape] Itching   Lo Loestrin Fe [Norethin  Ace-Eth Estrad-Fe] Palpitations   Lorazepam  Other (See Comments)    makes me angry   Monistat [Tioconazole] Itching   "

## 2024-08-31 DIAGNOSIS — M5416 Radiculopathy, lumbar region: Secondary | ICD-10-CM | POA: Insufficient documentation

## 2024-08-31 LAB — CBC WITH DIFFERENTIAL/PLATELET
Basophils Absolute: 0.1 K/uL (ref 0.0–0.1)
Basophils Relative: 0.9 % (ref 0.0–3.0)
Eosinophils Absolute: 0.5 K/uL (ref 0.0–0.7)
Eosinophils Relative: 4.3 % (ref 0.0–5.0)
HCT: 38 % (ref 36.0–46.0)
Hemoglobin: 12.7 g/dL (ref 12.0–15.0)
Lymphocytes Relative: 34.4 % (ref 12.0–46.0)
Lymphs Abs: 4 K/uL (ref 0.7–4.0)
MCHC: 33.5 g/dL (ref 30.0–36.0)
MCV: 84.1 fl (ref 78.0–100.0)
Monocytes Absolute: 0.8 K/uL (ref 0.1–1.0)
Monocytes Relative: 6.7 % (ref 3.0–12.0)
Neutro Abs: 6.3 K/uL (ref 1.4–7.7)
Neutrophils Relative %: 53.7 % (ref 43.0–77.0)
Platelets: 402 K/uL — ABNORMAL HIGH (ref 150.0–400.0)
RBC: 4.51 Mil/uL (ref 3.87–5.11)
RDW: 13.4 % (ref 11.5–15.5)
WBC: 11.7 K/uL — ABNORMAL HIGH (ref 4.0–10.5)

## 2024-08-31 LAB — LIPID PANEL
Cholesterol: 177 mg/dL (ref 28–200)
HDL: 42.9 mg/dL
LDL Cholesterol: 112 mg/dL — ABNORMAL HIGH (ref 10–99)
NonHDL: 133.85
Total CHOL/HDL Ratio: 4
Triglycerides: 107 mg/dL (ref 10.0–149.0)
VLDL: 21.4 mg/dL (ref 0.0–40.0)

## 2024-08-31 LAB — COMPREHENSIVE METABOLIC PANEL WITH GFR
ALT: 15 U/L (ref 3–35)
AST: 16 U/L (ref 5–37)
Albumin: 4.1 g/dL (ref 3.5–5.2)
Alkaline Phosphatase: 57 U/L (ref 39–117)
BUN: 16 mg/dL (ref 6–23)
CO2: 27 meq/L (ref 19–32)
Calcium: 9.4 mg/dL (ref 8.4–10.5)
Chloride: 103 meq/L (ref 96–112)
Creatinine, Ser: 0.61 mg/dL (ref 0.40–1.20)
GFR: 118.65 mL/min
Glucose, Bld: 77 mg/dL (ref 70–99)
Potassium: 4.2 meq/L (ref 3.5–5.1)
Sodium: 138 meq/L (ref 135–145)
Total Bilirubin: 0.3 mg/dL (ref 0.2–1.2)
Total Protein: 7.7 g/dL (ref 6.0–8.3)

## 2024-08-31 LAB — T4, FREE: Free T4: 1.01 ng/dL (ref 0.60–1.60)

## 2024-08-31 LAB — VITAMIN D 25 HYDROXY (VIT D DEFICIENCY, FRACTURES): VITD: 13.16 ng/mL — ABNORMAL LOW (ref 30.00–100.00)

## 2024-08-31 LAB — VITAMIN B12: Vitamin B-12: 238 pg/mL (ref 211–911)

## 2024-08-31 LAB — TSH: TSH: 2.84 u[IU]/mL (ref 0.35–5.50)

## 2024-09-03 LAB — TESTOS,TOTAL,FREE AND SHBG (FEMALE)
Free Testosterone: 7.2 pg/mL — ABNORMAL HIGH (ref 0.1–6.4)
Sex Hormone Binding: 30 nmol/L (ref 17–124)
Testosterone, Total, LC-MS-MS: 42 ng/dL (ref 2–45)

## 2024-09-04 ENCOUNTER — Telehealth

## 2024-09-04 DIAGNOSIS — J019 Acute sinusitis, unspecified: Secondary | ICD-10-CM

## 2024-09-04 DIAGNOSIS — B9689 Other specified bacterial agents as the cause of diseases classified elsewhere: Secondary | ICD-10-CM

## 2024-09-05 MED ORDER — AMOXICILLIN-POT CLAVULANATE 875-125 MG PO TABS: 1.0000 | ORAL_TABLET | Freq: Two times a day (BID) | ORAL | 0 refills | Status: AC

## 2024-09-05 MED ORDER — FLUTICASONE PROPIONATE 50 MCG/ACT NA SUSP: 2.0000 | Freq: Every day | NASAL | 0 refills | Status: AC

## 2024-09-05 NOTE — Progress Notes (Signed)

## 2024-09-07 ENCOUNTER — Ambulatory Visit (INDEPENDENT_AMBULATORY_CARE_PROVIDER_SITE_OTHER): Admitting: Clinical

## 2024-09-07 DIAGNOSIS — F331 Major depressive disorder, recurrent, moderate: Secondary | ICD-10-CM

## 2024-09-07 DIAGNOSIS — F4312 Post-traumatic stress disorder, chronic: Secondary | ICD-10-CM

## 2024-09-07 NOTE — Progress Notes (Signed)
 Time: 2:03 pm -2:57 pm CPT Code: [id number]-04 Diagnosis: F33.1, F43.12  Cassandra Ibarra was seen remotely using secure video conferencing. She was in her office and therapist was in her at office time of the appointment. Client is aware of risks of telehealth and consented to a virtual visit. Cassandra Ibarra reported that she had been able to rest and recharge over the break, and had plans to speak with the principal, as well as ideas for a backup plan if her work does not shift in her favor. Session focused on next steps in her career and in her relationships. She is scheduled to be seen again in two weeks.    Treatment Plan Client Abilities/Strengths  Cassandra Ibarra described herself as open to listen to insights from others.  Client Treatment Preferences:  Cassandra Ibarra shared that she is ok with appointments during the daytime, but there is a chance she may need to change her appointments to better fit her work schedule. She prefers appointments on Mondays, Wednesdays, or Fridays.  Client Statement of Needs  Cassandra Ibarra shared that she is in need of a professional outlet for stress.  Treatment Level  She shared that she would probably benefit from biweekly sessions.   Symptoms  Problems Addressed  Cassandra Ibarra is going through a divorce that marks the end of a 10-year relationship, as well as the losses of several close family members. She described her childhood as chaotic, with ongoing stress related to her father's health concerns.  Goals 1. Cassandra Ibarra will be provided with support and, when appropriate, strategies to cope with her current situation. Objective Cassandra Ibarra will develop coping strategies and increase overall motivation.  Target Date: 11/22/2024 Frequency: Biweekly  Progress: 75 Modality: Individual   Related Interventions Cassandra Ibarra will be provided with opportunities to process her experiences in session Therapist will help Cassandra Ibarra to notice and disengage from maladaptive thoughts and behaviors using CBT-based  strategies Cassandra Ibarra will have opportunities to process experiences from her childhood in order to better understand their current impact Therapist will provide referrals for additional resources as appropriate  Diagnosis Axis none Major Depression, Recurrent, Moderate (F33.2) PTSD, Chronic    Axis none    Conditions For Discharge Achievement of treatment goals and objectives   Intake  Presenting Problem   Cassandra Ibarra presented seeking individual therapy. She shared that she has had one of the hardest years of her life. She suffered COVID in September, followed by pneumonia, followed by bronchitis. She and her wife separated but were still living together at time of intake. Since 21-Jan-2027her  grandmother died, then her father passed away three weeks later. She had not been in contact with either for mental health purposes for 10 years. Her ex-wife suffered a motorcycle accident two weeks before the initial session and Cassandra Ibarra was caring for her during her recovery. Suicidal ideation and intent were denied. Symptoms  Fatigue/lack of energy, sleep disturbances including vivid dreams, she shared a general sense of having a low battery/emotional exhaustion, difficulty connecting with emotions and a sense of pushing through History of Problem   Cassandra Ibarra suffers from polycystic ovarian syndrome. End of 2017/2018 she was taking different hormone based birth controls. 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 These lasted roughly two weeks. She reported that she woke up and cried the whole day for two weeks. She attended therapy during this time for about two months. She also began taking antidepressant medication. She also attended  therapy in 2010/09/28 following a bad breakup. She first began taking antidepressants in 09/28/16, but reported a history of taking and stopping them. She has taken Lexapro, buspar,  trazodone, and has spent the most time taking citalopram . She reported some fear of taking new medication due to her experiences with an uncle who took Wellbutrin and had a terrible suicidal ideation episode that resulted in him going missing for three days. She shared that she has similar medication reactions to her mother. She was not taking any prescription medications at time of intake. She is prescribed zetbound and Vyvanse  but has not been able to access Vyvanse  due to the shortage and has not been actively taking either. Cassandra Ibarra revealed that she had caught her father stalking her home 6 months prior to his death. Recent Trigger     Marital and Family Information  She and her wife had been married for over 8 years, and together for 10 years prior to divorcing. She shared that she had a high school relationship that ended prior to college, several relationships in college, and met her wife and married her by 36.    Present family concerns/problems: Cassandra Ibarra is very close with her mother. She has a sister who is 48 years older with whom she has a good relationship. Her father died in 09/28/2024. She has extended family  she sees on holidays. She has not communicated with anyone on her father's side for ten years except for one uncle. She described her father as an addict since before she was born. He underwent a quadruple bypass when Cassandra Ibarra was 10, and became addicted to prescription painkillers following this. She described him as manipulative and narcisstic toward people he was close with. She reported frequent drama in family relationships having to do with her father's addictions, and aspects of her childhood as chaotic. She shared that she had been daddy's little girl prior to maturing and becoming more aware of his challenges. She shared that she had only spoken with her father twice in the 10 years leading to his death. She also reported writing him a long letter that he did not respond to, which  was a hurtful experience for her. He reportedly died as a result of a heart attack three weeks after the death of Cassandra Ibarra's grandmother, who died following a fall.   Strengths/resources in the family/friends:  She shared that she participates in an active friend community online. She has met and planned trips with several people from the group. She is still close with two of her best friends from childhood. She also maintains a close relationship with her older sister and several work friends. However, she described herself as less social than pre-COVID. Since her divorce, Cassandra Ibarra has been involved in an online romantic relationship since late 2022/09/28.    Family background / ethnic factors: Glennice shared that her wife was a cisgendered female for the beginning of their relationship, but transitioned to female in 09/28/2017. She described this as very difficult for her, and that there were no warning signs. She shared that prior to the transition, her wife had been very masculine, and described the experience as being like my husband died. Looking back, she wonders if they should have ended the relationship at that time. Annisa shard that her wife has a history of hiding the truth not to hurt people, but eventually revealed that she was no longer attracted to women. She reported that this revelation had prompted her to grieve her husband and  earlier stages of the relationship, but not her wife.    No needs/concerns related to ethnicity reported when asked: No  Education/Vocation   Interpersonal concerns/problems: None. 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 Personal strengths: Lakela shared that she is continuing to function despite challenges, she actively looks for the positive. In the year since starting therapy, she has made significant strides toward establishing healthy habits, including showering regularly, initiating a skincare routine,  meal-prepping, cleaning her home, and losing 60 lbs. Military/work problems/concerns: None  Leisure Activities/Daily Functioning : no changes noted. Enjoys online social activities, including talking and gaming Legal Status  No Legal Problems: No Medical/Nutritional Concerns   Comments: polycystic ovarian syndrome, ADHD, fibromyalgia, questions whether she may be on the spectrum   Substance use/abuse/dependence: Cassandra Ibarra described herself as a social drinker, who drinks occasionally (every other week). She has previously dabbled with edibles, but found it difficult to find a good dose.    Comments:    Religion/Spirituality: Cassandra Ibarra described herself as leaning toward agnosticism. She suspects she may have religious trauma from childhood.    General Behavior: WNL Attire: WNL Gait: not observed-telehealth Motor Activity: WNL Stream of Thought - Productivity: WNL Stream of thought - Progression: WNL Stream of thought - Language:  WNL Emotional tone and reactions - Mood: WNL  Emotional tone and reactions - Affect: WNL Mental trend/Content of thoughts - Perception: WNL Mental trend/Content of thoughts - Orientation: WNL Mental trend/Content of thoughts - Memory: WNL Mental trend/Content of thoughts - General knowledge: WNL Insight: WNL Judgment: WNL Intelligence: WNL Mental Status Comment: WNL Diagnostic Summary Major Depression, Recurrent, Moderate (F33.2), Post Traumatic Stress disorder, chronic (F43.12)   Cassandra Ibarra LITTIE Ponto, PhD               Cassandra Ibarra LITTIE Ponto, PhDTime: 8:00 am -8:58 am CPT Code: 09162E-04 Diagnosis: F33.2, F43.12  Cassandra Ibarra was seen remotely using secure video conferencing. She was in her home and therapist was in her at office time of the appointment. Client is aware of risks of telehealth and consented to a virtual visit. Cassandra Ibarra reported an improvement in sleep and overall mood since her last session. She reflected upon having been able to maintain  healthy habits while grieving her cat's death, as well as her significant progress over the last year. Session also included discussion of Cat's exploration of her identify and self-expression since her divorce.  She is scheduled to be seen again in one week.     Treatment Plan Client Abilities/Strengths  Sheera described herself as open to listen to insights from others.  Client Treatment Preferences:  Oanh shared that she is ok with appointments during the daytime, but there is a chance she may need to change her appointments to better fit her work schedule. She prefers appointments on Mondays, Wednesdays, or Fridays.  Client Statement of Needs  Naysha shared that she is in need of a professional outlet for stress.  Treatment Level  She shared that she would probably benefit from biweekly sessions.   Symptoms  Problems Addressed  Gillian is going through a divorce that marks the end of a 10-year relationship, as well as the losses of several close family members. She described her childhood as chaotic, with ongoing stress related to her father's health concerns.  Goals 1. Doyne will be provided with support and, when appropriate, strategies to cope with her current situation. Objective Kerrigan will develop coping strategies and increase  overall motivation.  Target Date: 11/22/2024 Frequency: Biweekly  Progress: 75 Modality: Individual   Related Interventions Maelani will be provided with opportunities to process her experiences in session Therapist will help Auria to notice and disengage from maladaptive thoughts and behaviors using CBT-based strategies Cece will have opportunities to process experiences from her childhood in order to better understand their current impact Therapist will provide referrals for additional resources as appropriate  Diagnosis Axis none Major Depression, Recurrent, Moderate (F33.2) PTSD, Chronic    Axis none    Conditions For  Discharge Achievement of treatment goals and objectives   Intake  Presenting Problem   Cassandra Ibarra presented seeking individual therapy. She shared that she has had one of the hardest years of her life. She suffered COVID in September, followed by pneumonia, followed by bronchitis. She and her wife separated but were still living together at time of intake. Since 01-16-2027her  grandmother died, then her father passed away three weeks later. She had not been in contact with either for mental health purposes for 10 years. Her ex-wife suffered a motorcycle accident two weeks before the initial session and Cassandra Ibarra was caring for her during her recovery. Suicidal ideation and intent were denied. Symptoms  Fatigue/lack of energy, sleep disturbances including vivid dreams, she shared a general sense of having a low battery/emotional exhaustion, difficulty connecting with emotions and a sense of pushing through History of Problem   Cassandra Ibarra suffers from polycystic ovarian syndrome. End of 2017/2018 she was taking different hormone based birth controls. 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 These lasted roughly two weeks. She reported that she woke up and cried the whole day for two weeks. She attended therapy during this time for about two months. She also began taking antidepressant medication. She also attended therapy in 27-Sep-2010 following a bad breakup. She first began taking antidepressants in 2016-09-27, but reported a history of taking and stopping them. She has taken Lexapro, buspar, trazodone, and has spent the most time taking citalopram . She reported some fear of taking new medication due to her experiences with an uncle who took Wellbutrin and had a terrible suicidal ideation episode that resulted in him going missing for three days. She shared that she has similar medication reactions to her mother.  She was not taking any prescription medications at time of intake. She is prescribed zetbound and Vyvanse  but has not been able to access Vyvanse  due to the shortage and has not been actively taking either. Cassandra Ibarra revealed that she had caught her father stalking her home 6 months prior to his death. Recent Trigger     Marital and Family Information  She and her wife had been married for over 8 years, and together for 10 years prior to divorcing. She shared that she had a high school relationship that ended prior to college, several relationships in college, and met her wife and married her by 76.    Present family concerns/problems: Cassandra Ibarra is very close with her mother. She has a sister who is 63 years older with whom she has a good relationship. Her father died in 27-Sep-2024. She has extended family  she sees on holidays. She has not communicated with anyone on her father's side for ten years except for one uncle. She described her father as an addict since before she was born. He underwent a quadruple bypass when Cassandra Ibarra was 10, and became addicted  to prescription painkillers following this. She described him as manipulative and narcisstic toward people he was close with. She reported frequent drama in family relationships having to do with her father's addictions, and aspects of her childhood as chaotic. She shared that she had been daddy's little girl prior to maturing and becoming more aware of his challenges. She shared that she had only spoken with her father twice in the 10 years leading to his death. She also reported writing him a long letter that he did not respond to, which was a hurtful experience for her. He reportedly died as a result of a heart attack three weeks after the death of Chrisha's grandmother, who died following a fall.   Strengths/resources in the family/friends:  She shared that she participates in an active friend community online. She has met and planned trips with several  people from the group. She is still close with two of her best friends from childhood. She also maintains a close relationship with her older sister and several work friends. However, she described herself as less social than pre-COVID. Since her divorce, Cassandra Ibarra has been involved in an online romantic relationship since late 2024.    Family background / ethnic factors: Zaira shared that her wife was a cisgendered female for the beginning of their relationship, but transitioned to female in 2019. She described this as very difficult for her, and that there were no warning signs. She shared that prior to the transition, her wife had been very masculine, and described the experience as being like my husband died. Looking back, she wonders if they should have ended the relationship at that time. Ligia shard that her wife has a history of hiding the truth not to hurt people, but eventually revealed that she was no longer attracted to women. She reported that this revelation had prompted her to grieve her husband and earlier stages of the relationship, but not her wife.    No needs/concerns related to ethnicity reported when asked: No  Education/Vocation   Interpersonal concerns/problems: None. 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 Personal strengths: Cassandra Ibarra shared that she is continuing to function despite challenges, she actively looks for the positive. In the year since starting therapy, she has made significant strides toward establishing healthy habits, including showering regularly, initiating a skincare routine, meal-prepping, cleaning her home, and losing 60 lbs. Military/work problems/concerns: None  Leisure Activities/Daily Functioning : no changes noted. Enjoys online social activities, including talking and gaming Legal Status  No Legal Problems: No Medical/Nutritional Concerns   Comments: polycystic ovarian syndrome, ADHD,  fibromyalgia, questions whether she may be on the spectrum   Substance use/abuse/dependence: Taleen described herself as a social drinker, who drinks occasionally (every other week). She has previously dabbled with edibles, but found it difficult to find a good dose.    Comments:    Religion/Spirituality: Zailynn described herself as leaning toward agnosticism. She suspects she may have religious trauma from childhood.    General Behavior: WNL Attire: WNL Gait: not observed-telehealth Motor Activity: WNL Stream of Thought - Productivity: WNL Stream of thought - Progression: WNL Stream of thought - Language:  WNL Emotional tone and reactions - Mood: WNL  Emotional tone and reactions - Affect: WNL Mental trend/Content of thoughts - Perception: WNL Mental trend/Content of thoughts - Orientation: WNL Mental trend/Content of thoughts - Memory: WNL Mental trend/Content of thoughts - General knowledge: WNL Insight: WNL Judgment: WNL Intelligence:  WNL Mental Status Comment: WNL Diagnostic Summary Major Depression, Recurrent, Moderate (F33.2), Post Traumatic Stress disorder, chronic (F43.12)     Cassandra Ibarra LITTIE Ponto, PhD               Cassandra Ibarra LITTIE Ponto, PhD               Cassandra Ibarra LITTIE Ponto, PhD

## 2024-09-09 DIAGNOSIS — M5416 Radiculopathy, lumbar region: Secondary | ICD-10-CM

## 2024-09-12 MED ORDER — SLYND 4 MG PO TABS: 4.0000 mg | ORAL_TABLET | Freq: Every day | ORAL | 0 refills | Status: AC

## 2024-09-12 MED ORDER — GABAPENTIN 100 MG PO CAPS: 100.0000 mg | ORAL_CAPSULE | Freq: Three times a day (TID) | ORAL | 3 refills | Status: AC

## 2024-09-12 NOTE — Telephone Encounter (Signed)
 I have pts [name]  and Gabapentin  rending to be sent after your approval. I will also be sending this to the PA team to work on pts PA for her Vyvanse . Please see the bottom portion of pts message for further questions regarding lab results.

## 2024-09-12 NOTE — Telephone Encounter (Signed)
 PA team: pt reports that she needs a PA completed on her Vyvanse . Thank you

## 2024-09-16 ENCOUNTER — Ambulatory Visit: Payer: Self-pay | Admitting: Family Medicine

## 2024-09-19 ENCOUNTER — Ambulatory Visit: Payer: Self-pay | Admitting: Family Medicine

## 2024-09-19 DIAGNOSIS — N760 Acute vaginitis: Secondary | ICD-10-CM

## 2024-09-19 MED ORDER — FLUCONAZOLE 150 MG PO TABS: ORAL_TABLET | ORAL | 0 refills | Status: AC

## 2024-09-19 NOTE — Telephone Encounter (Signed)
 FYI Only or Action Required?: Action required by provider: clinical question for provider and update on patient condition.  Patient was last seen in primary care on 08/30/2024 by Prentiss Frieze, DO.  Called Nurse Triage reporting Vaginitis.  Symptoms began several days ago.  Interventions attempted: Nothing.  Symptoms are: unchanged.  Triage Disposition: See PCP When Office is Open (Within 3 Days)  Patient/caregiver understands and will follow disposition?: Yes    Message from Brittany M sent at 09/19/2024 10:28 AM EST  Reason for Triage: Patient has a yeast infection- telehealth appt over weekend. Patient is allergic to vaginal cream and is currently taking citalopram  (CELEXA ) 40 MG tablet and they said that another medication would interact with that- she wants to take a pill to help with the yeast infection      Reason for Disposition  [1] Symptoms of a yeast infection (i.e., itchy, white discharge, not bad smelling) AND [2] not improved > 3 days following Care Advice  Answer Assessment - Initial Assessment Questions Pt states she had a virtual telehealth appt over the weekend d/t symptoms starting Friday. She states that provider sent in monostat which she has listed in her chart as a sensitivity/allergen. Pt is not currently doing any intervention other than hygiene at this time. Pt denies any pain but itching 8/10 at this time. Pt is requesting PO rx to be sent to [facility] [address]; updated in preferred pharmacy list.      1. SYMPTOM: What's the main symptom you're concerned about? (e.g., pain, itching, dryness)     Itching  2. LOCATION: Where is the  symptoms located? (e.g., inside/outside, left/right)     Inside; bilateral   3. ONSET: When did the  symptoms  start?     Friday, 01/16  4. PAIN: Is there any pain? If Yes, ask: How bad is it? (Scale: 1-10; mild, moderate, severe)     No   5. ITCHING: Is there any itching? If Yes, ask: How bad is it?  (Scale: 1-10; mild, moderate, severe)     Yes; 8/10  6. CAUSE: What do you think is causing the discharge? Have you had the same problem before? What happened then?     Yeast infection   7. OTHER SYMPTOMS: Do you have any other symptoms? (e.g., fever, itching, vaginal bleeding, pain with urination, injury to genital area, vaginal foreign body)     No  Protocols used: Vaginal Symptoms-A-AH

## 2024-09-19 NOTE — Telephone Encounter (Signed)
 I have spoken with Dr Prentiss and notified her of her pts current symptoms. She has given the order for treatment. I will send to pts pharmacy and notify pt as well.   ** Pt is currently on Citalopram . Dr Prentiss has given approval to send in the fluconazole . Pt has been notified.   Medication sent

## 2024-09-19 NOTE — Telephone Encounter (Signed)
 I have called and spoken with pt. She saw [employer] on Friday and based on her symptoms was diagnosed with Vaginitis and was prescribed Monistat. She told the provider on the visit that she can't use Monistat, that she has a reaction to it. They responded with well that's all we can give you. Pt is wondering if we can sent her in treatment in an oral pill form ASAP. Thank you

## 2024-09-21 ENCOUNTER — Ambulatory Visit: Admitting: Clinical

## 2024-09-21 ENCOUNTER — Other Ambulatory Visit (HOSPITAL_COMMUNITY): Payer: Self-pay

## 2024-09-21 DIAGNOSIS — F4312 Post-traumatic stress disorder, chronic: Secondary | ICD-10-CM

## 2024-09-21 DIAGNOSIS — F331 Major depressive disorder, recurrent, moderate: Secondary | ICD-10-CM

## 2024-09-21 NOTE — Telephone Encounter (Addendum)
 Pharmacy Patient Advocate Encounter   Received notification from Physician's Office that prior authorization for VYVANSE  is required/requested.   Insurance verification completed.   The patient is insured through [facility].  The patient's drug benefit plan provides coverage for other drugs which may be considered for treating your patient. Can your patient be treated with a formulary drug? Available Formulary Alternatives: amphetamine -dextroamphetamine  mixed salts ext-rel, dexmethylphenidate ext-rel, lisdexamfetamine , methylphenidate ext-rel, AZSTARYS [If yes, provide your patient with a new prescription for the formulary product.] Please advise.

## 2024-09-21 NOTE — Progress Notes (Signed)
 Time: 2:03 pm -2:57 pm CPT Code: [id number]-04 Diagnosis: F33.1, F43.12  Cassandra Ibarra was seen remotely using secure video conferencing. She was in her office and therapist was in her at office time of the appointment. Client is aware of risks of telehealth and consented to a virtual visit. Cassandra Ibarra had completed her homework of speaking with her principal about her workload. She reported that the conversation had gone very well, in that the principal was receptive and had genuinely been unaware. Cassandra Ibarra reported an immediate sense of relief and reduction in stress as a result. She had also spoken with her significant other about the state of the relationship, and reported a similarly productive conversation, contributing to an overall reduction in stress. She is scheduled to be seen again in two weeks.    Treatment Plan Client Abilities/Strengths  Cassandra Ibarra described herself as open to listen to insights from others.  Client Treatment Preferences:  Cassandra Ibarra shared that she is ok with appointments during the daytime, but there is a chance she may need to change her appointments to better fit her work schedule. She prefers appointments on Mondays, Wednesdays, or Fridays.  Client Statement of Needs  Cassandra Ibarra shared that she is in need of a professional outlet for stress.  Treatment Level  She shared that she would probably benefit from biweekly sessions.   Symptoms  Problems Addressed  Cassandra Ibarra is going through a divorce that marks the end of a 10-year relationship, as well as the losses of several close family members. She described her childhood as chaotic, with ongoing stress related to her father's health concerns.  Goals 1. Cassandra Ibarra will be provided with support and, when appropriate, strategies to cope with her current situation. Objective Cassandra Ibarra will develop coping strategies and increase overall motivation.  Target Date: 11/22/2024 Frequency: Biweekly  Progress: 75 Modality: Individual   Related  Interventions Cassandra Ibarra will be provided with opportunities to process her experiences in session Therapist will help Cassandra Ibarra to notice and disengage from maladaptive thoughts and behaviors using CBT-based strategies Cassandra Ibarra will have opportunities to process experiences from her childhood in order to better understand their current impact Therapist will provide referrals for additional resources as appropriate  Diagnosis Axis none Major Depression, Recurrent, Moderate (F33.2) PTSD, Chronic    Axis none    Conditions For Discharge Achievement of treatment goals and objectives   Intake  Presenting Problem   Cassandra Ibarra presented seeking individual therapy. She shared that she has had one of the hardest years of her life. She suffered COVID in September, followed by pneumonia, followed by bronchitis. She and her wife separated but were still living together at time of intake. Since 01-02-2027her  grandmother died, then her father passed away three weeks later. She had not been in contact with either for mental health purposes for 10 years. Her ex-wife suffered a motorcycle accident two weeks before the initial session and Cassandra Ibarra was caring for her during her recovery. Suicidal ideation and intent were denied. Symptoms  Fatigue/lack of energy, sleep disturbances including vivid dreams, she shared a general sense of having a low battery/emotional exhaustion, difficulty connecting with emotions and a sense of pushing through History of Problem   Cassandra Ibarra suffers from polycystic ovarian syndrome. End of 2017/2018 she was taking different hormone based birth controls. 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 These lasted roughly two weeks. She reported that she woke up and cried the whole  day for two weeks. She attended therapy during this time for about two months. She also began taking  antidepressant medication. She also attended therapy in 2012 following a bad breakup. She first began taking antidepressants in 2018, but reported a history of taking and stopping them. She has taken Lexapro, buspar, trazodone, and has spent the most time taking citalopram . She reported some fear of taking new medication due to her experiences with an uncle who took Wellbutrin and had a terrible suicidal ideation episode that resulted in him going missing for three days. She shared that she has similar medication reactions to her mother. She was not taking any prescription medications at time of intake. She is prescribed zetbound and Vyvanse  but has not been able to access Vyvanse  due to the shortage and has not been actively taking either. Cassandra Ibarra revealed that she had caught her father stalking her home 6 months prior to his death. Recent Trigger     Marital and Family Information  She and her wife had been married for over 8 years, and together for 10 years prior to divorcing. She shared that she had a high school relationship that ended prior to college, several relationships in college, and met her wife and married her by 42.    Present family concerns/problems: Cassandra Ibarra is very close with her mother. She has a sister who is 39 years older with whom she has a good relationship. Her father died in September 18, 2024. She has extended family  she sees on holidays. She has not communicated with anyone on her father's side for ten years except for one uncle. She described her father as an addict since before she was born. He underwent a quadruple bypass when Cassandra Ibarra was 10, and became addicted to prescription painkillers following this. She described him as manipulative and narcisstic toward people he was close with. She reported frequent drama in family relationships having to do with her father's addictions, and aspects of her childhood as chaotic. She shared that she had been daddy's little girl prior to  maturing and becoming more aware of his challenges. She shared that she had only spoken with her father twice in the 10 years leading to his death. She also reported writing him a long letter that he did not respond to, which was a hurtful experience for her. He reportedly died as a result of a heart attack three weeks after the death of Marysa's grandmother, who died following a fall.   Strengths/resources in the family/friends:  She shared that she participates in an active friend community online. She has met and planned trips with several people from the group. She is still close with two of her best friends from childhood. She also maintains a close relationship with her older sister and several work friends. However, she described herself as less social than pre-COVID. Since her divorce, Cassandra Ibarra has been involved in an online romantic relationship since late 2024.    Family background / ethnic factors: Nida shared that her wife was a cisgendered female for the beginning of their relationship, but transitioned to female in 2019. She described this as very difficult for her, and that there were no warning signs. She shared that prior to the transition, her wife had been very masculine, and described the experience as being like my husband died. Looking back, she wonders if they should have ended the relationship at that time. Brooklee shard that her wife has a history of hiding the truth not to hurt people, but  eventually revealed that she was no longer attracted to women. She reported that this revelation had prompted her to grieve her husband and earlier stages of the relationship, but not her wife.    No needs/concerns related to ethnicity reported when asked: No  Education/Vocation   Interpersonal concerns/problems: None. 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 Personal strengths: Shoua shared that she is continuing to function  despite challenges, she actively looks for the positive. In the year since starting therapy, she has made significant strides toward establishing healthy habits, including showering regularly, initiating a skincare routine, meal-prepping, cleaning her home, and losing 60 lbs. Military/work problems/concerns: None  Leisure Activities/Daily Functioning : no changes noted. Enjoys online social activities, including talking and gaming Legal Status  No Legal Problems: No Medical/Nutritional Concerns   Comments: polycystic ovarian syndrome, ADHD, fibromyalgia, questions whether she may be on the spectrum   Substance use/abuse/dependence: Rosealyn described herself as a social drinker, who drinks occasionally (every other week). She has previously dabbled with edibles, but found it difficult to find a good dose.    Comments:    Religion/Spirituality: Jordan described herself as leaning toward agnosticism. She suspects she may have religious trauma from childhood.    General Behavior: WNL Attire: WNL Gait: not observed-telehealth Motor Activity: WNL Stream of Thought - Productivity: WNL Stream of thought - Progression: WNL Stream of thought - Language:  WNL Emotional tone and reactions - Mood: WNL  Emotional tone and reactions - Affect: WNL Mental trend/Content of thoughts - Perception: WNL Mental trend/Content of thoughts - Orientation: WNL Mental trend/Content of thoughts - Memory: WNL Mental trend/Content of thoughts - General knowledge: WNL Insight: WNL Judgment: WNL Intelligence: WNL Mental Status Comment: WNL Diagnostic Summary Major Depression, Recurrent, Moderate (F33.2), Post Traumatic Stress disorder, chronic (F43.12)   Andriette LITTIE Ponto, PhD               Andriette LITTIE Ponto, PhDTime: 8:00 am -8:58 am CPT Code: 09162E-04 Diagnosis: F33.2, F43.12  Cassandra Ibarra was seen remotely using secure video conferencing. She was in her home and therapist was in her at office time  of the appointment. Client is aware of risks of telehealth and consented to a virtual visit. Cassandra Ibarra reported an improvement in sleep and overall mood since her last session. She reflected upon having been able to maintain healthy habits while grieving her cat's death, as well as her significant progress over the last year. Session also included discussion of Cat's exploration of her identify and self-expression since her divorce.  She is scheduled to be seen again in one week.     Treatment Plan Client Abilities/Strengths  Cassandra Ibarra described herself as open to listen to insights from others.  Client Treatment Preferences:  Cassandra Ibarra shared that she is ok with appointments during the daytime, but there is a chance she may need to change her appointments to better fit her work schedule. She prefers appointments on Mondays, Wednesdays, or Fridays.  Client Statement of Needs  Cassandra Ibarra shared that she is in need of a professional outlet for stress.  Treatment Level  She shared that she would probably benefit from biweekly sessions.   Symptoms  Problems Addressed  Mattalyn is going through a divorce that marks the end of a 10-year relationship, as well as the losses of several close family members. She described her childhood as chaotic, with ongoing stress related to her father's health concerns.  Goals 1. Cassandra Ibarra  will be provided with support and, when appropriate, strategies to cope with her current situation. Objective Cassandra Ibarra will develop coping strategies and increase overall motivation.  Target Date: 11/22/2024 Frequency: Biweekly  Progress: 75 Modality: Individual   Related Interventions Cassandra Ibarra will be provided with opportunities to process her experiences in session Therapist will help Cassandra Ibarra to notice and disengage from maladaptive thoughts and behaviors using CBT-based strategies Cassandra Ibarra will have opportunities to process experiences from her childhood in order to better understand their  current impact Therapist will provide referrals for additional resources as appropriate  Diagnosis Axis none Major Depression, Recurrent, Moderate (F33.2) PTSD, Chronic    Axis none    Conditions For Discharge Achievement of treatment goals and objectives   Intake  Presenting Problem   Cassandra Ibarra presented seeking individual therapy. She shared that she has had one of the hardest years of her life. She suffered COVID in September, followed by pneumonia, followed by bronchitis. She and her wife separated but were still living together at time of intake. Since 01-14-2027her  grandmother died, then her father passed away three weeks later. She had not been in contact with either for mental health purposes for 10 years. Her ex-wife suffered a motorcycle accident two weeks before the initial session and Cassandra Ibarra was caring for her during her recovery. Suicidal ideation and intent were denied. Symptoms  Fatigue/lack of energy, sleep disturbances including vivid dreams, she shared a general sense of having a low battery/emotional exhaustion, difficulty connecting with emotions and a sense of pushing through History of Problem   Cynthea suffers from polycystic ovarian syndrome. End of 2017/2018 she was taking different hormone based birth controls. 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 These lasted roughly two weeks. She reported that she woke up and cried the whole day for two weeks. She attended therapy during this time for about two months. She also began taking antidepressant medication. She also attended therapy in 09-Oct-2010 following a bad breakup. She first began taking antidepressants in October 09, 2016, but reported a history of taking and stopping them. She has taken Lexapro, buspar, trazodone, and has spent the most time taking citalopram . She reported some fear of taking new medication due to her  experiences with an uncle who took Wellbutrin and had a terrible suicidal ideation episode that resulted in him going missing for three days. She shared that she has similar medication reactions to her mother. She was not taking any prescription medications at time of intake. She is prescribed zetbound and Vyvanse  but has not been able to access Vyvanse  due to the shortage and has not been actively taking either. Carl revealed that she had caught her father stalking her home 6 months prior to his death. Recent Trigger     Marital and Family Information  She and her wife had been married for over 8 years, and together for 10 years prior to divorcing. She shared that she had a high school relationship that ended prior to college, several relationships in college, and met her wife and married her by 20.    Present family concerns/problems: Madoline is very close with her mother. She has a sister who is 2 years older with whom she has a good relationship. Her father died in 10-09-2024. She has extended family  she sees on holidays. She has not communicated with anyone on her father's side for ten years except for one uncle. She  described her father as an addict since before she was born. He underwent a quadruple bypass when Nashika was 10, and became addicted to prescription painkillers following this. She described him as manipulative and narcisstic toward people he was close with. She reported frequent drama in family relationships having to do with her father's addictions, and aspects of her childhood as chaotic. She shared that she had been daddy's little girl prior to maturing and becoming more aware of his challenges. She shared that she had only spoken with her father twice in the 10 years leading to his death. She also reported writing him a long letter that he did not respond to, which was a hurtful experience for her. He reportedly died as a result of a heart attack three weeks after the death of  Taleigha's grandmother, who died following a fall.   Strengths/resources in the family/friends:  She shared that she participates in an active friend community online. She has met and planned trips with several people from the group. She is still close with two of her best friends from childhood. She also maintains a close relationship with her older sister and several work friends. However, she described herself as less social than pre-COVID. Since her divorce, Cassandra Ibarra has been involved in an online romantic relationship since late 2024.    Family background / ethnic factors: Gwendolen shared that her wife was a cisgendered female for the beginning of their relationship, but transitioned to female in 2019. She described this as very difficult for her, and that there were no warning signs. She shared that prior to the transition, her wife had been very masculine, and described the experience as being like my husband died. Looking back, she wonders if they should have ended the relationship at that time. Jacqueline shard that her wife has a history of hiding the truth not to hurt people, but eventually revealed that she was no longer attracted to women. She reported that this revelation had prompted her to grieve her husband and earlier stages of the relationship, but not her wife.    No needs/concerns related to ethnicity reported when asked: No  Education/Vocation   Interpersonal concerns/problems: None. 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 Personal strengths: Lynnie shared that she is continuing to function despite challenges, she actively looks for the positive. In the year since starting therapy, she has made significant strides toward establishing healthy habits, including showering regularly, initiating a skincare routine, meal-prepping, cleaning her home, and losing 60 lbs. Military/work problems/concerns: None  Leisure Activities/Daily  Functioning : no changes noted. Enjoys online social activities, including talking and gaming Legal Status  No Legal Problems: No Medical/Nutritional Concerns   Comments: polycystic ovarian syndrome, ADHD, fibromyalgia, questions whether she may be on the spectrum   Substance use/abuse/dependence: Mayrin described herself as a social drinker, who drinks occasionally (every other week). She has previously dabbled with edibles, but found it difficult to find a good dose.    Comments:    Religion/Spirituality: Murphy described herself as leaning toward agnosticism. She suspects she may have religious trauma from childhood.    General Behavior: WNL Attire: WNL Gait: not observed-telehealth Motor Activity: WNL Stream of Thought - Productivity: WNL Stream of thought - Progression: WNL Stream of thought - Language:  WNL Emotional tone and reactions - Mood: WNL  Emotional tone and reactions - Affect: WNL Mental trend/Content of thoughts - Perception: WNL Mental trend/Content of thoughts -  Orientation: WNL Mental trend/Content of thoughts - Memory: WNL Mental trend/Content of thoughts - General knowledge: WNL Insight: WNL Judgment: WNL Intelligence: WNL Mental Status Comment: WNL Diagnostic Summary Major Depression, Recurrent, Moderate (F33.2), Post Traumatic Stress disorder, chronic (F43.12)    Andriette LITTIE Ponto, PhD

## 2024-09-26 ENCOUNTER — Ambulatory Visit: Admitting: Family Medicine

## 2024-10-03 ENCOUNTER — Ambulatory Visit: Admitting: Family Medicine

## 2024-10-05 ENCOUNTER — Ambulatory Visit: Admitting: Clinical

## 2024-10-05 DIAGNOSIS — F4312 Post-traumatic stress disorder, chronic: Secondary | ICD-10-CM

## 2024-10-05 DIAGNOSIS — F331 Major depressive disorder, recurrent, moderate: Secondary | ICD-10-CM | POA: Diagnosis not present

## 2024-10-05 NOTE — Progress Notes (Signed)
 Time: 10:01 am -10:47 am CPT Code: [id number]-04 Diagnosis: F33.1, F43.12  Cassandra Ibarra was seen remotely using secure video conferencing. She was in her office and therapist was in her at office time of the appointment. Client is aware of risks of telehealth and consented to a virtual visit. Cassandra Ibarra reported upon continued work-related stressors. Session including discussion of and planning for an upcoming feedback appointment with one of her mudlogger. Therapist engaged her in consideration of options and suggested communication strategies. She is scheduled to be seen again in two weeks.    Treatment Plan Client Abilities/Strengths  [name] described herself as open to listen to insights from others.  Client Treatment Preferences:  [name] shared that she is ok with appointments during the daytime, but there is a chance she may need to change her appointments to better fit her work schedule. She prefers appointments on Mondays, Wednesdays, or Fridays.  Client Statement of Needs  [name] shared that she is in need of a professional outlet for stress.  Treatment Level  She shared that she would probably benefit from biweekly sessions.   Symptoms  Problems Addressed  [name] is going through a divorce that marks the end of a 10-year relationship, as well as the losses of several close family members. She described her childhood as chaotic, with ongoing stress related to her father's health concerns.  Goals 1. [name] will be provided with support and, when appropriate, strategies to cope with her current situation. Objective [name] will develop coping strategies and increase overall motivation.  Target Date: 11/22/2024 Frequency: Biweekly  Progress: 75 Modality: Individual   Related Interventions [name] will be provided with opportunities to process her experiences in session Therapist will help [name] to notice and disengage from maladaptive thoughts and behaviors using CBT-based  strategies [name] will have opportunities to process experiences from her childhood in order to better understand their current impact Therapist will provide referrals for additional resources as appropriate  Diagnosis Axis none Major Depression, Recurrent, Moderate (F33.2) PTSD, Chronic    Axis none    Conditions For Discharge Achievement of treatment goals and objectives   Intake  Presenting Problem   [name] presented seeking individual therapy. She shared that she has had one of the hardest years of her life. She suffered COVID in September, followed by pneumonia, followed by bronchitis. She and her wife separated but were still living together at time of intake. Since 27-Jan-2027her  grandmother died, then her father passed away three weeks later. She had not been in contact with either for mental health purposes for 10 years. Her ex-wife suffered a motorcycle accident two weeks before the initial session and Yarisbel was caring for her during her recovery. Suicidal ideation and intent were denied. Symptoms  Fatigue/lack of energy, sleep disturbances including vivid dreams, she shared a general sense of having a low battery/emotional exhaustion, difficulty connecting with emotions and a sense of pushing through History of Problem   [name] suffers from polycystic ovarian syndrome. End of 2017/2018 she was taking different hormone based birth controls. 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 These lasted roughly two weeks. She reported that she woke up and cried the whole day for two weeks. She attended therapy during this time for about two months. She also began taking antidepressant medication. She also attended therapy in 2012 following a bad breakup. She first began taking antidepressants in 2018, but reported a  history of taking and stopping them. She has taken Lexapro, buspar,  trazodone, and has spent the most time taking citalopram . She reported some fear of taking new medication due to her experiences with an uncle who took Wellbutrin and had a terrible suicidal ideation episode that resulted in him going missing for three days. She shared that she has similar medication reactions to her mother. She was not taking any prescription medications at time of intake. She is prescribed zetbound and Vyvanse  but has not been able to access Vyvanse  due to the shortage and has not been actively taking either. Delories revealed that she had caught her father stalking her home 6 months prior to his death. Recent Trigger     Marital and Family Information  She and her wife had been married for over 8 years, and together for 10 years prior to divorcing. She shared that she had a high school relationship that ended prior to college, several relationships in college, and met her wife and married her by 59.    Present family concerns/problems: [name] is very close with her mother. She has a sister who is 61 years older with whom she has a good relationship. Her father died in 09-10-2024. She has extended family  she sees on holidays. She has not communicated with anyone on her father's side for ten years except for one uncle. She described her father as an addict since before she was born. He underwent a quadruple bypass when [name] was 10, and became addicted to prescription painkillers following this. She described him as manipulative and narcisstic toward people he was close with. She reported frequent drama in family relationships having to do with her father's addictions, and aspects of her childhood as chaotic. She shared that she had been daddy's little girl prior to maturing and becoming more aware of his challenges. She shared that she had only spoken with her father twice in the 10 years leading to his death. She also reported writing him a long letter that he did not respond to, which  was a hurtful experience for her. He reportedly died as a result of a heart attack three weeks after the death of [name]'s grandmother, who died following a fall.   Strengths/resources in the family/friends:  She shared that she participates in an active friend community online. She has met and planned trips with several people from the group. She is still close with two of her best friends from childhood. She also maintains a close relationship with her older sister and several work friends. However, she described herself as less social than pre-COVID. Since her divorce, Cassandra Ibarra has been involved in an online romantic relationship since late 2024.    Family background / ethnic factors: Itzelle shared that her wife was a cisgendered female for the beginning of their relationship, but transitioned to female in 2019. She described this as very difficult for her, and that there were no warning signs. She shared that prior to the transition, her wife had been very masculine, and described the experience as being like my husband died. Looking back, she wonders if they should have ended the relationship at that time. Amaia shard that her wife has a history of hiding the truth not to hurt people, but eventually revealed that she was no longer attracted to women. She reported that this revelation had prompted her to grieve her husband and earlier stages of the relationship, but not her wife.    No needs/concerns related to ethnicity  reported when asked: No  Education/Vocation   Interpersonal concerns/problems: None. 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 Personal strengths: Reya shared that she is continuing to function despite challenges, she actively looks for the positive. In the year since starting therapy, she has made significant strides toward establishing healthy habits, including showering regularly, initiating a skincare routine,  meal-prepping, cleaning her home, and losing 60 lbs. Military/work problems/concerns: None  Leisure Activities/Daily Functioning : no changes noted. Enjoys online social activities, including talking and gaming Legal Status  No Legal Problems: No Medical/Nutritional Concerns   Comments: polycystic ovarian syndrome, ADHD, fibromyalgia, questions whether she may be on the spectrum   Substance use/abuse/dependence: Dorethy described herself as a social drinker, who drinks occasionally (every other week). She has previously dabbled with edibles, but found it difficult to find a good dose.    Comments:    Religion/Spirituality: Gilberta described herself as leaning toward agnosticism. She suspects she may have religious trauma from childhood.    General Behavior: WNL Attire: WNL Gait: not observed-telehealth Motor Activity: WNL Stream of Thought - Productivity: WNL Stream of thought - Progression: WNL Stream of thought - Language:  WNL Emotional tone and reactions - Mood: WNL  Emotional tone and reactions - Affect: WNL Mental trend/Content of thoughts - Perception: WNL Mental trend/Content of thoughts - Orientation: WNL Mental trend/Content of thoughts - Memory: WNL Mental trend/Content of thoughts - General knowledge: WNL Insight: WNL Judgment: WNL Intelligence: WNL Mental Status Comment: WNL Diagnostic Summary Major Depression, Recurrent, Moderate (F33.2), Post Traumatic Stress disorder, chronic (F43.12)   Andriette LITTIE Ponto, PhD               Andriette LITTIE Ponto, PhDTime: 8:00 am -8:58 am CPT Code: [id number]-04 Diagnosis: F33.2, F43.12  Cassandra Ibarra was seen remotely using secure video conferencing. She was in her home and therapist was in her at office time of the appointment. Client is aware of risks of telehealth and consented to a virtual visit. Cassandra Ibarra reported an improvement in sleep and overall mood since her last session. She reflected upon having been able to maintain  healthy habits while grieving her cat's death, as well as her significant progress over the last year. Session also included discussion of Cat's exploration of her identify and self-expression since her divorce.  She is scheduled to be seen again in one week.     Treatment Plan Client Abilities/Strengths  Danial described herself as open to listen to insights from others.  Client Treatment Preferences:  Leily shared that she is ok with appointments during the daytime, but there is a chance she may need to change her appointments to better fit her work schedule. She prefers appointments on Mondays, Wednesdays, or Fridays.  Client Statement of Needs  Dama shared that she is in need of a professional outlet for stress.  Treatment Level  She shared that she would probably benefit from biweekly sessions.   Symptoms  Problems Addressed  Marijayne is going through a divorce that marks the end of a 10-year relationship, as well as the losses of several close family members. She described her childhood as chaotic, with ongoing stress related to her father's health concerns.  Goals 1. Yamilex will be provided with support and, when appropriate, strategies to cope with her current situation. Objective Kasie will develop coping strategies and increase overall motivation.  Target Date: 11/22/2024 Frequency: Biweekly  Progress: 75 Modality: Individual   Related Interventions  Jakaila will be provided with opportunities to process her experiences in session Therapist will help Mekiah to notice and disengage from maladaptive thoughts and behaviors using CBT-based strategies TRUE will have opportunities to process experiences from her childhood in order to better understand their current impact Therapist will provide referrals for additional resources as appropriate  Diagnosis Axis none Major Depression, Recurrent, Moderate (F33.2) PTSD, Chronic    Axis none    Conditions For  Discharge Achievement of treatment goals and objectives   Intake  Presenting Problem   Cassandra Ibarra presented seeking individual therapy. She shared that she has had one of the hardest years of her life. She suffered COVID in September, followed by pneumonia, followed by bronchitis. She and her wife separated but were still living together at time of intake. Since 01/09/27her  grandmother died, then her father passed away three weeks later. She had not been in contact with either for mental health purposes for 10 years. Her ex-wife suffered a motorcycle accident two weeks before the initial session and Nerida was caring for her during her recovery. Suicidal ideation and intent were denied. Symptoms  Fatigue/lack of energy, sleep disturbances including vivid dreams, she shared a general sense of having a low battery/emotional exhaustion, difficulty connecting with emotions and a sense of pushing through History of Problem   Breahna suffers from polycystic ovarian syndrome. End of 2017/2018 she was taking different hormone based birth controls. 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 These lasted roughly two weeks. She reported that she woke up and cried the whole day for two weeks. She attended therapy during this time for about two months. She also began taking antidepressant medication. She also attended therapy in 2012 following a bad breakup. She first began taking antidepressants in 2018, but reported a history of taking and stopping them. She has taken Lexapro, buspar, trazodone, and has spent the most time taking citalopram . She reported some fear of taking new medication due to her experiences with an uncle who took Wellbutrin and had a terrible suicidal ideation episode that resulted in him going missing for three days. She shared that she has similar medication reactions to her mother.  She was not taking any prescription medications at time of intake. She is prescribed zetbound and Vyvanse  but has not been able to access Vyvanse  due to the shortage and has not been actively taking either. Ruqayya revealed that she had caught her father stalking her home 6 months prior to his death. Recent Trigger     Marital and Family Information  She and her wife had been married for over 8 years, and together for 10 years prior to divorcing. She shared that she had a high school relationship that ended prior to college, several relationships in college, and met her wife and married her by 29.    Present family concerns/problems: Enna is very close with her mother. She has a sister who is 17 years older with whom she has a good relationship. Her father died in 18-Sep-2024. She has extended family  she sees on holidays. She has not communicated with anyone on her father's side for ten years except for one uncle. She described her father as an addict since before she was born. He underwent a quadruple bypass when Irelyn was 10, and became addicted to prescription painkillers following this. She described him as manipulative and narcisstic toward people he was close  with. She reported frequent drama in family relationships having to do with her father's addictions, and aspects of her childhood as chaotic. She shared that she had been daddy's little girl prior to maturing and becoming more aware of his challenges. She shared that she had only spoken with her father twice in the 10 years leading to his death. She also reported writing him a long letter that he did not respond to, which was a hurtful experience for her. He reportedly died as a result of a heart attack three weeks after the death of Santresa's grandmother, who died following a fall.   Strengths/resources in the family/friends:  She shared that she participates in an active friend community online. She has met and planned trips with several  people from the group. She is still close with two of her best friends from childhood. She also maintains a close relationship with her older sister and several work friends. However, she described herself as less social than pre-COVID. Since her divorce, Cassandra Ibarra has been involved in an online romantic relationship since late 2024.    Family background / ethnic factors: Willo shared that her wife was a cisgendered female for the beginning of their relationship, but transitioned to female in 2019. She described this as very difficult for her, and that there were no warning signs. She shared that prior to the transition, her wife had been very masculine, and described the experience as being like my husband died. Looking back, she wonders if they should have ended the relationship at that time. Havah shard that her wife has a history of hiding the truth not to hurt people, but eventually revealed that she was no longer attracted to women. She reported that this revelation had prompted her to grieve her husband and earlier stages of the relationship, but not her wife.    No needs/concerns related to ethnicity reported when asked: No  Education/Vocation   Interpersonal concerns/problems: None. 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 Personal strengths: Mahina shared that she is continuing to function despite challenges, she actively looks for the positive. In the year since starting therapy, she has made significant strides toward establishing healthy habits, including showering regularly, initiating a skincare routine, meal-prepping, cleaning her home, and losing 60 lbs. Military/work problems/concerns: None  Leisure Activities/Daily Functioning : no changes noted. Enjoys online social activities, including talking and gaming Legal Status  No Legal Problems: No Medical/Nutritional Concerns   Comments: polycystic ovarian syndrome, ADHD,  fibromyalgia, questions whether she may be on the spectrum   Substance use/abuse/dependence: Kellee described herself as a social drinker, who drinks occasionally (every other week). She has previously dabbled with edibles, but found it difficult to find a good dose.    Comments:    Religion/Spirituality: Mintie described herself as leaning toward agnosticism. She suspects she may have religious trauma from childhood.    General Behavior: WNL Attire: WNL Gait: not observed-telehealth Motor Activity: WNL Stream of Thought - Productivity: WNL Stream of thought - Progression: WNL Stream of thought - Language:  WNL Emotional tone and reactions - Mood: WNL  Emotional tone and reactions - Affect: WNL Mental trend/Content of thoughts - Perception: WNL Mental trend/Content of thoughts - Orientation: WNL Mental trend/Content of thoughts - Memory: WNL Mental trend/Content of thoughts - General knowledge: WNL Insight: WNL Judgment: WNL Intelligence: WNL Mental Status Comment: WNL Diagnostic Summary Major Depression, Recurrent, Moderate (F33.2), Post Traumatic Stress disorder, chronic (  F43.12)    Andriette LITTIE Ponto, PhD               Andriette LITTIE Ponto, PhD

## 2024-10-06 ENCOUNTER — Other Ambulatory Visit (HOSPITAL_COMMUNITY): Payer: Self-pay

## 2024-10-06 NOTE — Telephone Encounter (Signed)
 Pt is sending in message inquiring about her Vyvanse  PA. I see that there was a message regarding a denial because there were other medications that the pts insurance covered and that they should be considered for treatment. In that list, the generic form of Vyvanse  is listed, which is Lisdexamphetamine. This is what was sent out on the prescription. Can we Appeal the denial or can this be looked into? This has happened one other time and they insurance ended up approving it

## 2024-10-07 ENCOUNTER — Other Ambulatory Visit (HOSPITAL_COMMUNITY): Payer: Self-pay

## 2024-10-07 NOTE — Telephone Encounter (Signed)
 Noted.

## 2024-10-07 NOTE — Telephone Encounter (Signed)
 We have not received any denials for Vyvanse . Original encounter was asking for clarification on tried therapies per the PA form questions. Generic PA was updated and submitted. Status is pending.

## 2024-10-19 ENCOUNTER — Ambulatory Visit: Admitting: Clinical

## 2024-11-02 ENCOUNTER — Ambulatory Visit: Admitting: Clinical

## 2024-11-07 ENCOUNTER — Ambulatory Visit: Admitting: Family Medicine

## 2024-11-16 ENCOUNTER — Ambulatory Visit: Admitting: Clinical
